# Patient Record
Sex: Female | Born: 1980 | Race: Black or African American | Hispanic: No | Marital: Single | Smoking: Never smoker
Health system: Southern US, Community
[De-identification: ages and names within clinical notes are randomized; demographics above are authoritative.]

## PROBLEM LIST (undated history)

## (undated) ENCOUNTER — Inpatient Hospital Stay (HOSPITAL_COMMUNITY): Payer: Self-pay

## (undated) DIAGNOSIS — A599 Trichomoniasis, unspecified: Secondary | ICD-10-CM

## (undated) DIAGNOSIS — Z789 Other specified health status: Secondary | ICD-10-CM

## (undated) DIAGNOSIS — I10 Essential (primary) hypertension: Secondary | ICD-10-CM

## (undated) DIAGNOSIS — R87619 Unspecified abnormal cytological findings in specimens from cervix uteri: Secondary | ICD-10-CM

## (undated) DIAGNOSIS — IMO0002 Reserved for concepts with insufficient information to code with codable children: Secondary | ICD-10-CM

## (undated) DIAGNOSIS — D219 Benign neoplasm of connective and other soft tissue, unspecified: Secondary | ICD-10-CM

## (undated) DIAGNOSIS — A749 Chlamydial infection, unspecified: Secondary | ICD-10-CM

## (undated) DIAGNOSIS — R87629 Unspecified abnormal cytological findings in specimens from vagina: Secondary | ICD-10-CM

## (undated) HISTORY — DX: Essential (primary) hypertension: I10

## (undated) HISTORY — DX: Unspecified abnormal cytological findings in specimens from vagina: R87.629

## (undated) HISTORY — DX: Benign neoplasm of connective and other soft tissue, unspecified: D21.9

---
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
## 2001-01-31 ENCOUNTER — Encounter: Payer: Self-pay | Admitting: Obstetrics and Gynecology

## 2001-01-31 ENCOUNTER — Ambulatory Visit (HOSPITAL_COMMUNITY): Admission: RE | Admit: 2001-01-31 | Discharge: 2001-01-31 | Payer: Self-pay | Admitting: Obstetrics and Gynecology

## 2001-03-27 ENCOUNTER — Other Ambulatory Visit: Admission: RE | Admit: 2001-03-27 | Discharge: 2001-03-27 | Payer: Self-pay | Admitting: Obstetrics and Gynecology

## 2001-06-25 ENCOUNTER — Inpatient Hospital Stay (HOSPITAL_COMMUNITY): Admission: AD | Admit: 2001-06-25 | Discharge: 2001-06-25 | Payer: Self-pay | Admitting: *Deleted

## 2001-06-29 ENCOUNTER — Inpatient Hospital Stay (HOSPITAL_COMMUNITY): Admission: AD | Admit: 2001-06-29 | Discharge: 2001-07-02 | Payer: Self-pay | Admitting: Obstetrics and Gynecology

## 2001-08-12 ENCOUNTER — Other Ambulatory Visit: Admission: RE | Admit: 2001-08-12 | Discharge: 2001-08-12 | Payer: Self-pay | Admitting: Obstetrics and Gynecology

## 2002-07-27 ENCOUNTER — Other Ambulatory Visit: Admission: RE | Admit: 2002-07-27 | Discharge: 2002-07-27 | Payer: Self-pay | Admitting: Obstetrics and Gynecology

## 2002-12-24 ENCOUNTER — Other Ambulatory Visit: Admission: RE | Admit: 2002-12-24 | Discharge: 2002-12-24 | Payer: Self-pay | Admitting: Gynecology

## 2003-01-24 ENCOUNTER — Inpatient Hospital Stay (HOSPITAL_COMMUNITY): Admission: AD | Admit: 2003-01-24 | Discharge: 2003-01-24 | Payer: Self-pay | Admitting: Family Medicine

## 2003-02-02 ENCOUNTER — Encounter: Admission: RE | Admit: 2003-02-02 | Discharge: 2003-02-02 | Payer: Self-pay | Admitting: *Deleted

## 2003-02-09 ENCOUNTER — Encounter: Admission: RE | Admit: 2003-02-09 | Discharge: 2003-02-09 | Payer: Self-pay | Admitting: *Deleted

## 2003-02-15 ENCOUNTER — Ambulatory Visit (HOSPITAL_COMMUNITY): Admission: RE | Admit: 2003-02-15 | Discharge: 2003-02-15 | Payer: Self-pay | Admitting: *Deleted

## 2003-02-23 ENCOUNTER — Encounter: Admission: RE | Admit: 2003-02-23 | Discharge: 2003-02-23 | Payer: Self-pay | Admitting: *Deleted

## 2003-03-02 ENCOUNTER — Inpatient Hospital Stay (HOSPITAL_COMMUNITY): Admission: AD | Admit: 2003-03-02 | Discharge: 2003-03-06 | Payer: Self-pay | Admitting: *Deleted

## 2003-03-02 ENCOUNTER — Encounter: Admission: RE | Admit: 2003-03-02 | Discharge: 2003-03-02 | Payer: Self-pay | Admitting: *Deleted

## 2003-09-29 ENCOUNTER — Emergency Department (HOSPITAL_COMMUNITY): Admission: EM | Admit: 2003-09-29 | Discharge: 2003-09-29 | Payer: Self-pay | Admitting: Emergency Medicine

## 2004-09-26 ENCOUNTER — Emergency Department (HOSPITAL_COMMUNITY): Admission: EM | Admit: 2004-09-26 | Discharge: 2004-09-27 | Payer: Self-pay | Admitting: Emergency Medicine

## 2005-08-08 ENCOUNTER — Ambulatory Visit (HOSPITAL_COMMUNITY): Admission: RE | Admit: 2005-08-08 | Discharge: 2005-08-08 | Payer: Self-pay | Admitting: Obstetrics & Gynecology

## 2006-01-09 ENCOUNTER — Inpatient Hospital Stay (HOSPITAL_COMMUNITY): Admission: AD | Admit: 2006-01-09 | Discharge: 2006-01-10 | Payer: Self-pay | Admitting: Obstetrics & Gynecology

## 2006-01-19 ENCOUNTER — Inpatient Hospital Stay (HOSPITAL_COMMUNITY): Admission: AD | Admit: 2006-01-19 | Discharge: 2006-01-21 | Payer: Self-pay | Admitting: Obstetrics & Gynecology

## 2012-10-15 NOTE — L&D Delivery Note (Signed)
I checked on the patient after I assumed care this morning. Upon my arrival, a Foley has been placed and drained more than a liter. The FHR had been reassuring but then dropped to the 60s and did not increase. I placed a scalp electrode to confirm the FHR. I then removed it and notified the OR and NICU. I placed a Kiwi vaccuum and delivered the baby girl after cutting a 3rd degree episiotomy. The Apgars and weight are pending. After delivery of the placenta, she had a EBL of a liter and the PPH protocol was begun. The bleeding subsided quickly. Her uterus was explored manually and noted to be empty. I repaired the 3rd degree in the usual fashion after assuring the the rectal mucosa was intact. 3-0 vicryl suture was used. She tolerated the procedure well. I will transfuse her 2 units of PRBCs.

## 2012-10-29 ENCOUNTER — Emergency Department (HOSPITAL_COMMUNITY)
Admission: EM | Admit: 2012-10-29 | Discharge: 2012-10-30 | Disposition: A | Discharge status: Home / Self Care | Payer: Self-pay | Attending: Emergency Medicine | Admitting: Emergency Medicine

## 2012-10-29 ENCOUNTER — Encounter (HOSPITAL_COMMUNITY): Payer: Self-pay | Admitting: *Deleted

## 2012-10-29 DIAGNOSIS — Z3490 Encounter for supervision of normal pregnancy, unspecified, unspecified trimester: Secondary | ICD-10-CM

## 2012-10-29 DIAGNOSIS — O209 Hemorrhage in early pregnancy, unspecified: Secondary | ICD-10-CM | POA: Insufficient documentation

## 2012-10-29 LAB — CBC
Hemoglobin: 10.4 g/dL — ABNORMAL LOW (ref 12.0–15.0)
MCH: 25.2 pg — ABNORMAL LOW (ref 26.0–34.0)
MCHC: 32.8 g/dL (ref 30.0–36.0)
MCV: 76.8 fL — ABNORMAL LOW (ref 78.0–100.0)
RBC: 4.13 MIL/uL (ref 3.87–5.11)

## 2012-10-29 LAB — URINALYSIS, ROUTINE W REFLEX MICROSCOPIC
Bilirubin Urine: NEGATIVE
Glucose, UA: NEGATIVE mg/dL
Ketones, ur: NEGATIVE mg/dL
Protein, ur: NEGATIVE mg/dL

## 2012-10-29 LAB — PREGNANCY, URINE: Preg Test, Ur: POSITIVE — AB

## 2012-10-29 LAB — URINE MICROSCOPIC-ADD ON

## 2012-10-29 NOTE — ED Notes (Signed)
Pt states positive pregnancy test Saturday; lmp beginning of December; continued cramping since last Wed; started spotting on Monday; heavy bleeding with possible clots today

## 2012-10-30 ENCOUNTER — Emergency Department (HOSPITAL_COMMUNITY): Payer: Self-pay

## 2012-10-30 LAB — WET PREP, GENITAL
Clue Cells Wet Prep HPF POC: NONE SEEN
Yeast Wet Prep HPF POC: NONE SEEN

## 2012-10-30 LAB — ABO/RH: ABO/RH(D): O POS

## 2012-10-30 LAB — TYPE AND SCREEN: Antibody Screen: NEGATIVE

## 2012-10-30 NOTE — ED Notes (Signed)
Pt transported to ultrasound per wheelchair

## 2012-10-30 NOTE — ED Provider Notes (Signed)
History     CSN: [id number]  Arrival date & time 10/29/12  2140   First MD Initiated Contact with Patient 10/29/12 2306      Chief Complaint  Patient presents with  . Vaginal Bleeding    (Consider location/radiation/quality/duration/timing/severity/associated sxs/prior treatment) HPI History provided by pt.   Pt developed lower abdominal cramping and vaginal spotting 1 week ago.  She thought her period was starting but bleeding continued to be minimal.  Took a positive pregnancy test over the weekend.  Today the abdominal cramping improved, but bleeding became heavy.  No other recent GU sx but has had N/V, daily, diffuse headaches, constant lightheadedness and intermittent SOB since onset of vaginal bleeding.  SOB is not necessarily exertional and denies CP, cough and fever.  No prior h/o ectopic.   History reviewed. No pertinent past medical history.  History reviewed. No pertinent past surgical history.  No family history on file.  History  Substance Use Topics  . Smoking status: Never Smoker   . Smokeless tobacco: Not on file  . Alcohol Use: No     Comment: social    OB History    Grav Para Term Preterm Abortions TAB SAB Ect Mult Living   1               Review of Systems  All other systems reviewed and are negative.    Allergies  Review of patient's allergies indicates no known allergies.  Home Medications   Current Outpatient Rx  Name  Route  Sig  Dispense  Refill  . IBUPROFEN 200 MG PO TABS   Oral   Take 200 mg by mouth every 8 (eight) hours as needed. For pain.           BP 137/80  Pulse 108  Temp 98.8 F (37.1 C) (Oral)  Resp 17  SpO2 100%  LMP 09/14/2012  Physical Exam  Nursing note and vitals reviewed. Constitutional: She is oriented to person, place, and time. She appears well-developed and well-nourished. No distress.  HENT:  Head: Normocephalic and atraumatic.  Eyes:       Normal appearance  Neck: Normal range of motion.    Cardiovascular: Normal rate and regular rhythm.   Pulmonary/Chest: Effort normal and breath sounds normal. No respiratory distress.  Abdominal: Soft. Bowel sounds are normal. She exhibits no distension.       Diffuse lower abd and suprapubic ttp  Genitourinary:       Nml external genitalia.  Physiologic vaginal discharge; no bleeding.  Cervix closed and appears nml.  Mild, bilateral adnexal tenderness and no cervical motion tenderness.   Musculoskeletal: Normal range of motion.  Neurological: She is alert and oriented to person, place, and time.  Skin: Skin is warm and dry. No rash noted.  Psychiatric: She has a normal mood and affect. Her behavior is normal.    ED Course  Procedures (including critical care time)   Date: 10/30/2012  Rate: 102  Rhythm: sinus tachycardia  QRS Axis: normal  Intervals: normal  ST/T Wave abnormalities: normal  Conduction Disutrbances:none  Narrative Interpretation:   Old EKG Reviewed: none available   Labs Reviewed  PREGNANCY, URINE - Abnormal; Notable for the following:    Preg Test, Ur POSITIVE (*)     All other components within normal limits  URINALYSIS, ROUTINE W REFLEX MICROSCOPIC - Abnormal; Notable for the following:    APPearance CLOUDY (*)     Leukocytes, UA SMALL (*)     All  other components within normal limits  HCG, QUANTITATIVE, PREGNANCY - Abnormal; Notable for the following:    hCG, Beta Chain, Quant, S 75280 (*)     All other components within normal limits  CBC - Abnormal; Notable for the following:    Hemoglobin 10.4 (*)     HCT 31.7 (*)     MCV 76.8 (*)     MCH 25.2 (*)     RDW 16.8 (*)     All other components within normal limits  WET PREP, GENITAL - Abnormal; Notable for the following:    Trich, Wet Prep MODERATE (*)     WBC, Wet Prep HPF POC FEW (*)     All other components within normal limits  ABO/RH  TYPE AND SCREEN  URINE MICROSCOPIC-ADD ON  GC/CHLAMYDIA PROBE AMP   US Ob Comp Less 14 Wks  10/30/2012   *RADIOLOGY REPORT*  Clinical Data: Vaginal bleeding.  Pregnant.  TRANSVAGINAL OB ULTRASOUND,ULTRASOUND OB COMPLETE LESS 14 WEEKS ADD'L GEST,OBSTETRIC <14 WK ULTRASOUND  Technique:  Both transabdominal and transvaginal ultrasound examinations were performed for complete evaluation of the gestation as well as the maternal uterus, adnexal regions, and pelvic cul-de-sac.  Transvaginal technique was performed to assess early pregnancy.  Comparison:  None.  Intrauterine gestational sac:  There are two, labeled A on the right and B on the left.  Baby A: Yolk sac: Present Embryo: Visualized Cardiac Activity: Visualized Heart Rate: 127 bpm CRL: 4.1  mm  6 w  1 d        [place] EDC: 06/24/2013  Baby B: Yolk sac: Present Embryo: Visualized Cardiac Activity: Visualized Heart Rate: 123   bpm CRL:  4.1 mm  6 w  1 d        [place] EDC: 06/24/2013  Maternal uterus/Adnexae: Small subchorionic hemorrhage suspected.  Right ovary 31 x 43 mm with a probable corpus luteum cyst.  Left ovary 24 x 26 x 42 mm, unremarkable. No free fluid.  IMPRESSION:  Multiple gestation as above, with small subchorionic hemorrhage.  Maternal uterus/adnexae:  IMPRESSION:   Original Report Authenticated By: D. Andria Rhein, MD    US Ob Comp Addl Gest Less 14 Wks  10/30/2012  *RADIOLOGY REPORT*  Clinical Data: Vaginal bleeding.  Pregnant.  TRANSVAGINAL OB ULTRASOUND,ULTRASOUND OB COMPLETE LESS 14 WEEKS ADD'L GEST,OBSTETRIC <14 WK ULTRASOUND  Technique:  Both transabdominal and transvaginal ultrasound examinations were performed for complete evaluation of the gestation as well as the maternal uterus, adnexal regions, and pelvic cul-de-sac.  Transvaginal technique was performed to assess early pregnancy.  Comparison:  None.  Intrauterine gestational sac:  There are two, labeled A on the right and B on the left.  Baby A: Yolk sac: Present Embryo: Visualized Cardiac Activity: Visualized Heart Rate: 127 bpm CRL: 4.1  mm  6 w  1 d        [place] EDC: 06/24/2013  Baby B: Yolk sac:  Present Embryo: Visualized Cardiac Activity: Visualized Heart Rate: 123   bpm CRL:  4.1 mm  6 w  1 d        [place] EDC: 06/24/2013  Maternal uterus/Adnexae: Small subchorionic hemorrhage suspected.  Right ovary 31 x 43 mm with a probable corpus luteum cyst.  Left ovary 24 x 26 x 42 mm, unremarkable. No free fluid.  IMPRESSION:  Multiple gestation as above, with small subchorionic hemorrhage.  Maternal uterus/adnexae:  IMPRESSION:   Original Report Authenticated By: D. Andria Rhein, MD    US Ob Transvaginal  10/30/2012  *RADIOLOGY REPORT*  Clinical Data: Vaginal bleeding.  Pregnant.  TRANSVAGINAL OB ULTRASOUND,ULTRASOUND OB COMPLETE LESS 14 WEEKS ADD'L GEST,OBSTETRIC <14 WK ULTRASOUND  Technique:  Both transabdominal and transvaginal ultrasound examinations were performed for complete evaluation of the gestation as well as the maternal uterus, adnexal regions, and pelvic cul-de-sac.  Transvaginal technique was performed to assess early pregnancy.  Comparison:  None.  Intrauterine gestational sac:  There are two, labeled A on the right and B on the left.  Baby A: Yolk sac: Present Embryo: Visualized Cardiac Activity: Visualized Heart Rate: 127 bpm CRL: 4.1  mm  6 w  1 d        [place] EDC: 06/24/2013  Baby B: Yolk sac: Present Embryo: Visualized Cardiac Activity: Visualized Heart Rate: 123   bpm CRL:  4.1 mm  6 w  1 d        [place] EDC: 06/24/2013  Maternal uterus/Adnexae: Small subchorionic hemorrhage suspected.  Right ovary 31 x 43 mm with a probable corpus luteum cyst.  Left ovary 24 x 26 x 42 mm, unremarkable. No free fluid.  IMPRESSION:  Multiple gestation as above, with small subchorionic hemorrhage.  Maternal uterus/adnexae:  IMPRESSION:   Original Report Authenticated By: D. Andria Rhein, MD      1. Pregnancy       MDM  31yo pregnant F presents w/ vaginal bleeding and abdominal cramping.  Has had lightheadedness, SOB, headaches, N/V as well.  No vaginal bleeding and diffuse lower abd and bilateral adnexal ttp on  exam.  EKG non-ischemic and shows mild sinus tach.  Labs sig for hcg 75,280 and vaginal trich.  Will not treat trich because patient is asymptomatic and risk greater than benefit.  Transvaginal US pending.  12:10 AM   Vital signs have normalized.  US shows multiple gestation w/ possible small subchorionic hemorrhage.  Patient is aware.  I recommended close f/u with her OB.  Return precautions discussed.  She knows that [facility] has an ED.  2:04 AM         Otilio Miu, PA-C 10/30/12 [id number]

## 2012-10-31 LAB — GC/CHLAMYDIA PROBE AMP: GC Probe RNA: NEGATIVE

## 2012-11-01 NOTE — ED Notes (Signed)
+  Chlamydia. Chart sent to EDP office for review. DHHS attached. 

## 2012-11-03 NOTE — ED Provider Notes (Signed)
Medical screening examination/treatment/procedure(s) were performed by non-physician practitioner and as supervising physician I was immediately available for consultation/collaboration.   Richardean Canal, MD 11/03/12 1515

## 2012-11-04 NOTE — ED Notes (Signed)
Chart sent to EDP office for review. rx for Zithromax 2 gam po written by Jonathon Jordan.

## 2012-12-16 ENCOUNTER — Inpatient Hospital Stay (HOSPITAL_COMMUNITY)
Admission: AD | Admit: 2012-12-16 | Discharge: 2012-12-16 | Disposition: A | Discharge status: Home / Self Care | Payer: Self-pay | Source: Ambulatory Visit | Attending: Obstetrics & Gynecology | Admitting: Obstetrics & Gynecology

## 2012-12-16 ENCOUNTER — Encounter (HOSPITAL_COMMUNITY): Payer: Self-pay | Admitting: *Deleted

## 2012-12-16 ENCOUNTER — Inpatient Hospital Stay (HOSPITAL_COMMUNITY): Payer: Self-pay

## 2012-12-16 DIAGNOSIS — O30009 Twin pregnancy, unspecified number of placenta and unspecified number of amniotic sacs, unspecified trimester: Secondary | ICD-10-CM | POA: Insufficient documentation

## 2012-12-16 DIAGNOSIS — O418X20 Other specified disorders of amniotic fluid and membranes, second trimester, not applicable or unspecified: Secondary | ICD-10-CM

## 2012-12-16 DIAGNOSIS — M259 Joint disorder, unspecified: Secondary | ICD-10-CM | POA: Insufficient documentation

## 2012-12-16 DIAGNOSIS — N760 Acute vaginitis: Secondary | ICD-10-CM | POA: Insufficient documentation

## 2012-12-16 DIAGNOSIS — M5430 Sciatica, unspecified side: Secondary | ICD-10-CM | POA: Insufficient documentation

## 2012-12-16 DIAGNOSIS — M899 Disorder of bone, unspecified: Secondary | ICD-10-CM | POA: Insufficient documentation

## 2012-12-16 DIAGNOSIS — A499 Bacterial infection, unspecified: Secondary | ICD-10-CM | POA: Insufficient documentation

## 2012-12-16 DIAGNOSIS — A5901 Trichomonal vulvovaginitis: Secondary | ICD-10-CM | POA: Insufficient documentation

## 2012-12-16 DIAGNOSIS — O98819 Other maternal infectious and parasitic diseases complicating pregnancy, unspecified trimester: Secondary | ICD-10-CM | POA: Insufficient documentation

## 2012-12-16 DIAGNOSIS — M5432 Sciatica, left side: Secondary | ICD-10-CM

## 2012-12-16 DIAGNOSIS — O209 Hemorrhage in early pregnancy, unspecified: Secondary | ICD-10-CM | POA: Insufficient documentation

## 2012-12-16 DIAGNOSIS — O2390 Unspecified genitourinary tract infection in pregnancy, unspecified trimester: Secondary | ICD-10-CM | POA: Insufficient documentation

## 2012-12-16 DIAGNOSIS — B9689 Other specified bacterial agents as the cause of diseases classified elsewhere: Secondary | ICD-10-CM | POA: Insufficient documentation

## 2012-12-16 HISTORY — DX: Unspecified abnormal cytological findings in specimens from cervix uteri: R87.619

## 2012-12-16 HISTORY — DX: Reserved for concepts with insufficient information to code with codable children: IMO0002

## 2012-12-16 HISTORY — DX: Chlamydial infection, unspecified: A74.9

## 2012-12-16 HISTORY — DX: Trichomoniasis, unspecified: A59.9

## 2012-12-16 LAB — URINALYSIS, ROUTINE W REFLEX MICROSCOPIC
Bilirubin Urine: NEGATIVE
Glucose, UA: NEGATIVE mg/dL
Specific Gravity, Urine: 1.025 (ref 1.005–1.030)
pH: 6 (ref 5.0–8.0)

## 2012-12-16 LAB — URINE MICROSCOPIC-ADD ON

## 2012-12-16 LAB — WET PREP, GENITAL: Yeast Wet Prep HPF POC: NONE SEEN

## 2012-12-16 LAB — GC/CHLAMYDIA PROBE AMP: CT Probe RNA: NEGATIVE

## 2012-12-16 MED ORDER — CYCLOBENZAPRINE HCL 10 MG PO TABS: 10.0000 mg | ORAL_TABLET | Freq: Two times a day (BID) | ORAL | Status: DC | PRN

## 2012-12-16 MED ORDER — METRONIDAZOLE 500 MG PO TABS: 500.0000 mg | ORAL_TABLET | Freq: Two times a day (BID) | ORAL | Status: DC

## 2012-12-16 NOTE — MAU Provider Note (Signed)
  History     CSN: [id number]  Arrival date and time: 12/16/12 0705   None     Chief Complaint  Patient presents with  . Vaginal Bleeding   HPI  Pt is [id number] with twin gestation presents with complaint of bright red vaginal discharge noticed this morning.  She also had some cramping.  Pt denies pain with urination, constipation, diarrhea.  Pt had trichomonas when she was seen at [facility] ED but does not appear that she was treated. Pt was called after her visit and told she had chlamydia- which she took 4 pills for and has not had sex since. Pt did not know that she had trichomonas.  Pt also has left hip pain radiating down her leg.  Tylenol helps a little but makes her sleepy.  She works at [employer]. Pt is waiting on Medicaid - would like to come to [facility] clinic who took care of her last pregnancy.  Past Medical History  Diagnosis Date  . Abnormal Pap smear   . Chlamydia     Past Surgical History  Procedure Laterality Date  . Cesarean section      Family History  Problem Relation Age of Onset  . Kidney disease Mother   . Heart disease Son   . Diabetes Maternal Aunt     History  Substance Use Topics  . Smoking status: Never Smoker   . Smokeless tobacco: Never Used  . Alcohol Use: Yes     Comment: social    Allergies: No Known Allergies  No prescriptions prior to admission    Review of Systems  Constitutional: Negative for fever and chills.  Gastrointestinal: Negative for heartburn, nausea, vomiting, abdominal pain, diarrhea and constipation.  Genitourinary: Negative for dysuria and urgency.  Musculoskeletal: Positive for back pain.   Physical Exam   Blood pressure 130/77, pulse 116, temperature 99.2 F (37.3 C), temperature source Oral, resp. rate 18, height 5\' 9"  (1.753 m), weight 225 lb (102.059 kg), last menstrual period 10/01/2012.  Physical Exam  Nursing note and vitals reviewed. Constitutional: She is oriented to person, place, and time. She appears  well-developed and well-nourished. No distress.  HENT:  Head: Normocephalic.  Eyes: Pupils are equal, round, and reactive to light.  Neck: Normal range of motion. Neck supple.  Cardiovascular: Normal rate.   Respiratory: Effort normal.  GI: Soft. There is no tenderness. There is no rebound and no guarding.  Genitourinary:  Mod amount of opaque pink vaginal discharge in vault; cervix clean, closed, NT; uterus difficult to evaluate due to habitus-NT  Musculoskeletal: Normal range of motion.  Neurological: She is alert and oriented to person, place, and time.  Skin: Skin is warm and dry.  Psychiatric: She has a normal mood and affect.    MAU Course  Procedures     Assessment and Plan  [facility] Trichomonas vaginitis and BV- prescription for Flagyl 500mg  BID for 7 days Sciatica- prescription for Flexeril 10 mg TID prn- stretching exercises F/u in [facility] OB clinic- clinic to call pt with an appointment  LINEBERRY,SUSAN 12/16/2012, 8:16 AM

## 2012-12-16 NOTE — MAU Note (Signed)
Noted bloody D/C when she woke up around 0530. Has had bleeding previously with this pregnancy. Not seen in hospital until now. No cramping.

## 2012-12-16 NOTE — MAU Note (Addendum)
Now states went to [facility] about 1 month ago for N/V. Had U/S, was given due date of 06/24/2013, Twins.

## 2012-12-23 ENCOUNTER — Other Ambulatory Visit: Payer: Self-pay

## 2012-12-30 ENCOUNTER — Other Ambulatory Visit: Payer: Self-pay

## 2012-12-31 ENCOUNTER — Other Ambulatory Visit: Payer: Self-pay

## 2012-12-31 DIAGNOSIS — Z3490 Encounter for supervision of normal pregnancy, unspecified, unspecified trimester: Secondary | ICD-10-CM

## 2013-01-01 ENCOUNTER — Encounter: Payer: Self-pay | Admitting: Family Medicine

## 2013-01-01 DIAGNOSIS — Z2839 Other underimmunization status: Secondary | ICD-10-CM | POA: Insufficient documentation

## 2013-01-01 DIAGNOSIS — D649 Anemia, unspecified: Secondary | ICD-10-CM | POA: Insufficient documentation

## 2013-01-01 DIAGNOSIS — Z283 Underimmunization status: Secondary | ICD-10-CM | POA: Insufficient documentation

## 2013-01-01 DIAGNOSIS — O9989 Other specified diseases and conditions complicating pregnancy, childbirth and the puerperium: Secondary | ICD-10-CM

## 2013-01-01 LAB — OBSTETRIC PANEL
Basophils Absolute: 0 10*3/uL (ref 0.0–0.1)
Hepatitis B Surface Ag: NEGATIVE
Lymphocytes Relative: 38 % (ref 12–46)
Lymphs Abs: 2.9 10*3/uL (ref 0.7–4.0)
Neutro Abs: 4.1 10*3/uL (ref 1.7–7.7)
Neutrophils Relative %: 53 % (ref 43–77)
Platelets: 396 10*3/uL (ref 150–400)
RBC: 3.79 MIL/uL — ABNORMAL LOW (ref 3.87–5.11)
RDW: 18.4 % — ABNORMAL HIGH (ref 11.5–15.5)
Rubella: 8.26 Index — ABNORMAL HIGH (ref <0.90)
WBC: 7.7 10*3/uL (ref 4.0–10.5)

## 2013-01-01 LAB — HIV ANTIBODY (ROUTINE TESTING W REFLEX): HIV: NONREACTIVE

## 2013-01-02 LAB — HEMOGLOBINOPATHY EVALUATION
Hemoglobin Other: 0 %
Hgb F Quant: 0 % (ref 0.0–2.0)
Hgb S Quant: 0 %

## 2013-01-07 ENCOUNTER — Encounter: Payer: Self-pay | Admitting: Obstetrics and Gynecology

## 2013-01-07 ENCOUNTER — Encounter: Payer: Self-pay | Admitting: Advanced Practice Midwife

## 2013-01-21 ENCOUNTER — Encounter (HOSPITAL_COMMUNITY): Payer: Self-pay | Admitting: *Deleted

## 2013-01-21 ENCOUNTER — Inpatient Hospital Stay (HOSPITAL_COMMUNITY)
Admission: AD | Admit: 2013-01-21 | Discharge: 2013-01-21 | Disposition: A | Discharge status: Home / Self Care | Payer: Medicaid Other | Source: Ambulatory Visit | Attending: Obstetrics & Gynecology | Admitting: Obstetrics & Gynecology

## 2013-01-21 ENCOUNTER — Inpatient Hospital Stay (HOSPITAL_COMMUNITY): Payer: Medicaid Other

## 2013-01-21 ENCOUNTER — Encounter: Payer: Self-pay | Admitting: Obstetrics & Gynecology

## 2013-01-21 DIAGNOSIS — W010XXA Fall on same level from slipping, tripping and stumbling without subsequent striking against object, initial encounter: Secondary | ICD-10-CM | POA: Insufficient documentation

## 2013-01-21 DIAGNOSIS — O26899 Other specified pregnancy related conditions, unspecified trimester: Secondary | ICD-10-CM

## 2013-01-21 DIAGNOSIS — Z283 Underimmunization status: Secondary | ICD-10-CM

## 2013-01-21 DIAGNOSIS — Y9289 Other specified places as the place of occurrence of the external cause: Secondary | ICD-10-CM | POA: Insufficient documentation

## 2013-01-21 DIAGNOSIS — O30009 Twin pregnancy, unspecified number of placenta and unspecified number of amniotic sacs, unspecified trimester: Secondary | ICD-10-CM | POA: Insufficient documentation

## 2013-01-21 DIAGNOSIS — O9989 Other specified diseases and conditions complicating pregnancy, childbirth and the puerperium: Secondary | ICD-10-CM

## 2013-01-21 DIAGNOSIS — O0930 Supervision of pregnancy with insufficient antenatal care, unspecified trimester: Secondary | ICD-10-CM | POA: Insufficient documentation

## 2013-01-21 DIAGNOSIS — R109 Unspecified abdominal pain: Secondary | ICD-10-CM | POA: Insufficient documentation

## 2013-01-21 DIAGNOSIS — O99891 Other specified diseases and conditions complicating pregnancy: Secondary | ICD-10-CM | POA: Insufficient documentation

## 2013-01-21 LAB — URINALYSIS, ROUTINE W REFLEX MICROSCOPIC
Hgb urine dipstick: NEGATIVE
Nitrite: NEGATIVE
Protein, ur: NEGATIVE mg/dL
Urobilinogen, UA: 0.2 mg/dL (ref 0.0–1.0)

## 2013-01-21 LAB — URINE MICROSCOPIC-ADD ON

## 2013-01-21 NOTE — MAU Note (Signed)
Patient states she slipped getting into her SUV and hit her abdomen on the seat control. Started having abdominal pain after. Denies bleeding or vaginal discharge. Has had no prenatal care yet. Has an appointment at the [facility] on 4-21.

## 2013-01-21 NOTE — MAU Note (Signed)
Pt states today about 1500 she was getting in the SUV when her foot slid off of stepboard and fell against controls on side of seat to abd.  Pt started having abd pain about 10 min later only on rt side of abd.  Denies vaginal bleeding or ROM.  Pt hasn't been feeling any fetal movement even prior to slip today.

## 2013-01-21 NOTE — MAU Provider Note (Signed)
  History     CSN: [id number]  Arrival date and time: 01/21/13 1750   First Provider Initiated Contact with Patient 01/21/13 1916      Chief Complaint  Patient presents with  . Fall   HPI  Pt is a [id number] at 18.0 wks IUP here with report of falling today.  Reports while stepping into car foot slipped and abdomen struck the side of chair.  No report of vaginal bleeding or leaking of fluid.  Slight abdominal tenderness.  First prenatal care appt is on 02/02/13.  Currently pregnancy with di/di twins.      Past Medical History  Diagnosis Date  . Abnormal Pap smear   . Chlamydia   . Trichomonas     January 2014  . Sciatica     Past Surgical History  Procedure Laterality Date  . Cesarean section      Family History  Problem Relation Age of Onset  . Kidney disease Mother   . Heart disease Son   . Diabetes Maternal Aunt     History  Substance Use Topics  . Smoking status: Never Smoker   . Smokeless tobacco: Never Used  . Alcohol Use: No     Comment: social    Allergies: No Known Allergies  Prescriptions prior to admission  Medication Sig Dispense Refill  . cyclobenzaprine (FLEXERIL) 10 MG tablet Take 1 tablet (10 mg total) by mouth 2 (two) times daily as needed for muscle spasms.  20 tablet  0  . metroNIDAZOLE (FLAGYL) 500 MG tablet Take 1 tablet (500 mg total) by mouth 2 (two) times daily.  14 tablet  0    Review of Systems  Gastrointestinal: Positive for abdominal pain (slight abdominal tenderness).  All other systems reviewed and are negative.   Physical Exam   Blood pressure 121/80, temperature 99 F (37.2 C), temperature source Oral, resp. rate 20, height 5' 7.5" (1.715 m), weight 103.874 kg (229 lb), last menstrual period 10/01/2012, SpO2 100.00%.  Physical Exam  Constitutional: She is oriented to person, place, and time. She appears well-developed and well-nourished.  HENT:  Head: Normocephalic.  Neck: Normal range of motion. Neck supple.   Cardiovascular: Normal rate, regular rhythm and normal heart sounds.   Respiratory: Effort normal and breath sounds normal.  GI: Soft. There is no tenderness (mild generalized tenderness).  Genitourinary: No bleeding around the vagina. Vaginal discharge (mucusy) found.  Neurological: She is alert and oriented to person, place, and time.  Skin: Skin is warm and dry.   Dilation: Closed Effacement (%): Thick Cervical Position: Posterior Exam by:: Roney Marion, CNM   MAU Course  Procedures    Assessment and Plan  Report given to H. Mathews Robinsons who assumes care of patient.  [facility] 01/21/2013, 7:17 PM

## 2013-01-21 NOTE — MAU Note (Signed)
Patient is having twins. 

## 2013-02-02 ENCOUNTER — Ambulatory Visit (INDEPENDENT_AMBULATORY_CARE_PROVIDER_SITE_OTHER): Payer: Medicaid Other | Admitting: Obstetrics & Gynecology

## 2013-02-02 ENCOUNTER — Other Ambulatory Visit: Payer: Self-pay | Admitting: Obstetrics & Gynecology

## 2013-02-02 ENCOUNTER — Encounter: Payer: Self-pay | Admitting: Obstetrics & Gynecology

## 2013-02-02 VITALS — BP 144/91 | Temp 99.4°F | Wt 228.0 lb

## 2013-02-02 DIAGNOSIS — A499 Bacterial infection, unspecified: Secondary | ICD-10-CM

## 2013-02-02 DIAGNOSIS — Z283 Underimmunization status: Secondary | ICD-10-CM

## 2013-02-02 DIAGNOSIS — O139 Gestational [pregnancy-induced] hypertension without significant proteinuria, unspecified trimester: Secondary | ICD-10-CM

## 2013-02-02 DIAGNOSIS — B9689 Other specified bacterial agents as the cause of diseases classified elsewhere: Secondary | ICD-10-CM

## 2013-02-02 DIAGNOSIS — N760 Acute vaginitis: Secondary | ICD-10-CM

## 2013-02-02 DIAGNOSIS — O169 Unspecified maternal hypertension, unspecified trimester: Secondary | ICD-10-CM | POA: Insufficient documentation

## 2013-02-02 DIAGNOSIS — O30009 Twin pregnancy, unspecified number of placenta and unspecified number of amniotic sacs, unspecified trimester: Secondary | ICD-10-CM

## 2013-02-02 DIAGNOSIS — O9989 Other specified diseases and conditions complicating pregnancy, childbirth and the puerperium: Secondary | ICD-10-CM

## 2013-02-02 DIAGNOSIS — O162 Unspecified maternal hypertension, second trimester: Secondary | ICD-10-CM

## 2013-02-02 LAB — POCT URINALYSIS DIP (DEVICE)
Bilirubin Urine: NEGATIVE
Ketones, ur: NEGATIVE mg/dL
Leukocytes, UA: NEGATIVE
Specific Gravity, Urine: 1.02 (ref 1.005–1.030)
pH: 7 (ref 5.0–8.0)

## 2013-02-02 LAB — COMPREHENSIVE METABOLIC PANEL
ALT: 77 U/L — ABNORMAL HIGH (ref 0–35)
AST: 52 U/L — ABNORMAL HIGH (ref 0–37)
Chloride: 106 mEq/L (ref 96–112)
Creat: 0.5 mg/dL (ref 0.50–1.10)
Total Bilirubin: 0.2 mg/dL — ABNORMAL LOW (ref 0.3–1.2)

## 2013-02-02 LAB — CBC
HCT: 28 % — ABNORMAL LOW (ref 36.0–46.0)
MCH: 24.7 pg — ABNORMAL LOW (ref 26.0–34.0)
MCHC: 33.2 g/dL (ref 30.0–36.0)
MCV: 74.5 fL — ABNORMAL LOW (ref 78.0–100.0)
RDW: 19.3 % — ABNORMAL HIGH (ref 11.5–15.5)

## 2013-02-02 NOTE — Progress Notes (Signed)
  Subjective:    Meredith Chen is a [id number] [redacted]w[redacted]d with di/di twins being seen today for her first obstetrical visit.  Pregnancy history fully reviewed.  Patient reports no complaints.  Filed Vitals:   02/02/13 1311  BP: 144/91  Temp: 99.4 F (37.4 C)  Weight: 228 lb (103.42 kg)    HISTORY: OB History   Grav Para Term Preterm Abortions TAB SAB Ect Mult Living   5 3 3  1 1    3      # Outc Date GA Lbr Len/2nd Wgt Sex Del Anes PTL Lv   1 TAB 2001           2 TRM 9/02 [redacted]w[redacted]d  8lb4oz(3.742kg) F CS EPI  Yes   3 TRM 5/04 [redacted]w[redacted]d  7lb4oz(3.289kg) M SVD EPI  Yes   Comments: VBAC   4 TRM 4/07 [redacted]w[redacted]d 01:00 6lb7oz(2.92kg) M SVD None  Yes   5 CUR              Past Medical History  Diagnosis Date  . Abnormal Pap smear   . Chlamydia   . Trichomonas     January 2014   Past Surgical History  Procedure Laterality Date  . Cesarean section     Family History  Problem Relation Age of Onset  . Kidney disease Mother   . Heart disease Son   . Diabetes Maternal Aunt      Exam    Uterus:     Pelvic Exam:    Perineum: No Hemorrhoids, Normal Perineum   Vulva: normal   Vagina:  normal mucosa, curdlike discharge, wet prep done   Cervix: multiparous appearance and spotting after pap   Adnexa: normal adnexa and no mass, fullness, tenderness   Bony Pelvis: average  System: Breast:  normal appearance, no masses or tenderness   Skin: normal coloration and turgor, no rashes   Neurologic: oriented, normal   Extremities: normal strength, tone, and muscle mass   HEENT PERRLA   Mouth/Teeth mucous membranes moist, pharynx normal without lesions and dental hygiene good   Neck supple and no masses   Cardiovascular: regular rate and rhythm   Respiratory:  appears well, vitals normal, no respiratory distress, acyanotic, normal RR   Abdomen: soft, non-tender; bowel sounds normal; no masses,  no organomegaly   Urinary: urethral meatus normal    Assessment:    Pregnancy: [id number] Patient Active  Problem List  Diagnosis  . Rubella non-immune status, antepartum  . Anemia  . Di/Di twins, antepartum  . Elevated blood pressure complicating pregnancy, antepartum      Plan:     Prenatal labs reviewed; BP labs drawn Continue prenatal vitamins. Problem list reviewed and updated. Genetic Screening discussed Quad Screen: ordered. Ultrasound discussed; fetal survey: ordered. Follow up in 2 weeks.   Eliga Arvie A 02/02/2013

## 2013-02-02 NOTE — Patient Instructions (Signed)
Return to clinic for any obstetric concerns or go to MAU for evaluation  

## 2013-02-02 NOTE — Progress Notes (Signed)
Pulse- 117  Edema-feet  Pain-back  Pressure-vaginal  New ob packet given Weight gain of 25-35lb  Flu vaccine declined

## 2013-02-03 ENCOUNTER — Telehealth: Payer: Self-pay | Admitting: *Deleted

## 2013-02-03 ENCOUNTER — Encounter: Payer: Self-pay | Admitting: Obstetrics & Gynecology

## 2013-02-03 DIAGNOSIS — O26619 Liver and biliary tract disorders in pregnancy, unspecified trimester: Secondary | ICD-10-CM | POA: Insufficient documentation

## 2013-02-03 LAB — WET PREP, GENITAL: Yeast Wet Prep HPF POC: NONE SEEN

## 2013-02-03 LAB — CULTURE, OB URINE: Colony Count: NO GROWTH

## 2013-02-03 MED ORDER — METRONIDAZOLE 500 MG PO TABS: 500.0000 mg | ORAL_TABLET | Freq: Two times a day (BID) | ORAL | Status: AC

## 2013-02-03 NOTE — Telephone Encounter (Addendum)
Message copied by Jill Side on Tue Feb 03, 2013  2:44 PM ------      Message from: Jaynie Collins A      Created: Tue Feb 03, 2013  2:22 PM       Patient has significant BV, Metronidazole prescribed.  Please call to inform patient of results and to pick up prescription.       ------ Called pt and informed her of +BV requiring medication. She can pick up her Rx today. Pt also asked about additional tests performed yesterday in light of her elevated BP. I stated that I will contact Dr. Macon Large to review the results and then call her back. Pt voiced understanding.

## 2013-02-03 NOTE — Addendum Note (Signed)
Addended by: Jaynie Collins A on: 02/03/2013 02:21 PM   Modules accepted: Orders

## 2013-02-05 ENCOUNTER — Ambulatory Visit (HOSPITAL_COMMUNITY)
Admission: RE | Admit: 2013-02-05 | Discharge: 2013-02-05 | Disposition: A | Discharge status: Home / Self Care | Payer: Medicaid Other | Source: Ambulatory Visit | Attending: Obstetrics & Gynecology | Admitting: Obstetrics & Gynecology

## 2013-02-05 DIAGNOSIS — Z363 Encounter for antenatal screening for malformations: Secondary | ICD-10-CM | POA: Insufficient documentation

## 2013-02-05 DIAGNOSIS — O358XX0 Maternal care for other (suspected) fetal abnormality and damage, not applicable or unspecified: Secondary | ICD-10-CM | POA: Insufficient documentation

## 2013-02-05 DIAGNOSIS — Z1389 Encounter for screening for other disorder: Secondary | ICD-10-CM | POA: Insufficient documentation

## 2013-02-05 DIAGNOSIS — O30009 Twin pregnancy, unspecified number of placenta and unspecified number of amniotic sacs, unspecified trimester: Secondary | ICD-10-CM | POA: Insufficient documentation

## 2013-02-06 LAB — HEPATITIS PANEL, ACUTE
HCV Ab: NEGATIVE
Hep A IgM: NEGATIVE

## 2013-02-09 ENCOUNTER — Telehealth: Payer: Self-pay | Admitting: *Deleted

## 2013-02-09 ENCOUNTER — Encounter: Payer: Self-pay | Admitting: Obstetrics & Gynecology

## 2013-02-09 NOTE — Telephone Encounter (Signed)
Called pt and informed her of negative hepatitis panel labs. Also informed her of slightly abnormal liver function studies which will be re-checked @ next visit on 5/5. There are no additional tests needed prior to that date. Pt voiced understanding.

## 2013-02-09 NOTE — Telephone Encounter (Signed)
Message copied by Jill Side on Mon Feb 09, 2013  3:26 PM ------      Message from: Jaynie Collins A      Created: Mon Feb 09, 2013  1:06 PM       Negative hepatitis panel, will recheck LFTs at next visit and continue to monitor BP.  Please call to inform patient of results and recommendations.       ------

## 2013-02-11 ENCOUNTER — Encounter: Payer: Self-pay | Admitting: *Deleted

## 2013-02-11 ENCOUNTER — Encounter: Payer: Self-pay | Admitting: Obstetrics & Gynecology

## 2013-02-16 ENCOUNTER — Ambulatory Visit (INDEPENDENT_AMBULATORY_CARE_PROVIDER_SITE_OTHER): Payer: Medicaid Other | Admitting: Obstetrics and Gynecology

## 2013-02-16 ENCOUNTER — Other Ambulatory Visit: Payer: Self-pay | Admitting: Obstetrics and Gynecology

## 2013-02-16 ENCOUNTER — Encounter: Payer: Self-pay | Admitting: Obstetrics and Gynecology

## 2013-02-16 VITALS — BP 130/85 | Temp 97.1°F | Wt 230.7 lb

## 2013-02-16 DIAGNOSIS — O9989 Other specified diseases and conditions complicating pregnancy, childbirth and the puerperium: Secondary | ICD-10-CM

## 2013-02-16 DIAGNOSIS — O30009 Twin pregnancy, unspecified number of placenta and unspecified number of amniotic sacs, unspecified trimester: Secondary | ICD-10-CM

## 2013-02-16 DIAGNOSIS — Z283 Underimmunization status: Secondary | ICD-10-CM

## 2013-02-16 DIAGNOSIS — O139 Gestational [pregnancy-induced] hypertension without significant proteinuria, unspecified trimester: Secondary | ICD-10-CM

## 2013-02-16 DIAGNOSIS — O162 Unspecified maternal hypertension, second trimester: Secondary | ICD-10-CM

## 2013-02-16 DIAGNOSIS — R7989 Other specified abnormal findings of blood chemistry: Secondary | ICD-10-CM

## 2013-02-16 LAB — COMPREHENSIVE METABOLIC PANEL
ALT: 50 U/L — ABNORMAL HIGH (ref 0–35)
CO2: 20 mEq/L (ref 19–32)
Chloride: 105 mEq/L (ref 96–112)
Sodium: 135 mEq/L (ref 135–145)
Total Bilirubin: 0.2 mg/dL — ABNORMAL LOW (ref 0.3–1.2)
Total Protein: 6.9 g/dL (ref 6.0–8.3)

## 2013-02-16 LAB — POCT URINALYSIS DIP (DEVICE)
Bilirubin Urine: NEGATIVE
Ketones, ur: NEGATIVE mg/dL
Protein, ur: NEGATIVE mg/dL

## 2013-02-16 NOTE — Progress Notes (Signed)
Patient doing well without complaints. Discussed need to repeat LFTs today.

## 2013-02-17 ENCOUNTER — Telehealth: Payer: Self-pay | Admitting: *Deleted

## 2013-02-17 ENCOUNTER — Inpatient Hospital Stay (HOSPITAL_COMMUNITY)
Admission: AD | Admit: 2013-02-17 | Discharge: 2013-02-17 | Disposition: A | Discharge status: Home / Self Care | Payer: Medicaid Other | Source: Ambulatory Visit | Attending: Obstetrics & Gynecology | Admitting: Obstetrics & Gynecology

## 2013-02-17 ENCOUNTER — Encounter (HOSPITAL_COMMUNITY): Payer: Self-pay | Admitting: *Deleted

## 2013-02-17 DIAGNOSIS — O30009 Twin pregnancy, unspecified number of placenta and unspecified number of amniotic sacs, unspecified trimester: Secondary | ICD-10-CM | POA: Insufficient documentation

## 2013-02-17 DIAGNOSIS — O99891 Other specified diseases and conditions complicating pregnancy: Secondary | ICD-10-CM | POA: Insufficient documentation

## 2013-02-17 DIAGNOSIS — O26899 Other specified pregnancy related conditions, unspecified trimester: Secondary | ICD-10-CM

## 2013-02-17 DIAGNOSIS — R109 Unspecified abdominal pain: Secondary | ICD-10-CM | POA: Insufficient documentation

## 2013-02-17 LAB — URINALYSIS, ROUTINE W REFLEX MICROSCOPIC
Bilirubin Urine: NEGATIVE
Glucose, UA: NEGATIVE mg/dL
Ketones, ur: NEGATIVE mg/dL
Protein, ur: NEGATIVE mg/dL

## 2013-02-17 MED ORDER — PANTOPRAZOLE SODIUM 20 MG PO TBEC: 20.0000 mg | DELAYED_RELEASE_TABLET | Freq: Every day | ORAL | Status: DC

## 2013-02-17 MED ORDER — ONDANSETRON 8 MG PO TBDP
8.0000 mg | ORAL_TABLET | Freq: Once | ORAL | Status: AC
  Administered 2013-02-17: 8 mg via ORAL
  Filled 2013-02-17: qty 1

## 2013-02-17 MED ORDER — GI COCKTAIL ~~LOC~~
30.0000 mL | Freq: Three times a day (TID) | ORAL | Status: DC | PRN
  Administered 2013-02-17: 30 mL via ORAL
  Filled 2013-02-17: qty 30

## 2013-02-17 MED ORDER — ONDANSETRON 8 MG PO TBDP: 8.0000 mg | ORAL_TABLET | Freq: Once | ORAL | Status: DC

## 2013-02-17 MED ORDER — TRAMADOL HCL 50 MG PO TABS
100.0000 mg | ORAL_TABLET | Freq: Once | ORAL | Status: DC
  Filled 2013-02-17: qty 2

## 2013-02-17 NOTE — Telephone Encounter (Signed)
Called pt and left message on her personal voice mail that her recent lab tests are slowly becoming normal. We will repeat the test at her next visit on 03/02/13.

## 2013-02-17 NOTE — Telephone Encounter (Signed)
Message copied by Jill Side on Tue Feb 17, 2013 10:58 AM ------      Message from: CONSTANT, PEGGY      Created: Mon Feb 16, 2013  5:08 PM       Please inform patient that liver enzyme are slowly normalizing. We will repeat blood test at her next appointment            Peggy ------

## 2013-02-17 NOTE — MAU Provider Note (Signed)
  History     CSN: [id number]  Arrival date and time: 02/17/13 2027   First Provider Initiated Contact with Patient 02/17/13 2128      Chief Complaint  Patient presents with  . Abdominal Cramping   HPI Ms Pajak is a 32yo [id number] at 66.6wks with twins who presents for eval of diffuse abd pain that radiates around to back approx 1-2x/hr. Denies leak or bldg or dysuria. No N/V/D. On day 5 of Flagyl for BV.  OB History   Grav Para Term Preterm Abortions TAB SAB Ect Mult Living   5 3 3  1 1    3       Past Medical History  Diagnosis Date  . Abnormal Pap smear   . Chlamydia   . Trichomonas     January 2014    Past Surgical History  Procedure Laterality Date  . Cesarean section      Family History  Problem Relation Age of Onset  . Kidney disease Mother   . Heart disease Son   . Diabetes Maternal Aunt     History  Substance Use Topics  . Smoking status: Never Smoker   . Smokeless tobacco: Never Used  . Alcohol Use: No     Comment: social    Allergies: No Known Allergies  Prescriptions prior to admission  Medication Sig Dispense Refill  . metroNIDAZOLE (FLAGYL) 500 MG tablet Take 500 mg by mouth 2 (two) times daily.      . Prenatal Vit-Fe Fumarate-FA (PRENATAL VITAMINS PLUS) 27-1 MG TABS Take 1 tablet by mouth daily.        ROS Physical Exam   Blood pressure 128/70, pulse 117, temperature 98.9 F (37.2 C), resp. rate 20, height 5\' 8"  (1.727 m), weight 231 lb (104.781 kg), last menstrual period 10/01/2012.  Physical Exam  Constitutional: She is oriented to person, place, and time. She appears well-developed.  HENT:  Head: Normocephalic.  Neck: Normal range of motion.  Cardiovascular: Regular rhythm.   Sl tachycardia  Respiratory: Effort normal.  GI: Soft.  No ctx per toco  Genitourinary: Vagina normal.  Cx C/L  Musculoskeletal: Normal range of motion.  Neurological: She is alert and oriented to person, place, and time.  Skin: Skin is warm and dry.   Psychiatric: She has a normal mood and affect. Her behavior is normal. Thought content normal.   Urinalysis    Component Value Date/Time   COLORURINE YELLOW 02/17/2013 2045   APPEARANCEUR CLEAR 02/17/2013 2045   LABSPEC <1.005* 02/17/2013 2045   PHURINE 6.0 02/17/2013 2045   GLUCOSEU NEGATIVE 02/17/2013 2045   HGBUR NEGATIVE 02/17/2013 2045   BILIRUBINUR NEGATIVE 02/17/2013 2045   KETONESUR NEGATIVE 02/17/2013 2045   PROTEINUR NEGATIVE 02/17/2013 2045   UROBILINOGEN 0.2 02/17/2013 2045   NITRITE NEGATIVE 02/17/2013 2045   LEUKOCYTESUR LARGE* 02/17/2013 2045      MAU Course  Procedures  Offered Tramadol for pain- declined Just prior to d/c had episode of vomiting x 1 with streaks of blood; gave Zofran and GI cocktail while here  Assessment and Plan  IUP at 21.6wks Second tri abd pain Probable Mallory-Weiss tears  D/C home Rec Motrin 600mg  prn cramping until 30wks Rx Protonix 20mg  x 30d in case of ulcer  SHAW, KIMBERLY 02/17/2013, 9:31 PM

## 2013-02-17 NOTE — MAU Note (Signed)
Patient states she has been having abdominal pain that has been off and on all day.

## 2013-02-20 NOTE — MAU Provider Note (Signed)
Attestation of Attending Supervision of Advanced Practitioner (PA/CNM/NP): Evaluation and management procedures were performed by the Advanced Practitioner under my supervision and collaboration.  I have reviewed the Advanced Practitioner's note and chart, and I agree with the management and plan.  Monish Haliburton, MD, FACOG Attending Obstetrician & Gynecologist Faculty Practice, [facility]  

## 2013-03-02 ENCOUNTER — Ambulatory Visit (INDEPENDENT_AMBULATORY_CARE_PROVIDER_SITE_OTHER): Payer: Medicaid Other | Admitting: Obstetrics & Gynecology

## 2013-03-02 VITALS — BP 117/77 | Temp 97.7°F | Wt 227.0 lb

## 2013-03-02 DIAGNOSIS — O30009 Twin pregnancy, unspecified number of placenta and unspecified number of amniotic sacs, unspecified trimester: Secondary | ICD-10-CM

## 2013-03-02 DIAGNOSIS — R7989 Other specified abnormal findings of blood chemistry: Secondary | ICD-10-CM

## 2013-03-02 DIAGNOSIS — O162 Unspecified maternal hypertension, second trimester: Secondary | ICD-10-CM

## 2013-03-02 DIAGNOSIS — O139 Gestational [pregnancy-induced] hypertension without significant proteinuria, unspecified trimester: Secondary | ICD-10-CM

## 2013-03-02 LAB — COMPREHENSIVE METABOLIC PANEL
ALT: 87 U/L — ABNORMAL HIGH (ref 0–35)
AST: 40 U/L — ABNORMAL HIGH (ref 0–37)
Albumin: 3.6 g/dL (ref 3.5–5.2)
BUN: 7 mg/dL (ref 6–23)
Calcium: 8.7 mg/dL (ref 8.4–10.5)
Chloride: 107 mEq/L (ref 96–112)
Potassium: 3.9 mEq/L (ref 3.5–5.3)
Sodium: 136 mEq/L (ref 135–145)
Total Protein: 6.9 g/dL (ref 6.0–8.3)

## 2013-03-02 LAB — POCT URINALYSIS DIP (DEVICE)
Bilirubin Urine: NEGATIVE
Glucose, UA: NEGATIVE mg/dL
Nitrite: NEGATIVE

## 2013-03-02 NOTE — Progress Notes (Signed)
LFTs rechecked today; will follow up results and manage accordingly.  Growth ultrasound to be scheduled next week.  BP normal.  BP and OB precautions reviewed.

## 2013-03-02 NOTE — Progress Notes (Signed)
U/S scheduled 03/06/13 at 245 pm.

## 2013-03-02 NOTE — Progress Notes (Signed)
Pulse 80  Edema trace in feet.

## 2013-03-02 NOTE — Patient Instructions (Signed)
Return to clinic for any obstetric concerns or go to MAU for evaluation  

## 2013-03-03 ENCOUNTER — Encounter: Payer: Self-pay | Admitting: Obstetrics & Gynecology

## 2013-03-04 ENCOUNTER — Telehealth: Payer: Self-pay | Admitting: General Practice

## 2013-03-04 NOTE — Telephone Encounter (Signed)
Message copied by Kathee Delton on Wed Mar 04, 2013  8:58 AM ------      Message from: Jaynie Collins A      Created: Tue Mar 03, 2013 10:20 PM       LFTs still elevated, will need recheck next visit.  Will also check bile acids and PTT next visit. Please call to inform patient of results and recommendations.       ------

## 2013-03-04 NOTE — Telephone Encounter (Signed)
Called patient and informed her of results and next appt of 6/2 at 8am. Patient verbalized understanding and had no further questions

## 2013-03-06 ENCOUNTER — Ambulatory Visit (HOSPITAL_COMMUNITY)
Admission: RE | Admit: 2013-03-06 | Discharge: 2013-03-06 | Disposition: A | Discharge status: Home / Self Care | Payer: Medicaid Other | Source: Ambulatory Visit | Attending: Obstetrics & Gynecology | Admitting: Obstetrics & Gynecology

## 2013-03-06 DIAGNOSIS — O26612 Liver and biliary tract disorders in pregnancy, second trimester: Secondary | ICD-10-CM

## 2013-03-06 DIAGNOSIS — Z283 Underimmunization status: Secondary | ICD-10-CM

## 2013-03-06 DIAGNOSIS — O30009 Twin pregnancy, unspecified number of placenta and unspecified number of amniotic sacs, unspecified trimester: Secondary | ICD-10-CM | POA: Insufficient documentation

## 2013-03-06 DIAGNOSIS — O162 Unspecified maternal hypertension, second trimester: Secondary | ICD-10-CM

## 2013-03-06 DIAGNOSIS — Z3689 Encounter for other specified antenatal screening: Secondary | ICD-10-CM | POA: Insufficient documentation

## 2013-03-16 ENCOUNTER — Ambulatory Visit (INDEPENDENT_AMBULATORY_CARE_PROVIDER_SITE_OTHER): Payer: Medicaid Other | Admitting: Family

## 2013-03-16 VITALS — BP 124/81 | Wt 228.8 lb

## 2013-03-16 DIAGNOSIS — O139 Gestational [pregnancy-induced] hypertension without significant proteinuria, unspecified trimester: Secondary | ICD-10-CM

## 2013-03-16 DIAGNOSIS — O30009 Twin pregnancy, unspecified number of placenta and unspecified number of amniotic sacs, unspecified trimester: Secondary | ICD-10-CM

## 2013-03-16 DIAGNOSIS — O34219 Maternal care for unspecified type scar from previous cesarean delivery: Secondary | ICD-10-CM | POA: Insufficient documentation

## 2013-03-16 DIAGNOSIS — O9989 Other specified diseases and conditions complicating pregnancy, childbirth and the puerperium: Secondary | ICD-10-CM

## 2013-03-16 DIAGNOSIS — O09299 Supervision of pregnancy with other poor reproductive or obstetric history, unspecified trimester: Secondary | ICD-10-CM

## 2013-03-16 LAB — POCT URINALYSIS DIP (DEVICE)
Nitrite: NEGATIVE
Protein, ur: NEGATIVE mg/dL
Urobilinogen, UA: 0.2 mg/dL (ref 0.0–1.0)

## 2013-03-16 LAB — COMPREHENSIVE METABOLIC PANEL
ALT: 15 U/L (ref 0–35)
Alkaline Phosphatase: 100 U/L (ref 39–117)
Sodium: 136 mEq/L (ref 135–145)
Total Bilirubin: 0.2 mg/dL — ABNORMAL LOW (ref 0.3–1.2)
Total Protein: 6.7 g/dL (ref 6.0–8.3)

## 2013-03-16 NOTE — Progress Notes (Signed)
Patient signed BTL consent.

## 2013-03-16 NOTE — Progress Notes (Signed)
No questions or concerns; reviewed growth ultrasound of 5/23 A 58 B 49; 7% discrepancy; poor visualization of heart on both twins; hx of cardiac issue of 32 yr old child diagnosed at 6 wks of life > order echo; obtain LFTs, bile acids and PTT. Sign BTL forms today.

## 2013-03-16 NOTE — Progress Notes (Signed)
Pulse: 99 

## 2013-03-16 NOTE — Progress Notes (Signed)
Fetal Echo scheduled 03/26/13  at 845 a with Dr. Elizebeth Brooking. Auth # [id number]. Patient called and ML with appointment and the need to return and resign BTL papers.

## 2013-03-19 ENCOUNTER — Telehealth: Payer: Self-pay | Admitting: *Deleted

## 2013-03-19 NOTE — Telephone Encounter (Signed)
Received a message they had to reschedule Meredith Chen fetal echo appointment because of doctor schedule and wanted to make sure was ok. Called and they stated they had called patient and rescheduled it for 04/27/13- that was the first appointment they had that patient could attend. Informed her if that was the first available and patient agreed to it , that was acceptable.

## 2013-03-21 ENCOUNTER — Encounter: Payer: Self-pay | Admitting: Family

## 2013-03-30 ENCOUNTER — Encounter: Payer: Self-pay | Admitting: Obstetrics and Gynecology

## 2013-03-30 ENCOUNTER — Ambulatory Visit (INDEPENDENT_AMBULATORY_CARE_PROVIDER_SITE_OTHER): Payer: Medicaid Other | Admitting: Obstetrics and Gynecology

## 2013-03-30 VITALS — BP 131/81 | Temp 97.6°F | Wt 229.5 lb

## 2013-03-30 DIAGNOSIS — O34219 Maternal care for unspecified type scar from previous cesarean delivery: Secondary | ICD-10-CM

## 2013-03-30 DIAGNOSIS — O9989 Other specified diseases and conditions complicating pregnancy, childbirth and the puerperium: Secondary | ICD-10-CM

## 2013-03-30 DIAGNOSIS — O09299 Supervision of pregnancy with other poor reproductive or obstetric history, unspecified trimester: Secondary | ICD-10-CM

## 2013-03-30 DIAGNOSIS — O30009 Twin pregnancy, unspecified number of placenta and unspecified number of amniotic sacs, unspecified trimester: Secondary | ICD-10-CM

## 2013-03-30 DIAGNOSIS — O169 Unspecified maternal hypertension, unspecified trimester: Secondary | ICD-10-CM

## 2013-03-30 DIAGNOSIS — Z283 Underimmunization status: Secondary | ICD-10-CM

## 2013-03-30 DIAGNOSIS — O139 Gestational [pregnancy-induced] hypertension without significant proteinuria, unspecified trimester: Secondary | ICD-10-CM

## 2013-03-30 LAB — POCT URINALYSIS DIP (DEVICE)
Bilirubin Urine: NEGATIVE
Ketones, ur: NEGATIVE mg/dL
Protein, ur: NEGATIVE mg/dL
Specific Gravity, Urine: 1.02 (ref 1.005–1.030)

## 2013-03-30 LAB — CBC
Hemoglobin: 8.6 g/dL — ABNORMAL LOW (ref 12.0–15.0)
MCH: 23.8 pg — ABNORMAL LOW (ref 26.0–34.0)
MCV: 72.4 fL — ABNORMAL LOW (ref 78.0–100.0)
RBC: 3.62 MIL/uL — ABNORMAL LOW (ref 3.87–5.11)
WBC: 6.1 10*3/uL (ref 4.0–10.5)

## 2013-03-30 LAB — RPR

## 2013-03-30 NOTE — Progress Notes (Signed)
Patient doing well without complaints. FM/PTL precautions reviewed. Growth ultrasound scheduled for next week. 1 hr GCT and labs today

## 2013-03-30 NOTE — Progress Notes (Signed)
Pulse- 105  Edema-feet  Pain/pressure-vaginal, lower abd

## 2013-03-30 NOTE — Progress Notes (Signed)
U/S scheduled 04/06/13 at 730 am.

## 2013-04-01 ENCOUNTER — Telehealth: Payer: Self-pay

## 2013-04-01 ENCOUNTER — Encounter: Payer: Self-pay | Admitting: Obstetrics and Gynecology

## 2013-04-01 NOTE — Telephone Encounter (Signed)
Called pt and left message to return call to the clinics.  

## 2013-04-01 NOTE — Telephone Encounter (Signed)
Message copied by Faythe Casa on Wed Apr 01, 2013  4:37 PM ------      Message from: CONSTANT, PEGGY      Created: Wed Apr 01, 2013  4:31 PM      Regarding: needs 3 hr GTT       Please inform patient of abnormal test and need for 3 hr GTT            Peggy ------

## 2013-04-02 NOTE — Telephone Encounter (Signed)
Spoke to patient and advised of needing to do a 3 hr GTT. Patient will come in tomorrow @ 0800 for a fasting 3 hr GTT. Patient states understanding and satisfied.

## 2013-04-03 ENCOUNTER — Other Ambulatory Visit: Payer: Medicaid Other

## 2013-04-03 DIAGNOSIS — R7309 Other abnormal glucose: Secondary | ICD-10-CM

## 2013-04-03 DIAGNOSIS — O30009 Twin pregnancy, unspecified number of placenta and unspecified number of amniotic sacs, unspecified trimester: Secondary | ICD-10-CM

## 2013-04-04 LAB — GLUCOSE TOLERANCE, 3 HOURS
Glucose Tolerance, 2 hour: 101 mg/dL (ref 70–164)
Glucose Tolerance, Fasting: 69 mg/dL — ABNORMAL LOW (ref 70–104)
Glucose, GTT - 3 Hour: 99 mg/dL (ref 70–144)

## 2013-04-06 ENCOUNTER — Ambulatory Visit (HOSPITAL_COMMUNITY)
Admission: RE | Admit: 2013-04-06 | Discharge: 2013-04-06 | Disposition: A | Discharge status: Home / Self Care | Payer: Medicaid Other | Source: Ambulatory Visit | Attending: Obstetrics and Gynecology | Admitting: Obstetrics and Gynecology

## 2013-04-06 ENCOUNTER — Telehealth: Payer: Self-pay | Admitting: *Deleted

## 2013-04-06 DIAGNOSIS — O30049 Twin pregnancy, dichorionic/diamniotic, unspecified trimester: Secondary | ICD-10-CM | POA: Insufficient documentation

## 2013-04-06 DIAGNOSIS — O30009 Twin pregnancy, unspecified number of placenta and unspecified number of amniotic sacs, unspecified trimester: Secondary | ICD-10-CM

## 2013-04-06 DIAGNOSIS — O162 Unspecified maternal hypertension, second trimester: Secondary | ICD-10-CM

## 2013-04-06 DIAGNOSIS — Z283 Underimmunization status: Secondary | ICD-10-CM

## 2013-04-06 DIAGNOSIS — Z3689 Encounter for other specified antenatal screening: Secondary | ICD-10-CM | POA: Insufficient documentation

## 2013-04-06 DIAGNOSIS — O34219 Maternal care for unspecified type scar from previous cesarean delivery: Secondary | ICD-10-CM

## 2013-04-06 DIAGNOSIS — O26612 Liver and biliary tract disorders in pregnancy, second trimester: Secondary | ICD-10-CM

## 2013-04-06 DIAGNOSIS — O09299 Supervision of pregnancy with other poor reproductive or obstetric history, unspecified trimester: Secondary | ICD-10-CM

## 2013-04-06 NOTE — Telephone Encounter (Signed)
Patient left message requesting her results.

## 2013-04-06 NOTE — Telephone Encounter (Signed)
Spoke with patient, She was requesting her 3hr gtt results. Results given, patient had no further questions.

## 2013-04-13 ENCOUNTER — Ambulatory Visit (INDEPENDENT_AMBULATORY_CARE_PROVIDER_SITE_OTHER): Payer: Medicaid Other | Admitting: Family Medicine

## 2013-04-13 ENCOUNTER — Encounter: Payer: Self-pay | Admitting: Family Medicine

## 2013-04-13 VITALS — BP 122/78 | Temp 98.1°F | Wt 228.3 lb

## 2013-04-13 DIAGNOSIS — O169 Unspecified maternal hypertension, unspecified trimester: Secondary | ICD-10-CM

## 2013-04-13 DIAGNOSIS — O3421 Maternal care for scar from previous cesarean delivery: Secondary | ICD-10-CM

## 2013-04-13 DIAGNOSIS — Z23 Encounter for immunization: Secondary | ICD-10-CM

## 2013-04-13 DIAGNOSIS — R05 Cough: Secondary | ICD-10-CM

## 2013-04-13 DIAGNOSIS — O34219 Maternal care for unspecified type scar from previous cesarean delivery: Secondary | ICD-10-CM

## 2013-04-13 DIAGNOSIS — O139 Gestational [pregnancy-induced] hypertension without significant proteinuria, unspecified trimester: Secondary | ICD-10-CM

## 2013-04-13 DIAGNOSIS — O30009 Twin pregnancy, unspecified number of placenta and unspecified number of amniotic sacs, unspecified trimester: Secondary | ICD-10-CM

## 2013-04-13 DIAGNOSIS — O09299 Supervision of pregnancy with other poor reproductive or obstetric history, unspecified trimester: Secondary | ICD-10-CM

## 2013-04-13 DIAGNOSIS — R059 Cough, unspecified: Secondary | ICD-10-CM

## 2013-04-13 LAB — POCT URINALYSIS DIP (DEVICE)
Glucose, UA: NEGATIVE mg/dL
Nitrite: NEGATIVE
Protein, ur: 30 mg/dL — AB
Specific Gravity, Urine: 1.025 (ref 1.005–1.030)
Urobilinogen, UA: 1 mg/dL (ref 0.0–1.0)

## 2013-04-13 MED ORDER — TETANUS-DIPHTH-ACELL PERTUSSIS 5-2.5-18.5 LF-MCG/0.5 IM SUSP: 0.5000 mL | Freq: Once | INTRAMUSCULAR | Status: DC

## 2013-04-13 MED ORDER — AZITHROMYCIN 250 MG PO TABS: ORAL_TABLET | ORAL | Status: DC

## 2013-04-13 NOTE — Progress Notes (Signed)
U/S-on 6/23--A vtx, nml fluid 2 lb 15 oz 59%, B vtx, nml fluid 2 lb 11 oz 46% Still considering birth control options and plan for labor.

## 2013-04-13 NOTE — Patient Instructions (Addendum)
Pregnancy - Third Trimester The third trimester of pregnancy (the last 3 months) is a period of the most rapid growth for you and your baby. The baby approaches a length of 20 inches and a weight of 6 to 10 pounds. The baby is adding on fat and getting ready for life outside your body. While inside, babies have periods of sleeping and waking, sucking thumbs, and hiccuping. You can often feel small contractions of the uterus. This is false labor. It is also called Braxton-Hicks contractions. This is like a practice for labor. The usual problems in this stage of pregnancy include more difficulty breathing, swelling of the hands and feet from water retention, and having to urinate more often because of the uterus and baby pressing on your bladder.  PRENATAL EXAMS  Blood work may continue to be done during prenatal exams. These tests are done to check on your health and the probable health of your baby. Blood work is used to follow your blood levels (hemoglobin). Anemia (low hemoglobin) is common during pregnancy. Iron and vitamins are given to help prevent this. You may also continue to be checked for diabetes. Some of the past blood tests may be done again.  The size of the uterus is measured during each visit. This makes sure your baby is growing properly according to your pregnancy dates.  Your blood pressure is checked every prenatal visit. This is to make sure you are not getting toxemia.  Your urine is checked every prenatal visit for infection, diabetes, and protein.  Your weight is checked at each visit. This is done to make sure gains are happening at the suggested rate and that you and your baby are growing normally.  Sometimes, an ultrasound is performed to confirm the position and the proper growth and development of the baby. This is a test done that bounces harmless sound waves off the baby so your caregiver can more accurately determine a due date.  Discuss the type of pain medicine and  anesthesia you will have during your labor and delivery.  Discuss the possibility and anesthesia if a cesarean section might be necessary.  Inform your caregiver if there is any mental or physical violence at home. Sometimes, a specialized non-stress test, contraction stress test, and biophysical profile are done to make sure the baby is not having a problem. Checking the amniotic fluid surrounding the baby is called an amniocentesis. The amniotic fluid is removed by sticking a needle into the belly (abdomen). This is sometimes done near the end of pregnancy if an early delivery is required. In this case, it is done to help make sure the baby's lungs are mature enough for the baby to live outside of the womb. If the lungs are not mature and it is unsafe to deliver the baby, an injection of cortisone medicine is given to the mother 1 to 2 days before the delivery. This helps the baby's lungs mature and makes it safer to deliver the baby. CHANGES OCCURING IN THE THIRD TRIMESTER OF PREGNANCY Your body goes through many changes during pregnancy. They vary from person to person. Talk to your caregiver about changes you notice and are concerned about.  During the last trimester, you have probably had an increase in your appetite. It is normal to have cravings for certain foods. This varies from person to person and pregnancy to pregnancy.  You may begin to get stretch marks on your hips, abdomen, and breasts. These are normal changes in the body   during pregnancy. There are no exercises or medicines to take which prevent this change.  Constipation may be treated with a stool softener or adding bulk to your diet. Drinking lots of fluids, fiber in vegetables, fruits, and whole grains are helpful.  Exercising is also helpful. If you have been very active up until your pregnancy, most of these activities can be continued during your pregnancy. If you have been less active, it is helpful to start an exercise  program such as walking. Consult your caregiver before starting exercise programs.  Avoid all smoking, alcohol, non-prescribed drugs, herbs and "street drugs" during your pregnancy. These chemicals affect the formation and growth of the baby. Avoid chemicals throughout the pregnancy to ensure the delivery of a healthy infant.  Backache, varicose veins, and hemorrhoids may develop or get worse.  You will tire more easily in the third trimester, which is normal.  The baby's movements may be stronger and more often.  You may become short of breath easily.  Your belly button may stick out.  A yellow discharge may leak from your breasts called colostrum.  You may have a bloody mucus discharge. This usually occurs a few days to a week before labor begins. HOME CARE INSTRUCTIONS   Keep your caregiver's appointments. Follow your caregiver's instructions regarding medicine use, exercise, and diet.  During pregnancy, you are providing food for you and your baby. Continue to eat regular, well-balanced meals. Choose foods such as meat, fish, milk and other low fat dairy products, vegetables, fruits, and whole-grain breads and cereals. Your caregiver will tell you of the ideal weight gain.  A physical sexual relationship may be continued throughout pregnancy if there are no other problems such as early (premature) leaking of amniotic fluid from the membranes, vaginal bleeding, or belly (abdominal) pain.  Exercise regularly if there are no restrictions. Check with your caregiver if you are unsure of the safety of your exercises. Greater weight gain will occur in the last 2 trimesters of pregnancy. Exercising helps:  Control your weight.  Get you in shape for labor and delivery.  You lose weight after you deliver.  Rest a lot with legs elevated, or as needed for leg cramps or low back pain.  Wear a good support or jogging bra for breast tenderness during pregnancy. This may help if worn during  sleep. Pads or tissues may be used in the bra if you are leaking colostrum.  Do not use hot tubs, steam rooms, or saunas.  Wear your seat belt when driving. This protects you and your baby if you are in an accident.  Avoid raw meat, cat litter boxes and soil used by cats. These carry germs that can cause birth defects in the baby.  It is easier to leak urine during pregnancy. Tightening up and strengthening the pelvic muscles will help with this problem. You can practice stopping your urination while you are going to the bathroom. These are the same muscles you need to strengthen. It is also the muscles you would use if you were trying to stop from passing gas. You can practice tightening these muscles up 10 times a set and repeating this about 3 times per day. Once you know what muscles to tighten up, do not perform these exercises during urination. It is more likely to cause an infection by backing up the urine.  Ask for help if you have financial, counseling, or nutritional needs during pregnancy. Your caregiver will be able to offer counseling for these   needs as well as refer you for other special needs.  Make a list of emergency phone numbers and have them available.  Plan on getting help from family or friends when you go home from the hospital.  Make a trial run to the hospital.  Take prenatal classes with the father to understand, practice, and ask questions about the labor and delivery.  Prepare the baby's room or nursery.  Do not travel out of the city unless it is absolutely necessary and with the advice of your caregiver.  Wear only low or no heal shoes to have better balance and prevent falling. MEDICINES AND DRUG USE IN PREGNANCY  Take prenatal vitamins as directed. The vitamin should contain 1 milligram of folic acid. Keep all vitamins out of reach of children. Only a couple vitamins or tablets containing iron may be fatal to a baby or young child when ingested.  Avoid use  of all medicines, including herbs, over-the-counter medicines, not prescribed or suggested by your caregiver. Only take over-the-counter or prescription medicines for pain, discomfort, or fever as directed by your caregiver. Do not use aspirin, ibuprofen or naproxen unless approved by your caregiver.  Let your caregiver also know about herbs you may be using.  Alcohol is related to a number of birth defects. This includes fetal alcohol syndrome. All alcohol, in any form, should be avoided completely. Smoking will cause low birth rate and premature babies.  Illegal drugs are very harmful to the baby. They are absolutely forbidden. A baby born to an addicted mother will be addicted at birth. The baby will go through the same withdrawal an adult does. SEEK MEDICAL CARE IF: You have any concerns or worries during your pregnancy. It is better to call with your questions if you feel they cannot wait, rather than worry about them. SEEK IMMEDIATE MEDICAL CARE IF:   An unexplained oral temperature above 102 F (38.9 C) develops, or as your caregiver suggests.  You have leaking of fluid from the vagina. If leaking membranes are suspected, take your temperature and tell your caregiver of this when you call.  There is vaginal spotting, bleeding or passing clots. Tell your caregiver of the amount and how many pads are used.  You develop a bad smelling vaginal discharge with a change in the color from clear to white.  You develop vomiting that lasts more than 24 hours.  You develop chills or fever.  You develop shortness of breath.  You develop burning on urination.  You loose more than 2 pounds of weight or gain more than 2 pounds of weight or as suggested by your caregiver.  You notice sudden swelling of your face, hands, and feet or legs.  You develop belly (abdominal) pain. Round ligament discomfort is a common non-cancerous (benign) cause of abdominal pain in pregnancy. Your caregiver still  must evaluate you.  You develop a severe headache that does not go away.  You develop visual problems, blurred or double vision.  If you have not felt your baby move for more than 1 hour. If you think the baby is not moving as much as usual, eat something with sugar in it and lie down on your left side for an hour. The baby should move at least 4 to 5 times per hour. Call right away if your baby moves less than that.  You fall, are in a car accident, or any kind of trauma.  There is mental or physical violence at home. Document Released: 09/25/2001   Document Revised: 06/25/2012 Document Reviewed: 03/30/2009 [employer] Patient Information 2014 [employer]  Breastfeeding A change in hormones during your pregnancy causes growth of your breast tissue and an increase in number and size of milk ducts. The hormone prolactin allows proteins, sugars, and fats from your blood supply to make breast milk in your milk-producing glands. The hormone progesterone prevents breast milk from being released before the birth of your baby. After the birth of your baby, your progesterone level decreases allowing breast milk to be released. Thoughts of your baby, as well as his or her sucking or crying, can stimulate the release of milk from the milk-producing glands. Deciding to breastfeed (nurse) is one of the best choices you can make for you and your baby. The information that follows gives a brief review of the benefits, as well as other important skills to know about breastfeeding. BENEFITS OF BREASTFEEDING For your baby  The first milk (colostrum) helps your baby's digestive system function better.   There are antibodies in your milk that help your baby fight off infections.   Your baby has a lower incidence of asthma, allergies, and sudden infant death syndrome (SIDS).   The nutrients in breast milk are better for your baby than infant formulas.  Breast milk improves your baby's brain development.    Your baby will have less gas, colic, and constipation.  Your baby is less likely to develop other conditions, such as childhood obesity, asthma, or diabetes mellitus. For you  Breastfeeding helps develop a very special bond between you and your baby.   Breastfeeding is convenient, always available at the correct temperature, and costs nothing.   Breastfeeding helps to burn calories and helps you lose the weight gained during pregnancy.   Breastfeeding makes your uterus contract back down to normal size faster and slows bleeding following delivery.   Breastfeeding mothers have a lower risk of developing osteoporosis or breast or ovarian cancer later in life.  BREASTFEEDING FREQUENCY  A healthy, full-term baby may breastfeed as often as every hour or space his or her feedings to every 3 hours. Breastfeeding frequency will vary from baby to baby.   Newborns should be fed no less than every 2 3 hours during the day and every 4 5 hours during the night. You should breastfeed a minimum of 8 feedings in a 24 hour period.  Awaken your baby to breastfeed if it has been 3 4 hours since the last feeding.  Breastfeed when you feel the need to reduce the fullness of your breasts or when your newborn shows signs of hunger. Signs that your baby may be hungry include:  Increased alertness or activity.  Stretching.  Movement of the head from side to side.  Movement of the head and opening of the mouth when the corner of the mouth or cheek is stroked (rooting).  Increased sucking sounds, smacking lips, cooing, sighing, or squeaking.  Hand-to-mouth movements.  Increased sucking of fingers or hands.  Fussing.  Intermittent crying.  Signs of extreme hunger will require calming and consoling before you try to feed your baby. Signs of extreme hunger may include:  Restlessness.  A loud, strong cry.  Screaming.  Frequent feeding will help you make more milk and will help prevent  problems, such as sore nipples and engorgement of the breasts.  BREASTFEEDING   Whether lying down or sitting, be sure that the baby's abdomen is facing your abdomen.   Support your breast with 4 fingers under your breast   and your thumb above your nipple. Make sure your fingers are well away from your nipple and your baby's mouth.   Stroke your baby's lips gently with your finger or nipple.   When your baby's mouth is open wide enough, place all of your nipple and as much of the colored area around your nipple (areola) as possible into your baby's mouth.  More areola should be visible above his or her upper lip than below his or her lower lip.  Your baby's tongue should be between his or her lower gum and your breast.  Ensure that your baby's mouth is correctly positioned around the nipple (latched). Your baby's lips should create a seal on your breast.  Signs that your baby has effectively latched onto your nipple include:  Tugging or sucking without pain.  Swallowing heard between sucks.  Absent click or smacking sound.  Muscle movement above and in front of his or her ears with sucking.  Your baby must suck about 2 3 minutes in order to get your milk. Allow your baby to feed on each breast as long as he or she wants. Nurse your baby until he or she unlatches or falls asleep at the first breast, then offer the second breast.  Signs that your baby is full and satisfied include:  A gradual decrease in the number of sucks or complete cessation of sucking.  Falling asleep.  Extension or relaxation of his or her body.  Retention of a small amount of milk in his or her mouth.  Letting go of your breast by himself or herself.  Signs of effective breastfeeding in you include:  Breasts that have increased firmness, weight, and size prior to feeding.  Breasts that are softer after nursing.  Increased milk volume, as well as a change in milk consistency and color by the 5th  day of breastfeeding.  Breast fullness relieved by breastfeeding.  Nipples are not sore, cracked, or bleeding.  If needed, break the suction by putting your finger into the corner of your baby's mouth and sliding your finger between his or her gums. Then, remove your breast from his or her mouth.  It is common for babies to spit up a small amount after a feeding.  Babies often swallow air during feeding. This can make babies fussy. Burping your baby between breasts can help with this.  Vitamin D supplements are recommended for babies who get only breast milk.  Avoid using a pacifier during your baby's first 4 6 weeks.  Avoid supplemental feedings of water, formula, or juice in place of breastfeeding. Breast milk is all the food your baby needs. It is not necessary for your baby to have water or formula. Your breasts will make more milk if supplemental feedings are avoided during the early weeks. HOW TO TELL WHETHER YOUR BABY IS GETTING ENOUGH BREAST MILK Wondering whether or not your baby is getting enough milk is a common concern among mothers. You can be assured that your baby is getting enough milk if:   Your baby is actively sucking and you hear swallowing.   Your baby seems relaxed and satisfied after a feeding.   Your baby nurses at least 8 12 times in a 24 hour time period.  During the first 3 5 days of age:  Your baby is wetting at least 3 5 diapers in a 24 hour period. The urine should be clear and pale yellow.  Your baby is having at least 3 4 stools in   a 24 hour period. The stool should be soft and yellow.  At 5 7 days of age, your baby is having at least 3 6 stools in a 24 hour period. The stool should be seedy and yellow by 5 days of age.  Your baby has a weight loss less than 7 10% during the first 3 days of age.  Your baby does not lose weight after 3 7 days of age.  Your baby gains 4 7 ounces each week after he or she is 4 days of age.  Your baby gains weight  by 5 days of age and is back to birth weight within 2 weeks. ENGORGEMENT In the first week after your baby is born, you may experience extremely full breasts (engorgement). When engorged, your breasts may feel heavy, warm, or tender to the touch. Engorgement peaks within 24 48 hours after delivery of your baby.  Engorgement may be reduced by:  Continuing to breastfeed.  Increasing the frequency of breastfeeding.  Taking warm showers or applying warm, moist heat to your breasts just before each feeding. This increases circulation and helps the milk flow.   Gently massaging your breast before and during the feedings. With your fingertips, massage from your chest wall towards your nipple in a circular motion.   Ensuring that your baby empties at least one breast at every feeding. It also helps to start the next feeding on the opposite breast.   Expressing breast milk by hand or by using a breast pump to empty the breasts if your baby is sleepy, or not nursing well. You may also want to express milk if you are returning to work oryou feel you are getting engorged.  Ensuring your baby is latched on and positioned properly while breastfeeding. If you follow these suggestions, your engorgement should improve in 24 48 hours. If you are still experiencing difficulty, call your lactation consultant or caregiver.  CARING FOR YOURSELF Take care of your breasts.  Bathe or shower daily.   Avoid using soap on your nipples.   Wear a supportive bra. Avoid wearing underwire style bras.  Air dry your nipples for a 3 4minutes after each feeding.   Use only cotton bra pads to absorb breast milk leakage. Leaking of breast milk between feedings is normal.   Use only pure lanolin on your nipples after nursing. You do not need to wash it off before feeding your baby again. Another option is to express a few drops of breast milk and gently massage that milk into your nipples.  Continue breast  self-awareness checks. Take care of yourself.  Eat healthy foods. Alternate 3 meals with 3 snacks.  Avoid foods that you notice affect your baby in a bad way.  Drink milk, fruit juice, and water to satisfy your thirst (about 8 glasses a day).   Rest often, relax, and take your prenatal vitamins to prevent fatigue, stress, and anemia.  Avoid chewing and smoking tobacco.  Avoid alcohol and drug use.  Take over-the-counter and prescribed medicine only as directed by your caregiver or pharmacist. You should always check with your caregiver or pharmacist before taking any new medicine, vitamin, or herbal supplement.  Know that pregnancy is possible while breastfeeding. If desired, talk to your caregiver about family planning and safe birth control methods that may be used while breastfeeding. SEEK MEDICAL CARE IF:   You feel like you want to stop breastfeeding or have become frustrated with breastfeeding.  You have painful breasts or nipples.    Your nipples are cracked or bleeding.  Your breasts are red, tender, or warm.  You have a swollen area on either breast.  You have a fever or chills.  You have nausea or vomiting.  You have drainage from your nipples.  Your breasts do not become full before feedings by the 5th day after delivery.  You feel sad and depressed.  Your baby is too sleepy to eat well.  Your baby is having trouble sleeping.   Your baby is wetting less than 3 diapers in a 24 hour period.  Your baby has less than 3 stools in a 24 hour period.  Your baby's skin or the white part of his or her eyes becomes more yellow.   Your baby is not gaining weight by 87 days of age. MAKE SURE YOU:   Understand these instructions.  Will watch your condition.  Will get help right away if you are not doing well or get worse. Document Released: 10/01/2005 Document Revised: 06/25/2012 Document Reviewed: 05/07/2012 [facility] Patient Information 2014 [place],  [place]. Vaginal Birth After Cesarean Delivery Vaginal birth after Cesarean delivery (VBAC) is giving birth vaginally after previously delivering a baby by a cesarean. In the past, if a woman had a Cesarean delivery, all births afterwards would be done by Cesarean delivery. This is no longer true. It can be safe for the mother to try a vaginal delivery after having a Cesarean. The final decision to have a VBAC or repeat Cesarean delivery should be between the patient and her caregiver. The risks and benefits can be discussed relative to the reason for, and the type of the previous Cesarean delivery. WOMEN WHO PLAN TO HAVE A VBAC SHOULD CHECK WITH THEIR DOCTOR TO BE SURE THAT:  The previous Cesarean was done with a low transverse uterine incision (not a vertical classical incision).  The birth canal is big enough for the baby.  There were no other operations on the uterus.  They will have an electronic fetal monitor (EFM) on at all times during labor.  An operating room would be available and ready in case an emergency Cesarean is needed.  A doctor and surgical nursing staff would be available at all times during labor to be ready to do an emergency Cesarean if necessary.  An anesthesiologist would be present in case an emergency Cesarean is needed.  The nursery is prepared and has adequate personnel and necessary equipment available to care for the baby in case of an emergency Cesarean. BENEFITS OF VBAC:  Shorter stay in the hospital.  Lower delivery, nursery and hospital costs.  Less blood loss and need for blood transfusions.  Less fever and discomfort from major surgery.  Lower risk of blood clots.  Lower risk of infection.  Shorter recovery after going home.  Lower risk of other surgical complications, such as opening of the incision or hernia in the incision.  Decreased risk of injury to other organs.  Decreased risk for having to remove the uterus  (hysterectomy).  Decreased risk for the placenta to completely or partially cover the opening of the uterus (placenta previa) with a future pregnancy.  Ability to have a larger family if desired. RISKS OF A VBAC:  Rupture of the uterus.  Having to remove the uterus (hysterectomy) if it ruptures.  All the complications of major surgery and/or injury to other organs.  Excessive bleeding, blood clots and infection.  Lower Apgar scores (method to evaluate the newborn based on appearance, pulse, grimace, activity, and  respiration) and more risks to the baby.  There is a higher risk of uterine rupture if you induce or augment labor.  There is a higher risk of uterine rupture if you use medications to ripen the cervix. VBAC SHOULD NOT BE DONE IF:  The previous Cesarean was done with a vertical (classical) or T-shaped incision, or you do not know what kind of an incision was made.  You had a ruptured uterus.  You had surgery on your uterus.  You have medical or obstetrical problems.  There are problems with the baby.  There were two previous Cesarean deliveries and no vaginal deliveries. OTHER FACTS TO KNOW ABOUT VBAC:  It is safe to have an epidural anesthetic with VBAC.  It is safe to turn the baby from a breech position (attempt an external cephalic version).  It is safe to try a VBAC with twins.  Pregnancies later than 40 weeks have not been successful with VBAC.  There is an increased failure rate of a VBAC in obese pregnant women.  There is an increased failure rate with VABC if the baby weighs 8.8 pounds (4000 grams) or more.  There is an increased failure rate if the time between the Cesarean and VBAC is less than 19 months.  There is an increased failure rate if pre-eclampsia is present (high blood pressure, protein in the urine and swelling of face and extremities).  VBAC is very successful if there was a previous vaginal birth.  VBAC is very successful when the  labor starts spontaneously before the due date.  Delivery of VBAC is similar to having a normal spontaneous vaginal delivery. It is important to discuss VBAC with your caregiver early in the pregnancy so you can understand the risks, benefits and options. It will give you time to decide what is best in your particular case relevant to the reason for your previous Cesarean delivery. It should be understood that medical changes in the mother or pregnancy may occur during the pregnancy, which make it necessary to change you or your caregiver's initial decision. The counseling, concerns and decisions should be documented in the medical record and signed by all parties. Document Released: 03/24/2007 Document Revised: 12/24/2011 Document Reviewed: 11/12/2008 [facility] Patient Information 2014 [place], [place].

## 2013-04-13 NOTE — Progress Notes (Signed)
Pulse- 94 Edema-feet    Pressure- lower abd

## 2013-04-27 ENCOUNTER — Encounter: Payer: Self-pay | Admitting: *Deleted

## 2013-04-27 ENCOUNTER — Ambulatory Visit (INDEPENDENT_AMBULATORY_CARE_PROVIDER_SITE_OTHER): Payer: Medicaid Other | Admitting: Family Medicine

## 2013-04-27 VITALS — BP 131/75 | Temp 97.8°F | Wt 228.0 lb

## 2013-04-27 DIAGNOSIS — R82998 Other abnormal findings in urine: Secondary | ICD-10-CM

## 2013-04-27 DIAGNOSIS — O30009 Twin pregnancy, unspecified number of placenta and unspecified number of amniotic sacs, unspecified trimester: Secondary | ICD-10-CM

## 2013-04-27 DIAGNOSIS — O34219 Maternal care for unspecified type scar from previous cesarean delivery: Secondary | ICD-10-CM

## 2013-04-27 DIAGNOSIS — R8281 Pyuria: Secondary | ICD-10-CM

## 2013-04-27 LAB — POCT URINALYSIS DIP (DEVICE)
Bilirubin Urine: NEGATIVE
Glucose, UA: NEGATIVE mg/dL
Nitrite: NEGATIVE
Urobilinogen, UA: 0.2 mg/dL (ref 0.0–1.0)

## 2013-04-27 NOTE — Progress Notes (Signed)
Pulse 105 Edema trace in feet.

## 2013-04-27 NOTE — Progress Notes (Signed)
U/S for growth in 1 wk Reschedule fetal ECHO TOLAC consent C/o contractions--check cervix-internal os closed.

## 2013-04-27 NOTE — Progress Notes (Signed)
Fetal Echo appointment with Dr. Elizebeth Brooking rescheduled for 05/04/13 at 1 pm.  Ultrasound scheduled for 05/04/13 at 7:30 am.

## 2013-04-27 NOTE — Patient Instructions (Signed)
Pregnancy - Third Trimester The third trimester of pregnancy (the last 3 months) is a period of the most rapid growth for you and your baby. The baby approaches a length of 20 inches and a weight of 6 to 10 pounds. The baby is adding on fat and getting ready for life outside your body. While inside, babies have periods of sleeping and waking, sucking thumbs, and hiccuping. You can often feel small contractions of the uterus. This is false labor. It is also called Braxton-Hicks contractions. This is like a practice for labor. The usual problems in this stage of pregnancy include more difficulty breathing, swelling of the hands and feet from water retention, and having to urinate more often because of the uterus and baby pressing on your bladder.  PRENATAL EXAMS  Blood work may continue to be done during prenatal exams. These tests are done to check on your health and the probable health of your baby. Blood work is used to follow your blood levels (hemoglobin). Anemia (low hemoglobin) is common during pregnancy. Iron and vitamins are given to help prevent this. You may also continue to be checked for diabetes. Some of the past blood tests may be done again.  The size of the uterus is measured during each visit. This makes sure your baby is growing properly according to your pregnancy dates.  Your blood pressure is checked every prenatal visit. This is to make sure you are not getting toxemia.  Your urine is checked every prenatal visit for infection, diabetes, and protein.  Your weight is checked at each visit. This is done to make sure gains are happening at the suggested rate and that you and your baby are growing normally.  Sometimes, an ultrasound is performed to confirm the position and the proper growth and development of the baby. This is a test done that bounces harmless sound waves off the baby so your caregiver can more accurately determine a due date.  Discuss the type of pain medicine and  anesthesia you will have during your labor and delivery.  Discuss the possibility and anesthesia if a cesarean section might be necessary.  Inform your caregiver if there is any mental or physical violence at home. Sometimes, a specialized non-stress test, contraction stress test, and biophysical profile are done to make sure the baby is not having a problem. Checking the amniotic fluid surrounding the baby is called an amniocentesis. The amniotic fluid is removed by sticking a needle into the belly (abdomen). This is sometimes done near the end of pregnancy if an early delivery is required. In this case, it is done to help make sure the baby's lungs are mature enough for the baby to live outside of the womb. If the lungs are not mature and it is unsafe to deliver the baby, an injection of cortisone medicine is given to the mother 1 to 2 days before the delivery. This helps the baby's lungs mature and makes it safer to deliver the baby. CHANGES OCCURING IN THE THIRD TRIMESTER OF PREGNANCY Your body goes through many changes during pregnancy. They vary from person to person. Talk to your caregiver about changes you notice and are concerned about.  During the last trimester, you have probably had an increase in your appetite. It is normal to have cravings for certain foods. This varies from person to person and pregnancy to pregnancy.  You may begin to get stretch marks on your hips, abdomen, and breasts. These are normal changes in the body   during pregnancy. There are no exercises or medicines to take which prevent this change.  Constipation may be treated with a stool softener or adding bulk to your diet. Drinking lots of fluids, fiber in vegetables, fruits, and whole grains are helpful.  Exercising is also helpful. If you have been very active up until your pregnancy, most of these activities can be continued during your pregnancy. If you have been less active, it is helpful to start an exercise  program such as walking. Consult your caregiver before starting exercise programs.  Avoid all smoking, alcohol, non-prescribed drugs, herbs and "street drugs" during your pregnancy. These chemicals affect the formation and growth of the baby. Avoid chemicals throughout the pregnancy to ensure the delivery of a healthy infant.  Backache, varicose veins, and hemorrhoids may develop or get worse.  You will tire more easily in the third trimester, which is normal.  The baby's movements may be stronger and more often.  You may become short of breath easily.  Your belly button may stick out.  A yellow discharge may leak from your breasts called colostrum.  You may have a bloody mucus discharge. This usually occurs a few days to a week before labor begins. HOME CARE INSTRUCTIONS   Keep your caregiver's appointments. Follow your caregiver's instructions regarding medicine use, exercise, and diet.  During pregnancy, you are providing food for you and your baby. Continue to eat regular, well-balanced meals. Choose foods such as meat, fish, milk and other low fat dairy products, vegetables, fruits, and whole-grain breads and cereals. Your caregiver will tell you of the ideal weight gain.  A physical sexual relationship may be continued throughout pregnancy if there are no other problems such as early (premature) leaking of amniotic fluid from the membranes, vaginal bleeding, or belly (abdominal) pain.  Exercise regularly if there are no restrictions. Check with your caregiver if you are unsure of the safety of your exercises. Greater weight gain will occur in the last 2 trimesters of pregnancy. Exercising helps:  Control your weight.  Get you in shape for labor and delivery.  You lose weight after you deliver.  Rest a lot with legs elevated, or as needed for leg cramps or low back pain.  Wear a good support or jogging bra for breast tenderness during pregnancy. This may help if worn during  sleep. Pads or tissues may be used in the bra if you are leaking colostrum.  Do not use hot tubs, steam rooms, or saunas.  Wear your seat belt when driving. This protects you and your baby if you are in an accident.  Avoid raw meat, cat litter boxes and soil used by cats. These carry germs that can cause birth defects in the baby.  It is easier to leak urine during pregnancy. Tightening up and strengthening the pelvic muscles will help with this problem. You can practice stopping your urination while you are going to the bathroom. These are the same muscles you need to strengthen. It is also the muscles you would use if you were trying to stop from passing gas. You can practice tightening these muscles up 10 times a set and repeating this about 3 times per day. Once you know what muscles to tighten up, do not perform these exercises during urination. It is more likely to cause an infection by backing up the urine.  Ask for help if you have financial, counseling, or nutritional needs during pregnancy. Your caregiver will be able to offer counseling for these   needs as well as refer you for other special needs.  Make a list of emergency phone numbers and have them available.  Plan on getting help from family or friends when you go home from the hospital.  Make a trial run to the hospital.  Take prenatal classes with the father to understand, practice, and ask questions about the labor and delivery.  Prepare the baby's room or nursery.  Do not travel out of the city unless it is absolutely necessary and with the advice of your caregiver.  Wear only low or no heal shoes to have better balance and prevent falling. MEDICINES AND DRUG USE IN PREGNANCY  Take prenatal vitamins as directed. The vitamin should contain 1 milligram of folic acid. Keep all vitamins out of reach of children. Only a couple vitamins or tablets containing iron may be fatal to a baby or young child when ingested.  Avoid use  of all medicines, including herbs, over-the-counter medicines, not prescribed or suggested by your caregiver. Only take over-the-counter or prescription medicines for pain, discomfort, or fever as directed by your caregiver. Do not use aspirin, ibuprofen or naproxen unless approved by your caregiver.  Let your caregiver also know about herbs you may be using.  Alcohol is related to a number of birth defects. This includes fetal alcohol syndrome. All alcohol, in any form, should be avoided completely. Smoking will cause low birth rate and premature babies.  Illegal drugs are very harmful to the baby. They are absolutely forbidden. A baby born to an addicted mother will be addicted at birth. The baby will go through the same withdrawal an adult does. SEEK MEDICAL CARE IF: You have any concerns or worries during your pregnancy. It is better to call with your questions if you feel they cannot wait, rather than worry about them. SEEK IMMEDIATE MEDICAL CARE IF:   An unexplained oral temperature above 102 F (38.9 C) develops, or as your caregiver suggests.  You have leaking of fluid from the vagina. If leaking membranes are suspected, take your temperature and tell your caregiver of this when you call.  There is vaginal spotting, bleeding or passing clots. Tell your caregiver of the amount and how many pads are used.  You develop a bad smelling vaginal discharge with a change in the color from clear to white.  You develop vomiting that lasts more than 24 hours.  You develop chills or fever.  You develop shortness of breath.  You develop burning on urination.  You loose more than 2 pounds of weight or gain more than 2 pounds of weight or as suggested by your caregiver.  You notice sudden swelling of your face, hands, and feet or legs.  You develop belly (abdominal) pain. Round ligament discomfort is a common non-cancerous (benign) cause of abdominal pain in pregnancy. Your caregiver still  must evaluate you.  You develop a severe headache that does not go away.  You develop visual problems, blurred or double vision.  If you have not felt your baby move for more than 1 hour. If you think the baby is not moving as much as usual, eat something with sugar in it and lie down on your left side for an hour. The baby should move at least 4 to 5 times per hour. Call right away if your baby moves less than that.  You fall, are in a car accident, or any kind of trauma.  There is mental or physical violence at home. Document Released: 09/25/2001   Document Revised: 06/25/2012 Document Reviewed: 03/30/2009 [employer] Patient Information 2014 [employer].  Breastfeeding A change in hormones during your pregnancy causes growth of your breast tissue and an increase in number and size of milk ducts. The hormone prolactin allows proteins, sugars, and fats from your blood supply to make breast milk in your milk-producing glands. The hormone progesterone prevents breast milk from being released before the birth of your baby. After the birth of your baby, your progesterone level decreases allowing breast milk to be released. Thoughts of your baby, as well as his or her sucking or crying, can stimulate the release of milk from the milk-producing glands. Deciding to breastfeed (nurse) is one of the best choices you can make for you and your baby. The information that follows gives a brief review of the benefits, as well as other important skills to know about breastfeeding. BENEFITS OF BREASTFEEDING For your baby  The first milk (colostrum) helps your baby's digestive system function better.   There are antibodies in your milk that help your baby fight off infections.   Your baby has a lower incidence of asthma, allergies, and sudden infant death syndrome (SIDS).   The nutrients in breast milk are better for your baby than infant formulas.  Breast milk improves your baby's brain development.    Your baby will have less gas, colic, and constipation.  Your baby is less likely to develop other conditions, such as childhood obesity, asthma, or diabetes mellitus. For you  Breastfeeding helps develop a very special bond between you and your baby.   Breastfeeding is convenient, always available at the correct temperature, and costs nothing.   Breastfeeding helps to burn calories and helps you lose the weight gained during pregnancy.   Breastfeeding makes your uterus contract back down to normal size faster and slows bleeding following delivery.   Breastfeeding mothers have a lower risk of developing osteoporosis or breast or ovarian cancer later in life.  BREASTFEEDING FREQUENCY  A healthy, full-term baby may breastfeed as often as every hour or space his or her feedings to every 3 hours. Breastfeeding frequency will vary from baby to baby.   Newborns should be fed no less than every 2 3 hours during the day and every 4 5 hours during the night. You should breastfeed a minimum of 8 feedings in a 24 hour period.  Awaken your baby to breastfeed if it has been 3 4 hours since the last feeding.  Breastfeed when you feel the need to reduce the fullness of your breasts or when your newborn shows signs of hunger. Signs that your baby may be hungry include:  Increased alertness or activity.  Stretching.  Movement of the head from side to side.  Movement of the head and opening of the mouth when the corner of the mouth or cheek is stroked (rooting).  Increased sucking sounds, smacking lips, cooing, sighing, or squeaking.  Hand-to-mouth movements.  Increased sucking of fingers or hands.  Fussing.  Intermittent crying.  Signs of extreme hunger will require calming and consoling before you try to feed your baby. Signs of extreme hunger may include:  Restlessness.  A loud, strong cry.  Screaming.  Frequent feeding will help you make more milk and will help prevent  problems, such as sore nipples and engorgement of the breasts.  BREASTFEEDING   Whether lying down or sitting, be sure that the baby's abdomen is facing your abdomen.   Support your breast with 4 fingers under your breast   and your thumb above your nipple. Make sure your fingers are well away from your nipple and your baby's mouth.   Stroke your baby's lips gently with your finger or nipple.   When your baby's mouth is open wide enough, place all of your nipple and as much of the colored area around your nipple (areola) as possible into your baby's mouth.  More areola should be visible above his or her upper lip than below his or her lower lip.  Your baby's tongue should be between his or her lower gum and your breast.  Ensure that your baby's mouth is correctly positioned around the nipple (latched). Your baby's lips should create a seal on your breast.  Signs that your baby has effectively latched onto your nipple include:  Tugging or sucking without pain.  Swallowing heard between sucks.  Absent click or smacking sound.  Muscle movement above and in front of his or her ears with sucking.  Your baby must suck about 2 3 minutes in order to get your milk. Allow your baby to feed on each breast as long as he or she wants. Nurse your baby until he or she unlatches or falls asleep at the first breast, then offer the second breast.  Signs that your baby is full and satisfied include:  A gradual decrease in the number of sucks or complete cessation of sucking.  Falling asleep.  Extension or relaxation of his or her body.  Retention of a small amount of milk in his or her mouth.  Letting go of your breast by himself or herself.  Signs of effective breastfeeding in you include:  Breasts that have increased firmness, weight, and size prior to feeding.  Breasts that are softer after nursing.  Increased milk volume, as well as a change in milk consistency and color by the 5th  day of breastfeeding.  Breast fullness relieved by breastfeeding.  Nipples are not sore, cracked, or bleeding.  If needed, break the suction by putting your finger into the corner of your baby's mouth and sliding your finger between his or her gums. Then, remove your breast from his or her mouth.  It is common for babies to spit up a small amount after a feeding.  Babies often swallow air during feeding. This can make babies fussy. Burping your baby between breasts can help with this.  Vitamin D supplements are recommended for babies who get only breast milk.  Avoid using a pacifier during your baby's first 4 6 weeks.  Avoid supplemental feedings of water, formula, or juice in place of breastfeeding. Breast milk is all the food your baby needs. It is not necessary for your baby to have water or formula. Your breasts will make more milk if supplemental feedings are avoided during the early weeks. HOW TO TELL WHETHER YOUR BABY IS GETTING ENOUGH BREAST MILK Wondering whether or not your baby is getting enough milk is a common concern among mothers. You can be assured that your baby is getting enough milk if:   Your baby is actively sucking and you hear swallowing.   Your baby seems relaxed and satisfied after a feeding.   Your baby nurses at least 8 12 times in a 24 hour time period.  During the first 3 5 days of age:  Your baby is wetting at least 3 5 diapers in a 24 hour period. The urine should be clear and pale yellow.  Your baby is having at least 3 4 stools in   a 24 hour period. The stool should be soft and yellow.  At 5 7 days of age, your baby is having at least 3 6 stools in a 24 hour period. The stool should be seedy and yellow by 5 days of age.  Your baby has a weight loss less than 7 10% during the first 3 days of age.  Your baby does not lose weight after 3 7 days of age.  Your baby gains 4 7 ounces each week after he or she is 4 days of age.  Your baby gains weight  by 5 days of age and is back to birth weight within 2 weeks. ENGORGEMENT In the first week after your baby is born, you may experience extremely full breasts (engorgement). When engorged, your breasts may feel heavy, warm, or tender to the touch. Engorgement peaks within 24 48 hours after delivery of your baby.  Engorgement may be reduced by:  Continuing to breastfeed.  Increasing the frequency of breastfeeding.  Taking warm showers or applying warm, moist heat to your breasts just before each feeding. This increases circulation and helps the milk flow.   Gently massaging your breast before and during the feedings. With your fingertips, massage from your chest wall towards your nipple in a circular motion.   Ensuring that your baby empties at least one breast at every feeding. It also helps to start the next feeding on the opposite breast.   Expressing breast milk by hand or by using a breast pump to empty the breasts if your baby is sleepy, or not nursing well. You may also want to express milk if you are returning to work oryou feel you are getting engorged.  Ensuring your baby is latched on and positioned properly while breastfeeding. If you follow these suggestions, your engorgement should improve in 24 48 hours. If you are still experiencing difficulty, call your lactation consultant or caregiver.  CARING FOR YOURSELF Take care of your breasts.  Bathe or shower daily.   Avoid using soap on your nipples.   Wear a supportive bra. Avoid wearing underwire style bras.  Air dry your nipples for a 3 4minutes after each feeding.   Use only cotton bra pads to absorb breast milk leakage. Leaking of breast milk between feedings is normal.   Use only pure lanolin on your nipples after nursing. You do not need to wash it off before feeding your baby again. Another option is to express a few drops of breast milk and gently massage that milk into your nipples.  Continue breast  self-awareness checks. Take care of yourself.  Eat healthy foods. Alternate 3 meals with 3 snacks.  Avoid foods that you notice affect your baby in a bad way.  Drink milk, fruit juice, and water to satisfy your thirst (about 8 glasses a day).   Rest often, relax, and take your prenatal vitamins to prevent fatigue, stress, and anemia.  Avoid chewing and smoking tobacco.  Avoid alcohol and drug use.  Take over-the-counter and prescribed medicine only as directed by your caregiver or pharmacist. You should always check with your caregiver or pharmacist before taking any new medicine, vitamin, or herbal supplement.  Know that pregnancy is possible while breastfeeding. If desired, talk to your caregiver about family planning and safe birth control methods that may be used while breastfeeding. SEEK MEDICAL CARE IF:   You feel like you want to stop breastfeeding or have become frustrated with breastfeeding.  You have painful breasts or nipples.    Your nipples are cracked or bleeding.  Your breasts are red, tender, or warm.  You have a swollen area on either breast.  You have a fever or chills.  You have nausea or vomiting.  You have drainage from your nipples.  Your breasts do not become full before feedings by the 5th day after delivery.  You feel sad and depressed.  Your baby is too sleepy to eat well.  Your baby is having trouble sleeping.   Your baby is wetting less than 3 diapers in a 24 hour period.  Your baby has less than 3 stools in a 24 hour period.  Your baby's skin or the white part of his or her eyes becomes more yellow.   Your baby is not gaining weight by 5 days of age. MAKE SURE YOU:   Understand these instructions.  Will watch your condition.  Will get help right away if you are not doing well or get worse. Document Released: 10/01/2005 Document Revised: 06/25/2012 Document Reviewed: 05/07/2012 [employer] Patient Information 2014 [employer].  

## 2013-04-29 LAB — CULTURE, OB URINE: Colony Count: 60000

## 2013-05-04 ENCOUNTER — Ambulatory Visit (HOSPITAL_COMMUNITY)
Admission: RE | Admit: 2013-05-04 | Discharge: 2013-05-04 | Disposition: A | Discharge status: Home / Self Care | Payer: Medicaid Other | Source: Ambulatory Visit | Attending: Family Medicine | Admitting: Family Medicine

## 2013-05-04 ENCOUNTER — Ambulatory Visit (HOSPITAL_COMMUNITY): Payer: Medicaid Other

## 2013-05-04 DIAGNOSIS — O30049 Twin pregnancy, dichorionic/diamniotic, unspecified trimester: Secondary | ICD-10-CM | POA: Insufficient documentation

## 2013-05-04 DIAGNOSIS — Z3689 Encounter for other specified antenatal screening: Secondary | ICD-10-CM | POA: Insufficient documentation

## 2013-05-04 DIAGNOSIS — O30009 Twin pregnancy, unspecified number of placenta and unspecified number of amniotic sacs, unspecified trimester: Secondary | ICD-10-CM | POA: Insufficient documentation

## 2013-05-11 ENCOUNTER — Ambulatory Visit (INDEPENDENT_AMBULATORY_CARE_PROVIDER_SITE_OTHER): Payer: Medicaid Other | Admitting: Obstetrics and Gynecology

## 2013-05-11 ENCOUNTER — Encounter: Payer: Self-pay | Admitting: Obstetrics and Gynecology

## 2013-05-11 VITALS — BP 122/78 | Temp 98.9°F | Wt 229.2 lb

## 2013-05-11 DIAGNOSIS — O26619 Liver and biliary tract disorders in pregnancy, unspecified trimester: Secondary | ICD-10-CM

## 2013-05-11 DIAGNOSIS — O30009 Twin pregnancy, unspecified number of placenta and unspecified number of amniotic sacs, unspecified trimester: Secondary | ICD-10-CM

## 2013-05-11 DIAGNOSIS — D649 Anemia, unspecified: Secondary | ICD-10-CM

## 2013-05-11 DIAGNOSIS — Z283 Underimmunization status: Secondary | ICD-10-CM

## 2013-05-11 DIAGNOSIS — O09299 Supervision of pregnancy with other poor reproductive or obstetric history, unspecified trimester: Secondary | ICD-10-CM

## 2013-05-11 DIAGNOSIS — O9989 Other specified diseases and conditions complicating pregnancy, childbirth and the puerperium: Secondary | ICD-10-CM

## 2013-05-11 DIAGNOSIS — O26613 Liver and biliary tract disorders in pregnancy, third trimester: Secondary | ICD-10-CM

## 2013-05-11 DIAGNOSIS — O139 Gestational [pregnancy-induced] hypertension without significant proteinuria, unspecified trimester: Secondary | ICD-10-CM

## 2013-05-11 DIAGNOSIS — R05 Cough: Secondary | ICD-10-CM

## 2013-05-11 DIAGNOSIS — O34219 Maternal care for unspecified type scar from previous cesarean delivery: Secondary | ICD-10-CM

## 2013-05-11 DIAGNOSIS — O163 Unspecified maternal hypertension, third trimester: Secondary | ICD-10-CM

## 2013-05-11 LAB — POCT URINALYSIS DIP (DEVICE)
Nitrite: NEGATIVE
Protein, ur: NEGATIVE mg/dL
Urobilinogen, UA: 1 mg/dL (ref 0.0–1.0)
pH: 7 (ref 5.0–8.0)

## 2013-05-11 NOTE — Progress Notes (Signed)
Patient doing well without complaints. FM/PTL precautions reviewed. Patient desires to be induced at 36 weeks in order not to interfere with her other children's beginning of school year. Discussed that without medical indication, induction would not be scheduled, particularly without a favorable cervix. F/U fetal echo report. Patient states she had it done on 7/21 and verbal report was normal

## 2013-05-11 NOTE — Progress Notes (Signed)
Pulse- 101 Patient reports lower abdominal/pelvic pressure

## 2013-05-18 ENCOUNTER — Ambulatory Visit (INDEPENDENT_AMBULATORY_CARE_PROVIDER_SITE_OTHER): Payer: Medicaid Other | Admitting: Family Medicine

## 2013-05-18 ENCOUNTER — Encounter: Payer: Self-pay | Admitting: *Deleted

## 2013-05-18 ENCOUNTER — Encounter: Payer: Self-pay | Admitting: Family Medicine

## 2013-05-18 DIAGNOSIS — O139 Gestational [pregnancy-induced] hypertension without significant proteinuria, unspecified trimester: Secondary | ICD-10-CM

## 2013-05-18 DIAGNOSIS — O09299 Supervision of pregnancy with other poor reproductive or obstetric history, unspecified trimester: Secondary | ICD-10-CM

## 2013-05-18 DIAGNOSIS — O30009 Twin pregnancy, unspecified number of placenta and unspecified number of amniotic sacs, unspecified trimester: Secondary | ICD-10-CM

## 2013-05-18 NOTE — Progress Notes (Signed)
Pt no showed appt. No visit obtained.

## 2013-05-22 ENCOUNTER — Encounter (HOSPITAL_COMMUNITY): Payer: Self-pay

## 2013-05-22 ENCOUNTER — Inpatient Hospital Stay (HOSPITAL_COMMUNITY)
Admission: AD | Admit: 2013-05-22 | Discharge: 2013-05-22 | Disposition: A | Discharge status: Home / Self Care | Payer: Medicaid Other | Source: Ambulatory Visit | Attending: Obstetrics and Gynecology | Admitting: Obstetrics and Gynecology

## 2013-05-22 DIAGNOSIS — B379 Candidiasis, unspecified: Secondary | ICD-10-CM

## 2013-05-22 DIAGNOSIS — O2390 Unspecified genitourinary tract infection in pregnancy, unspecified trimester: Secondary | ICD-10-CM | POA: Insufficient documentation

## 2013-05-22 DIAGNOSIS — O321XX0 Maternal care for breech presentation, not applicable or unspecified: Secondary | ICD-10-CM | POA: Insufficient documentation

## 2013-05-22 DIAGNOSIS — B373 Candidiasis of vulva and vagina: Secondary | ICD-10-CM | POA: Insufficient documentation

## 2013-05-22 DIAGNOSIS — B3731 Acute candidiasis of vulva and vagina: Secondary | ICD-10-CM | POA: Insufficient documentation

## 2013-05-22 LAB — URINALYSIS, ROUTINE W REFLEX MICROSCOPIC
Bilirubin Urine: NEGATIVE
Ketones, ur: NEGATIVE mg/dL
Nitrite: NEGATIVE
Protein, ur: NEGATIVE mg/dL
Specific Gravity, Urine: <1.005 — ABNORMAL LOW (ref 1.005–1.030)
Urobilinogen, UA: 0.2 mg/dL (ref 0.0–1.0)

## 2013-05-22 LAB — URINE MICROSCOPIC-ADD ON

## 2013-05-22 MED ORDER — FLUCONAZOLE 150 MG PO TABS: 150.0000 mg | ORAL_TABLET | Freq: Once | ORAL | Status: DC

## 2013-05-22 MED ORDER — FLUCONAZOLE 150 MG PO TABS
150.0000 mg | ORAL_TABLET | Freq: Once | ORAL | Status: AC
  Administered 2013-05-22: 150 mg via ORAL
  Filled 2013-05-22: qty 1

## 2013-05-22 NOTE — MAU Note (Signed)
I was sleeping and woke up and noticed my panties were wet at 1900. No leaking since then. Some lower back pain

## 2013-05-23 NOTE — MAU Provider Note (Signed)
First Provider Initiated Contact with Patient 05/22/13 2252      Chief Complaint:  Rupture of Membranes   Meredith Chen is  32 y.o. [id number] at [redacted]w[redacted]d presents complaining of Rupture of Membranes .She woke up from a nap and felt like her panties were wet.  No leaking since.  Is pregnant with twins.  Missed appt for last u/s (last one was a month ago.  Vtx/vtx per pt) Obstetrical/Gynecological History: OB History   Grav Para Term Preterm Abortions TAB SAB Ect Mult Living   5 3 3  1 1    3      Past Medical History: Past Medical History  Diagnosis Date  . Abnormal Pap smear   . Chlamydia   . Trichomonas     January 2014    Past Surgical History: Past Surgical History  Procedure Laterality Date  . Cesarean section      Family History: Family History  Problem Relation Age of Onset  . Kidney disease Mother   . Heart disease Son   . Diabetes Maternal Aunt     Social History: History  Substance Use Topics  . Smoking status: Never Smoker   . Smokeless tobacco: Never Used  . Alcohol Use: No     Comment: social    Allergies: No Known Allergies  Meds:  No prescriptions prior to admission    Review of Systems   Constitutional: Negative for fever and chills Eyes: Negative for visual disturbances Respiratory: Negative for shortness of breath, dyspnea Cardiovascular: Negative for chest pain or palpitations  Gastrointestinal: Negative for vomiting, diarrhea and constipation Genitourinary: Negative for dysuria and urgency Musculoskeletal: Negative for back pain, joint pain, myalgias  Neurological: Negative for dizziness and headaches     Physical Exam  Blood pressure 124/68, pulse 98, temperature 98.4 F (36.9 C), resp. rate 18, height 5\' 9"  (1.753 m), weight 104.962 kg (231 lb 6.4 oz), last menstrual period 10/01/2012. GENERAL: Well-developed, well-nourished female in no acute distress.  LUNGS: Clear to auscultation bilaterally.  HEART: Regular rate and  rhythm. ABDOMEN: Soft, nontender, nondistended, gravid.  EXTREMITIES: Nontender, no edema, 2+ distal pulses. DTR's 2+ SSE:  No pooling, fern neg.  Copious d/c c/w yeast.  Wet prep yeast CERVICAL EXAM: Dilatation 2cm   Effacement 50%   Station -3   Presentation: breech  FHT:  Baseline rate 140X2 bpm   Variability moderate  Accelerations present   Decelerations none Contractions: Every 0 mins   Labs: Results for orders placed during the hospital encounter of 05/22/13 (from the past 24 hour(s))  URINALYSIS, ROUTINE W REFLEX MICROSCOPIC   Collection Time    05/22/13 10:20 PM      Result Value Range   Color, Urine YELLOW  YELLOW   APPearance CLEAR  CLEAR   Specific Gravity, Urine <1.005 (*) 1.005 - 1.030   pH 7.0  5.0 - 8.0   Glucose, UA NEGATIVE  NEGATIVE mg/dL   Hgb urine dipstick TRACE (*) NEGATIVE   Bilirubin Urine NEGATIVE  NEGATIVE   Ketones, ur NEGATIVE  NEGATIVE mg/dL   Protein, ur NEGATIVE  NEGATIVE mg/dL   Urobilinogen, UA 0.2  0.0 - 1.0 mg/dL   Nitrite NEGATIVE  NEGATIVE   Leukocytes, UA LARGE (*) NEGATIVE  URINE MICROSCOPIC-ADD ON   Collection Time    05/22/13 10:20 PM      Result Value Range   Squamous Epithelial / LPF FEW (*) RARE   WBC, UA 3-6  <3 WBC/hpf   RBC / HPF  0-2  <3 RBC/hpf   Bacteria, UA FEW (*) RARE   Urine-Other RARE YEAST       Assessment: Meredith Chen is  32 y.o. [phone] at [redacted]w[redacted]d presents with yeast infection.  Plan: Diflucan 150 mg po now; rx to pharmacy to repeat in 3 days Pt aware that first baby is breech.  Encouraged to keep next PNV  CRESENZO-DISHMAN,Lekeith Wulf 8/9/201412:27 AM

## 2013-05-24 LAB — URINE CULTURE: Colony Count: 3000

## 2013-05-24 NOTE — MAU Provider Note (Signed)
Attestation of Attending Supervision of Advanced Practitioner: Evaluation and management procedures were performed by the PA/NP/CNM/OB Fellow under my supervision/collaboration. Chart reviewed and agree with management and plan.  Tiki Tucciarone V 05/24/2013 12:08 PM

## 2013-06-01 ENCOUNTER — Ambulatory Visit (INDEPENDENT_AMBULATORY_CARE_PROVIDER_SITE_OTHER): Payer: Medicaid Other | Admitting: Obstetrics & Gynecology

## 2013-06-01 VITALS — BP 120/77 | Temp 98.7°F | Wt 230.0 lb

## 2013-06-01 DIAGNOSIS — O30009 Twin pregnancy, unspecified number of placenta and unspecified number of amniotic sacs, unspecified trimester: Secondary | ICD-10-CM

## 2013-06-01 DIAGNOSIS — O30049 Twin pregnancy, dichorionic/diamniotic, unspecified trimester: Secondary | ICD-10-CM

## 2013-06-01 LAB — POCT URINALYSIS DIP (DEVICE)
Hgb urine dipstick: NEGATIVE
Nitrite: NEGATIVE
Protein, ur: NEGATIVE mg/dL
pH: 6.5 (ref 5.0–8.0)

## 2013-06-01 NOTE — Progress Notes (Signed)
NST today. GBS and GC, CT

## 2013-06-01 NOTE — Patient Instructions (Addendum)

## 2013-06-01 NOTE — Progress Notes (Signed)
Pulse- 105 Patient reports lower abdominal pain/pressure

## 2013-06-04 ENCOUNTER — Ambulatory Visit (HOSPITAL_COMMUNITY)
Admission: RE | Admit: 2013-06-04 | Discharge: 2013-06-04 | Disposition: A | Discharge status: Home / Self Care | Payer: Medicaid Other | Source: Ambulatory Visit | Attending: Obstetrics & Gynecology | Admitting: Obstetrics & Gynecology

## 2013-06-04 DIAGNOSIS — O30009 Twin pregnancy, unspecified number of placenta and unspecified number of amniotic sacs, unspecified trimester: Secondary | ICD-10-CM | POA: Insufficient documentation

## 2013-06-04 DIAGNOSIS — Z3689 Encounter for other specified antenatal screening: Secondary | ICD-10-CM | POA: Insufficient documentation

## 2013-06-04 DIAGNOSIS — O30049 Twin pregnancy, dichorionic/diamniotic, unspecified trimester: Secondary | ICD-10-CM | POA: Insufficient documentation

## 2013-06-04 LAB — OB RESULTS CONSOLE GBS: GBS: NEGATIVE

## 2013-06-04 LAB — OB RESULTS CONSOLE GC/CHLAMYDIA: Gonorrhea: NEGATIVE

## 2013-06-08 ENCOUNTER — Ambulatory Visit (INDEPENDENT_AMBULATORY_CARE_PROVIDER_SITE_OTHER): Payer: Medicaid Other | Admitting: Obstetrics & Gynecology

## 2013-06-08 VITALS — BP 131/88 | Temp 97.9°F | Wt 232.0 lb

## 2013-06-08 DIAGNOSIS — O30009 Twin pregnancy, unspecified number of placenta and unspecified number of amniotic sacs, unspecified trimester: Secondary | ICD-10-CM

## 2013-06-08 LAB — POCT URINALYSIS DIP (DEVICE)
Glucose, UA: NEGATIVE mg/dL
Hgb urine dipstick: NEGATIVE
Ketones, ur: NEGATIVE mg/dL
Protein, ur: NEGATIVE mg/dL
Specific Gravity, Urine: 1.025 (ref 1.005–1.030)
Urobilinogen, UA: 1 mg/dL (ref 0.0–1.0)

## 2013-06-08 NOTE — Progress Notes (Signed)
Pulse- 98 

## 2013-06-08 NOTE — Patient Instructions (Signed)
Cesarean Delivery  Cesarean delivery is the birth of a baby through a cut (incision) in the abdomen and womb (uterus).  LET YOUR CAREGIVER KNOW ABOUT:  Complicationsinvolving the pregnancy.  Allergies.  Medicines taken including herbs, eyedrops, over-the-counter medicines, and creams.  Use of steroids (by mouth or creams).  Previous problems with anesthetics or numbing medicine.  Previous surgery.  History of blood clots.  History of bleeding or blood problems.  Other health problems. RISKS AND COMPLICATIONS   Bleeding.  Infection.  Blood clots.  Injury to surrounding organs.  Anesthesia problems.  Injury to the baby. BEFORE THE PROCEDURE   A tube (Foley catheter) will be placed in your bladder. The Foley catheter drains the urine from your bladder into a bag. This keeps your bladder empty during surgery.  An intravenous access tube (IV) will be placed in your arm.  Hair may be removed from your pubic area and your lower abdomen. This is to prevent infection in the incision site.  You may be given an antacid medicine to drink. This will prevent acid contents in your stomach from going into your lungs if you vomit during the surgery.  You may be given an antibiotic medicine to prevent infection. PROCEDURE   You may be given medicine to numb the lower half of your body (regional anesthetic). If you were in labor, you may have already had an epidural in place which can be used in both labor and cesarean delivery. You may possibly be given medicine to make you sleep (general anesthetic) though this is not as common.  An incision will be made in your abdomen that extends to your uterus. There are 2 basic kinds of incisions:  The horizontal (transverse) incision. Horizontal incisions are used for most routine cesarean deliveries.  The vertical (up and down) incision. This is less commonly used. This is most often reserved for women who have a serious complication  (extreme prematurity) or under emergency situations.  The horizontal and vertical incisions may both be used at the same time. However, this is very uncommon.  Your baby will then be delivered. AFTER THE PROCEDURE   If you were awake during the surgery, you will see your baby right away. If you were asleep, you will see your baby as soon as you are awake.  You may breastfeed your baby after surgery.  You may be able to get up and walk the same day as the surgery. If you need to stay in bed for a period of time, you will receive help to turn, cough, and take deep breaths after surgery. This helps prevent lung problems such as pneumonia.  Do not get out of bed alone the first time after surgery. You will need help getting out of bed until you are able to do this by yourself.  You may be able to shower the day after your cesarean delivery. After the bandage (dressing) is taken off the incision site, a nurse will assist you to shower, if you like.  You will have pneumatic compressing hose placed on your feet or lower legs. These hose are used to prevent blood clots. When you are up and walking regularly, they will no longer be necessary.  Do not cross your legs when you sit.  Save any blood clots that you pass. If you pass a clot while on the toilet, do not flush it. Call for the nurse. Tell the nurse if you think you are bleeding too much or passing too many   clots.  Start drinking liquids and eating food as directed by your caregiver. If your stomach is not ready, drinking and eating too soon can cause an increase in bloating and swelling of your intestine and abdomen. This is very uncomfortable.  You will be given medicine as needed. Let your caregivers know if you are hurting. They want you to be comfortable. You may also be given an antibiotic to prevent an infection.  Your IV will be taken out when you are drinking a reasonable amount of fluids. The Foley catheter is taken out when  you are up and walking.  If your blood type is Rh negative and your baby's blood type is Rh positive, you will be given a shot of anti-D immune globulin. This shot prevents you from having Rh problems with a future pregnancy. You should get the shot even if you had your tubes tied (tubal ligation).  If you are allowed to take the baby for a walk, place the baby in the bassinet and push it. Do not carry your baby in your arms. Document Released: 10/01/2005 Document Revised: 12/24/2011 Document Reviewed: 01/26/2011 [employer] Patient Information 2014 [employer].  

## 2013-06-08 NOTE — Progress Notes (Signed)
[place] 8/21 br/ceph. Explained indication for C/S. Schedule CS 8/27 at 38 weeks. NST reactive today

## 2013-06-09 ENCOUNTER — Encounter (HOSPITAL_COMMUNITY): Payer: Self-pay

## 2013-06-09 ENCOUNTER — Encounter (HOSPITAL_COMMUNITY)
Admission: RE | Admit: 2013-06-09 | Discharge: 2013-06-09 | Disposition: A | Discharge status: Home / Self Care | Payer: Medicaid Other | Source: Ambulatory Visit | Attending: Family Medicine | Admitting: Family Medicine

## 2013-06-09 ENCOUNTER — Encounter (HOSPITAL_COMMUNITY): Payer: Self-pay | Admitting: Pharmacy Technician

## 2013-06-09 VITALS — BP 134/80 | HR 97 | Resp 16 | Ht 69.0 in | Wt 232.0 lb

## 2013-06-09 DIAGNOSIS — O09299 Supervision of pregnancy with other poor reproductive or obstetric history, unspecified trimester: Secondary | ICD-10-CM

## 2013-06-09 DIAGNOSIS — Z283 Underimmunization status: Secondary | ICD-10-CM

## 2013-06-09 DIAGNOSIS — O30009 Twin pregnancy, unspecified number of placenta and unspecified number of amniotic sacs, unspecified trimester: Secondary | ICD-10-CM

## 2013-06-09 DIAGNOSIS — O34219 Maternal care for unspecified type scar from previous cesarean delivery: Secondary | ICD-10-CM

## 2013-06-09 DIAGNOSIS — O26612 Liver and biliary tract disorders in pregnancy, second trimester: Secondary | ICD-10-CM

## 2013-06-09 DIAGNOSIS — O163 Unspecified maternal hypertension, third trimester: Secondary | ICD-10-CM

## 2013-06-09 DIAGNOSIS — Z3490 Encounter for supervision of normal pregnancy, unspecified, unspecified trimester: Secondary | ICD-10-CM

## 2013-06-09 HISTORY — DX: Other specified health status: Z78.9

## 2013-06-09 LAB — CBC
Hemoglobin: 8.2 g/dL — ABNORMAL LOW (ref 12.0–15.0)
MCH: 22.6 pg — ABNORMAL LOW (ref 26.0–34.0)
Platelets: 239 10*3/uL (ref 150–400)
RBC: 3.63 MIL/uL — ABNORMAL LOW (ref 3.87–5.11)
WBC: 5.5 10*3/uL (ref 4.0–10.5)

## 2013-06-09 NOTE — Patient Instructions (Addendum)
Your procedure is scheduled on:06/10/13  Enter through the Main Entrance at :0945 am Pick up desk phone and dial [phone] and inform us of your arrival.  Please call [phone] if you have any problems the morning of surgery.  Remember: Do not eat food or drink liquids after midnight:tonight Water ok until:7am WED.   You may brush your teeth the morning of surgery.   DO NOT wear jewelry, eye make-up, lipstick,body lotion, or dark fingernail polish.  (Polished toes are ok) You may wear deodorant.  If you are to be admitted after surgery, leave suitcase in car until your room has been assigned. Patients discharged on the day of surgery will not be allowed to drive home. Wear loose fitting, comfortable clothes for your ride home.

## 2013-06-10 ENCOUNTER — Encounter (HOSPITAL_COMMUNITY): Payer: Self-pay | Admitting: *Deleted

## 2013-06-10 ENCOUNTER — Inpatient Hospital Stay (HOSPITAL_COMMUNITY)
Admission: RE | Admit: 2013-06-10 | Discharge: 2013-06-14 | DRG: 774 | Disposition: A | Discharge status: Home / Self Care | Payer: Medicaid Other | Source: Ambulatory Visit | Attending: Obstetrics & Gynecology | Admitting: Obstetrics & Gynecology

## 2013-06-10 ENCOUNTER — Inpatient Hospital Stay (HOSPITAL_COMMUNITY): Payer: Medicaid Other | Admitting: Anesthesiology

## 2013-06-10 ENCOUNTER — Encounter (HOSPITAL_COMMUNITY)
Admission: RE | Disposition: A | Discharge status: Home / Self Care | Payer: Self-pay | Source: Ambulatory Visit | Attending: Obstetrics & Gynecology

## 2013-06-10 ENCOUNTER — Encounter (HOSPITAL_COMMUNITY): Payer: Self-pay | Admitting: Anesthesiology

## 2013-06-10 DIAGNOSIS — O163 Unspecified maternal hypertension, third trimester: Secondary | ICD-10-CM

## 2013-06-10 DIAGNOSIS — O9903 Anemia complicating the puerperium: Secondary | ICD-10-CM | POA: Diagnosis not present

## 2013-06-10 DIAGNOSIS — O30009 Twin pregnancy, unspecified number of placenta and unspecified number of amniotic sacs, unspecified trimester: Secondary | ICD-10-CM | POA: Diagnosis present

## 2013-06-10 DIAGNOSIS — O328XX0 Maternal care for other malpresentation of fetus, not applicable or unspecified: Secondary | ICD-10-CM | POA: Diagnosis present

## 2013-06-10 DIAGNOSIS — D649 Anemia, unspecified: Secondary | ICD-10-CM | POA: Diagnosis not present

## 2013-06-10 DIAGNOSIS — O34219 Maternal care for unspecified type scar from previous cesarean delivery: Secondary | ICD-10-CM

## 2013-06-10 DIAGNOSIS — O09299 Supervision of pregnancy with other poor reproductive or obstetric history, unspecified trimester: Secondary | ICD-10-CM

## 2013-06-10 DIAGNOSIS — Z283 Underimmunization status: Secondary | ICD-10-CM

## 2013-06-10 DIAGNOSIS — O3090 Multiple gestation, unspecified, unspecified trimester: Principal | ICD-10-CM | POA: Diagnosis present

## 2013-06-10 DIAGNOSIS — O26613 Liver and biliary tract disorders in pregnancy, third trimester: Secondary | ICD-10-CM

## 2013-06-10 DIAGNOSIS — O30049 Twin pregnancy, dichorionic/diamniotic, unspecified trimester: Secondary | ICD-10-CM

## 2013-06-10 LAB — RPR: RPR Ser Ql: NONREACTIVE

## 2013-06-10 LAB — PREPARE RBC (CROSSMATCH)

## 2013-06-10 SURGERY — Surgical Case
Anesthesia: Regional | Laterality: Bilateral

## 2013-06-10 MED ORDER — FENTANYL CITRATE 0.05 MG/ML IJ SOLN
INTRAMUSCULAR | Status: AC
  Filled 2013-06-10: qty 2

## 2013-06-10 MED ORDER — IBUPROFEN 600 MG PO TABS
600.0000 mg | ORAL_TABLET | Freq: Four times a day (QID) | ORAL | Status: DC | PRN
  Filled 2013-06-10 (×4): qty 1

## 2013-06-10 MED ORDER — FENTANYL 2.5 MCG/ML BUPIVACAINE 1/10 % EPIDURAL INFUSION (WH - ANES)
14.0000 mL/h | INTRAMUSCULAR | Status: DC | PRN
  Administered 2013-06-11 – 2013-06-12 (×4): 14 mL/h via EPIDURAL
  Filled 2013-06-10 (×5): qty 125

## 2013-06-10 MED ORDER — PHENYLEPHRINE 40 MCG/ML (10ML) SYRINGE FOR IV PUSH (FOR BLOOD PRESSURE SUPPORT)
80.0000 ug | PREFILLED_SYRINGE | INTRAVENOUS | Status: DC | PRN
  Filled 2013-06-10: qty 2

## 2013-06-10 MED ORDER — OXYCODONE-ACETAMINOPHEN 5-325 MG PO TABS
1.0000 | ORAL_TABLET | ORAL | Status: DC | PRN
  Filled 2013-06-10: qty 1

## 2013-06-10 MED ORDER — EPHEDRINE 5 MG/ML INJ
10.0000 mg | INTRAVENOUS | Status: DC | PRN
  Filled 2013-06-10: qty 2
  Filled 2013-06-10 (×2): qty 4

## 2013-06-10 MED ORDER — OXYTOCIN 10 UNIT/ML IJ SOLN
INTRAMUSCULAR | Status: AC
  Filled 2013-06-10: qty 4

## 2013-06-10 MED ORDER — EPHEDRINE 5 MG/ML INJ
10.0000 mg | INTRAVENOUS | Status: DC | PRN
  Filled 2013-06-10: qty 2

## 2013-06-10 MED ORDER — LACTATED RINGERS IV SOLN: 500.0000 mL | Freq: Once | INTRAVENOUS | Status: DC

## 2013-06-10 MED ORDER — PHENYLEPHRINE 40 MCG/ML (10ML) SYRINGE FOR IV PUSH (FOR BLOOD PRESSURE SUPPORT)
80.0000 ug | PREFILLED_SYRINGE | INTRAVENOUS | Status: DC | PRN
  Filled 2013-06-10: qty 5
  Filled 2013-06-10: qty 2
  Filled 2013-06-10: qty 5

## 2013-06-10 MED ORDER — LIDOCAINE HCL (PF) 1 % IJ SOLN
30.0000 mL | INTRAMUSCULAR | Status: DC | PRN
  Filled 2013-06-10 (×2): qty 30

## 2013-06-10 MED ORDER — LIDOCAINE HCL (PF) 1 % IJ SOLN
INTRAMUSCULAR | Status: DC | PRN
  Administered 2013-06-10 (×3): 5 mL
  Administered 2013-06-11 (×2): 4 mL

## 2013-06-10 MED ORDER — OXYTOCIN BOLUS FROM INFUSION
500.0000 mL | INTRAVENOUS | Status: DC
  Administered 2013-06-12: 500 mL via INTRAVENOUS

## 2013-06-10 MED ORDER — ACETAMINOPHEN 325 MG PO TABS: 650.0000 mg | ORAL_TABLET | ORAL | Status: DC | PRN

## 2013-06-10 MED ORDER — FENTANYL CITRATE 0.05 MG/ML IJ SOLN: 100.0000 ug | INTRAMUSCULAR | Status: DC | PRN

## 2013-06-10 MED ORDER — MORPHINE SULFATE 0.5 MG/ML IJ SOLN
INTRAMUSCULAR | Status: AC
  Filled 2013-06-10: qty 10

## 2013-06-10 MED ORDER — FENTANYL 2.5 MCG/ML BUPIVACAINE 1/10 % EPIDURAL INFUSION (WH - ANES)
INTRAMUSCULAR | Status: DC | PRN
  Administered 2013-06-10: 14 mL/h via EPIDURAL

## 2013-06-10 MED ORDER — DIPHENHYDRAMINE HCL 50 MG/ML IJ SOLN: 12.5000 mg | INTRAMUSCULAR | Status: DC | PRN

## 2013-06-10 MED ORDER — LACTATED RINGERS IV SOLN: 500.0000 mL | INTRAVENOUS | Status: DC | PRN

## 2013-06-10 MED ORDER — CITRIC ACID-SODIUM CITRATE 334-500 MG/5ML PO SOLN
30.0000 mL | ORAL | Status: DC | PRN
  Administered 2013-06-11: 30 mL via ORAL
  Filled 2013-06-10: qty 15

## 2013-06-10 MED ORDER — OXYTOCIN 40 UNITS IN LACTATED RINGERS INFUSION - SIMPLE MED
62.5000 mL/h | INTRAVENOUS | Status: DC
  Administered 2013-06-12: 62.5 mL/h via INTRAVENOUS
  Filled 2013-06-10 (×2): qty 1000

## 2013-06-10 MED ORDER — TERBUTALINE SULFATE 1 MG/ML IJ SOLN: 0.2500 mg | Freq: Once | INTRAMUSCULAR | Status: AC | PRN

## 2013-06-10 MED ORDER — ONDANSETRON HCL 4 MG/2ML IJ SOLN
4.0000 mg | Freq: Four times a day (QID) | INTRAMUSCULAR | Status: DC | PRN
  Administered 2013-06-11: 4 mg via INTRAVENOUS
  Filled 2013-06-10: qty 2

## 2013-06-10 MED ORDER — OXYTOCIN 40 UNITS IN LACTATED RINGERS INFUSION - SIMPLE MED
1.0000 m[IU]/min | INTRAVENOUS | Status: DC
  Administered 2013-06-10: 16 m[IU]/min via INTRAVENOUS
  Administered 2013-06-10: 2 m[IU]/min via INTRAVENOUS
  Filled 2013-06-10: qty 1000

## 2013-06-10 MED ORDER — LACTATED RINGERS IV SOLN
INTRAVENOUS | Status: DC
  Administered 2013-06-10 – 2013-06-12 (×6): via INTRAVENOUS

## 2013-06-10 MED ORDER — ONDANSETRON HCL 4 MG/2ML IJ SOLN
INTRAMUSCULAR | Status: AC
  Filled 2013-06-10: qty 2

## 2013-06-10 SURGICAL SUPPLY — 29 items
CLAMP CORD UMBIL (MISCELLANEOUS) IMPLANT
CLOTH BEACON ORANGE TIMEOUT ST (SAFETY) ×1 IMPLANT
DRAPE LG THREE QUARTER DISP (DRAPES) ×1 IMPLANT
DRSG OPSITE POSTOP 4X10 (GAUZE/BANDAGES/DRESSINGS) ×1 IMPLANT
DURAPREP 26ML APPLICATOR (WOUND CARE) ×1 IMPLANT
ELECT REM PT RETURN 9FT ADLT (ELECTROSURGICAL)
ELECTRODE REM PT RTRN 9FT ADLT (ELECTROSURGICAL) ×1 IMPLANT
EXTRACTOR VACUUM M CUP 4 TUBE (SUCTIONS) IMPLANT
GLOVE BIOGEL PI IND STRL 7.0 (GLOVE) ×1 IMPLANT
GLOVE BIOGEL PI INDICATOR 7.0 (GLOVE)
GLOVE ECLIPSE 7.0 STRL STRAW (GLOVE) ×2 IMPLANT
GOWN PREVENTION PLUS XLARGE (GOWN DISPOSABLE) ×1 IMPLANT
GOWN STRL REIN XL XLG (GOWN DISPOSABLE) ×2 IMPLANT
KIT ABG SYR 3ML LUER SLIP (SYRINGE) IMPLANT
NDL HYPO 25X5/8 SAFETYGLIDE (NEEDLE) IMPLANT
NEEDLE HYPO 22GX1.5 SAFETY (NEEDLE) ×1 IMPLANT
NEEDLE HYPO 25X5/8 SAFETYGLIDE (NEEDLE) IMPLANT
NS IRRIG 1000ML POUR BTL (IV SOLUTION) ×1 IMPLANT
PACK C SECTION WH (CUSTOM PROCEDURE TRAY) ×1 IMPLANT
PAD OB MATERNITY 4.3X12.25 (PERSONAL CARE ITEMS) ×1 IMPLANT
RTRCTR C-SECT PINK 25CM LRG (MISCELLANEOUS) IMPLANT
STAPLER VISISTAT 35W (STAPLE) IMPLANT
SUT VIC AB 0 CTX 36 (SUTURE)
SUT VIC AB 0 CTX36XBRD ANBCTRL (SUTURE) ×3 IMPLANT
SUT VIC AB 4-0 KS 27 (SUTURE) IMPLANT
SYR 30ML LL (SYRINGE) ×1 IMPLANT
TOWEL OR 17X24 6PK STRL BLUE (TOWEL DISPOSABLE) ×1 IMPLANT
TRAY FOLEY CATH 14FR (SET/KITS/TRAYS/PACK) ×1 IMPLANT
WATER STERILE IRR 1000ML POUR (IV SOLUTION) ×1 IMPLANT

## 2013-06-10 NOTE — Anesthesia Preprocedure Evaluation (Signed)

## 2013-06-10 NOTE — H&P (Signed)
Meredith Chen is a 32 y.o. female presenting for IOL for twin gestation Di/Di Vtx/ Breach. Intiially scheduled for c-section but confirmed presentation on [place]. Pt denies LOF, no VB, no CTX, and normal fetal movement.  . Maternal Medical History:  Reason for admission: Nausea.    OB History   Grav Para Term Preterm Abortions TAB SAB Ect Mult Living   5 3 3  1 1    3      Past Medical History  Diagnosis Date  . Abnormal Pap smear   . Chlamydia   . Trichomonas     January 2014  . Medical history non-contributory    Past Surgical History  Procedure Laterality Date  . Cesarean section     Family History: family history includes Diabetes in her maternal aunt; Heart disease in her son; Kidney disease in her mother. Social History:  reports that she has never smoked. She has never used smokeless tobacco. She reports that she does not drink alcohol or use illicit drugs.   Prenatal Transfer Tool  Maternal Diabetes: No Genetic Screening: Normal Maternal Ultrasounds/Referrals: Normal Fetal Ultrasounds or other Referrals:  Fetal echo unable to visualize. Hx of cardiac murmur in son. Maternal Substance Abuse:  No Significant Maternal Medications:  None Significant Maternal Lab Results:  None GBS Neg Other Comments:  Di/Di twin gestation  Review of Systems  Constitutional: Negative for fever and chills.  Eyes: Negative for blurred vision.  Respiratory: Negative for cough.   Cardiovascular: Negative for chest pain.  Gastrointestinal: Negative for heartburn, nausea, vomiting and abdominal pain.  Genitourinary: Negative for dysuria.  Neurological: Negative for headaches.      Last menstrual period 10/01/2012. Exam Physical Exam  Constitutional: She is oriented to person, place, and time. She appears well-developed and well-nourished. No distress.  HENT:  Head: Normocephalic and atraumatic.  Eyes: Conjunctivae and EOM are normal.  Neck: Normal range of motion. Neck supple.   Cardiovascular: Normal rate, regular rhythm, normal heart sounds and intact distal pulses.  Exam reveals no gallop and no friction rub.   No murmur heard. Respiratory: Effort normal and breath sounds normal. No respiratory distress. She has no wheezes. She has no rales. She exhibits no tenderness.  GI: Soft. Bowel sounds are normal. She exhibits no distension (gravid twin gestation) and no mass. There is no tenderness. There is no rebound and no guarding.  Musculoskeletal: Normal range of motion.  Neurological: She is alert and oriented to person, place, and time.  Skin: She is not diaphoretic.  Psychiatric: She has a normal mood and affect. Her behavior is normal. Judgment and thought content normal.    SVE: 3/50/-3, VTX  Prenatal labs: ABO, Rh: --/--/O POS, O POS (08/26 1140) Antibody: NEG (08/26 1140) Rubella: 8.26 (03/19 0930) RPR: NON REACTIVE (08/26 1140)  HBsAg: NEGATIVE (04/21 1434)  HIV: NON REACTIVE (03/19 0930)  GBS: Negative (08/21 0000)   Assessment/Plan: Meredith Chen is a 32 y.o. [id number] at [redacted]w[redacted]d her for IOL for di/Di twin gestation.  #Labor: VBAC/IOL, favorable cervix with proven pelvis. Will start with pitocin. VBAC consent confirmed. #Pain: desires IV and epidural #FWB: Pending NST #ID: GBS neg #MOF: Attempt breast #MOC: BTL consent confirmed  Meredith Chen 06/10/2013, 10:57 AM

## 2013-06-10 NOTE — Progress Notes (Signed)
Meredith Chen is a 32 y.o. [phone] at [redacted]w[redacted]d admitted for induction of labor due to multiple gestation .  Subjective: Pt is comfortable in bed. Minimal discomfort with contx  Objective: BP 147/71  Pulse 85  Temp(Src) 97.3 F (36.3 C) (Axillary)  Resp 18  Ht 5\' 9"  (1.753 m)  Wt 105.235 kg (232 lb)  BMI 34.24 kg/m2  LMP 10/01/2012      FHT:  FHR: 120s x2 bpm, variability: moderate,  accelerations:  Present,  decelerations:  Absent UC:   regular, every q2-31min minutes SVE:   Dilation: 3 Effacement (%): 60 Station: -3 Exam by:: Elana Alm RNC  Labs: Lab Results  Component Value Date   WBC 5.5 06/09/2013   HGB 8.2* 06/09/2013   HCT 26.1* 06/09/2013   MCV 71.9* 06/09/2013   PLT 239 06/09/2013    Assessment / Plan: Induction of labor due to multifetal gestation,  progressing well on pitocin  Labor: Progressing on Pitocin, will continue to increase then AROM Preeclampsia:  no signs or symptoms of toxicity Fetal Wellbeing:  Category I Pain Control:  Labor support without medications I/D:  n/a Anticipated MOD:  NSVD  Mostafa Yuan RYAN 06/10/2013, 4:17 PM

## 2013-06-10 NOTE — Anesthesia Procedure Notes (Addendum)
Epidural Patient location during procedure: OB  Staffing Anesthesiologist: Phillips Grout Performed by: anesthesiologist   Preanesthetic Checklist Completed: patient identified, site marked, surgical consent, pre-op evaluation, timeout performed, IV checked, risks and benefits discussed and monitors and equipment checked  Epidural Patient position: sitting Prep: ChloraPrep Patient monitoring: heart rate, continuous pulse ox and blood pressure Approach: right paramedian Injection technique: LOR saline  Needle:  Needle type: Tuohy  Needle gauge: 17 G Needle length: 9 cm and 9 Needle insertion depth: 7 cm Catheter type: closed end flexible Catheter size: 20 Guage Catheter at skin depth: 13 cm Test dose: negative  Assessment Events: blood not aspirated, injection not painful, no injection resistance, negative IV test and no paresthesia  Additional Notes   Patient tolerated the insertion well without complications.  Epidural Patient location during procedure: OB Start time: 06/11/2013 8:31 AM  Staffing Anesthesiologist: Catie Chiao A. Performed by: anesthesiologist   Preanesthetic Checklist Completed: patient identified, site marked, surgical consent, pre-op evaluation, timeout performed, IV checked, risks and benefits discussed and monitors and equipment checked  Epidural Patient position: sitting Prep: site prepped and draped and DuraPrep Patient monitoring: continuous pulse ox and blood pressure Approach: midline Injection technique: LOR air  Needle:  Needle type: Tuohy  Needle gauge: 17 G Needle length: 9 cm and 9 Needle insertion depth: 4 cm Catheter type: closed end flexible Catheter size: 19 Gauge Catheter at skin depth: 9 cm Test dose: negative and Other  Assessment Events: blood not aspirated, injection not painful, no injection resistance, negative IV test and no paresthesia  Additional Notes Patient identified. Risks and benefits discussed  including failed block, incomplete  Pain control, post dural puncture headache, nerve damage, paralysis, blood pressure Changes, nausea, vomiting, reactions to medications-both toxic and allergic and post Partum back pain. All questions were answered. Patient expressed understanding and wished to proceed. Sterile technique was used throughout procedure. Epidural site was Dressed with sterile barrier dressing. No paresthesias, signs of intravascular injection Or signs of intrathecal spread were encountered.  Patient was more comfortable after the epidural was dosed. Please see RN's note for documentation of vital signs and FHR which are stable.

## 2013-06-11 MED ORDER — OXYTOCIN 40 UNITS IN LACTATED RINGERS INFUSION - SIMPLE MED
1.0000 m[IU]/min | INTRAVENOUS | Status: DC
  Administered 2013-06-11: 12 m[IU]/min via INTRAVENOUS
  Administered 2013-06-12: 26 m[IU]/min via INTRAVENOUS

## 2013-06-11 MED ORDER — BUPIVACAINE HCL (PF) 0.25 % IJ SOLN
INTRAMUSCULAR | Status: AC
  Filled 2013-06-11: qty 30

## 2013-06-11 NOTE — Progress Notes (Signed)
Informed Dr. Malen Gauze that Dr. Macon Large wants to deliver pt in the OR.  Also told him will keep him updated if anything changes.

## 2013-06-11 NOTE — Progress Notes (Signed)
Patient ID: Meredith Chen, female   DOB: [birth year], 32 y.o.   MRN: [id number] Meredith Chen is a 32 y.o. [id number] at [redacted]w[redacted]d admitted for IOL for vertex/breech twins with concordant growth.   Subjective:  Comfortable with epidural but is feeling pressure.  Objective: BP 129/79  Pulse 78  Temp(Src) 98.4 F (36.9 C) (Axillary)  Resp 18  Ht 5\' 9"  (1.753 m)  Wt 105.235 kg (232 lb)  BMI 34.24 kg/m2  SpO2 98%  LMP 10/01/2012 I/O last 3 completed shifts: In: -  Out: 3600 [Urine:3600] Total I/O In: -  Out: 600 [Urine:600]  FHT:  Baby A- baseline 120, mod var, +accels, no decels. Baby B- baseline 120, mod var, +accels, one variable decel UC:   MVU currently about 50 SVE:   Dilation: 6 Effacement (%): 90 Station: -1 Exam by:: fran CNM  Labs: Lab Results  Component Value Date   WBC 5.5 06/09/2013   HGB 8.2* 06/09/2013   HCT 26.1* 06/09/2013   MCV 71.9* 06/09/2013   PLT 239 06/09/2013    Assessment / Plan: IOL for di/di twins with concordant growth  Labor: progressing slowly on pitocin. cont to titrate. IUPC in place. Pit currently on 16 milliunits/min. Fetal Wellbeing:  Category I and category I Pain Control:  Epidural I/D:  n/a Anticipated MOD:  NSVD  Levert Feinstein 06/11/2013, 9:32 PM

## 2013-06-11 NOTE — H&P (Signed)
Attestation of Attending Supervision of Advanced Practitioner: Evaluation and management procedures were performed by the PA/NP/CNM/OB Fellow under my supervision/collaboration. Chart reviewed and agree with management and plan.  Kamal Jurgens V 06/11/2013 12:05 PM

## 2013-06-11 NOTE — Progress Notes (Signed)
Meredith Chen is a 32 y.o. [phone] at [redacted]w[redacted]d   Subjective: Comfortable with epidural  Objective: BP 108/58  Pulse 88  Temp(Src) 98 F (36.7 C) (Oral)  Resp 18  Ht 5\' 9"  (1.753 m)  Wt 105.235 kg (232 lb)  BMI 34.24 kg/m2  SpO2 99%  LMP 10/01/2012      FHT:  FHR: 130 x 2 bpm, variability: moderate,  accelerations:  Present,  decelerations:  Absent UC:   regular, every 2-3 minutes with Pitocin SVE:   Dilation: 4 Effacement (%): 70 Station: -2 Exam by:: K.Lorma Heater,CNM AROM for clear fluid  Labs: Lab Results  Component Value Date   WBC 5.5 06/09/2013   HGB 8.2* 06/09/2013   HCT 26.1* 06/09/2013   MCV 71.9* 06/09/2013   PLT 239 06/09/2013    Assessment / Plan: IOL process Latent phase  Hopeful that AROM will increase labor; continue with Pitocin  Meredith Chen 06/11/2013, 5:21 AM

## 2013-06-11 NOTE — Progress Notes (Signed)
Pt received a PCA bolus, within minutes pt complains of headache. Anesthesia called, Dr. Acey Lav, whom responded with a laugh and said it was normal for her to feel this way. Reassured pt that we would watch her vital signs and continue monitoring.

## 2013-06-11 NOTE — Progress Notes (Signed)
Received report from Emilio Math, RN, who explained symptoms pt had during Epidural placement & with PCA dose x one.  Called Dr. Malen Gauze & asked him to come see pt; explained above & that pt currently denies headache or tongue numbness but right leg is much number than left.  Pt also denies contraction pain in abdomen or back.  He suggested replacing Epidural and reason why; this RN relayed this to pt since Dr. Malen Gauze is unable to personally speak to pt at this time.  Pt states she would like Epidural to be replaced.  Dr. Malen Gauze stated he will be in to replace Epidural within the hour.  Pt states this is fine.

## 2013-06-11 NOTE — Progress Notes (Signed)
Patient ID: Meredith Chen, female   DOB: [birth year], 32 y.o.   MRN: [id number] Meredith Chen is a 32 y.o. [id number] at [redacted]w[redacted]d admitted for IOL for vertex/breech twins with concordant growth.   Subjective:  Pt is comfortable overall.  Feeling some pressure. +FM  Objective: BP 124/74  Pulse 77  Temp(Src) 98.1 F (36.7 C) (Oral)  Resp 20  Ht 5\' 9"  (1.753 m)  Wt 105.235 kg (232 lb)  BMI 34.24 kg/m2  SpO2 98%  LMP 10/01/2012      FHT:  Baby A- baseline 115, mod var, +accels, no decels. Baby B- baseline 125s, mod var, +accels, 2 variable decels UC:   regular, every 2-4 minutes SVE:   Dilation: 6 Effacement (%): 80 Station: -2 Exam by:: Dr. Reola Calkins  Labs: Lab Results  Component Value Date   WBC 5.5 06/09/2013   HGB 8.2* 06/09/2013   HCT 26.1* 06/09/2013   MCV 71.9* 06/09/2013   PLT 239 06/09/2013    Assessment / Plan: IOL for di/di twins with concordant growth  Labor: progressing slowly on pitocin. cont to titrate. IUPC placed given she is on 50mu/min of pitocin. No difficulty. Pt tolerated it well.  Fetal Wellbeing:  Category I and category I Pain Control:  Epidural I/D:  n/a Anticipated MOD:  NSVD  Jaree Dwight L 06/11/2013, 2:55 PM

## 2013-06-11 NOTE — Progress Notes (Signed)
Meredith Chen is a 32 y.o. [id number] at [redacted]w[redacted]d admitted for IOL for vertex/breech twins with concordant growth.   Subjective:  Pt is starting to feel uncomfortable over the last 10 minutes. Feeling some pressure. +FM  Objective: BP 129/82  Pulse 82  Temp(Src) 97.7 F (36.5 C) (Oral)  Resp 20  Ht 5\' 9"  (1.753 m)  Wt 105.235 kg (232 lb)  BMI 34.24 kg/m2  SpO2 98%  LMP 10/01/2012      FHT:  Baby A- baseline 120s, mod var, +accels, no decels. Baby B- baseline 130s, mod var, +accels, no decels.  UC:   regular, every 2-4 minutes SVE:   Dilation: 5.5 Effacement (%): 70 Station: -2 Exam by:: Reola Calkins, MD  Labs: Lab Results  Component Value Date   WBC 5.5 06/09/2013   HGB 8.2* 06/09/2013   HCT 26.1* 06/09/2013   MCV 71.9* 06/09/2013   PLT 239 06/09/2013    Assessment / Plan: IOL for di/di twins with concordant growth  Labor: progressing slowly on pitocin. cont to titrate Fetal Wellbeing:  Category I and category I Pain Control:  Epidural I/D:  n/a Anticipated MOD:  NSVD  Murdock Jellison L 06/11/2013, 11:39 AM

## 2013-06-12 ENCOUNTER — Encounter (HOSPITAL_COMMUNITY): Payer: Self-pay | Admitting: *Deleted

## 2013-06-12 DIAGNOSIS — O3090 Multiple gestation, unspecified, unspecified trimester: Secondary | ICD-10-CM

## 2013-06-12 DIAGNOSIS — O329XX0 Maternal care for malpresentation of fetus, unspecified, not applicable or unspecified: Secondary | ICD-10-CM

## 2013-06-12 LAB — DIC (DISSEMINATED INTRAVASCULAR COAGULATION)PANEL
Fibrinogen: 422 mg/dL (ref 204–475)
INR: 1.16 (ref 0.00–1.49)
Prothrombin Time: 14.6 seconds (ref 11.6–15.2)
aPTT: 30 seconds (ref 24–37)

## 2013-06-12 LAB — CBC
Hemoglobin: 6.2 g/dL — CL (ref 12.0–15.0)
MCHC: 31 g/dL (ref 30.0–36.0)
Platelets: 202 10*3/uL (ref 150–400)
RBC: 2.77 MIL/uL — ABNORMAL LOW (ref 3.87–5.11)

## 2013-06-12 MED ORDER — ONDANSETRON HCL 4 MG PO TABS: 4.0000 mg | ORAL_TABLET | ORAL | Status: DC | PRN

## 2013-06-12 MED ORDER — IBUPROFEN 600 MG PO TABS
600.0000 mg | ORAL_TABLET | Freq: Four times a day (QID) | ORAL | Status: DC
  Administered 2013-06-12 – 2013-06-14 (×8): 600 mg via ORAL
  Filled 2013-06-12 (×6): qty 1

## 2013-06-12 MED ORDER — SCOPOLAMINE 1 MG/3DAYS TD PT72
1.0000 | MEDICATED_PATCH | Freq: Once | TRANSDERMAL | Status: DC
  Filled 2013-06-12: qty 1

## 2013-06-12 MED ORDER — LACTATED RINGERS IV SOLN: INTRAVENOUS | Status: DC

## 2013-06-12 MED ORDER — MISOPROSTOL 200 MCG PO TABS
ORAL_TABLET | ORAL | Status: AC
  Filled 2013-06-12: qty 4

## 2013-06-12 MED ORDER — WITCH HAZEL-GLYCERIN EX PADS
1.0000 "application " | MEDICATED_PAD | CUTANEOUS | Status: DC | PRN
  Administered 2013-06-12: 1 via TOPICAL

## 2013-06-12 MED ORDER — SIMETHICONE 80 MG PO CHEW: 80.0000 mg | CHEWABLE_TABLET | ORAL | Status: DC | PRN

## 2013-06-12 MED ORDER — ZOLPIDEM TARTRATE 5 MG PO TABS: 5.0000 mg | ORAL_TABLET | Freq: Every evening | ORAL | Status: DC | PRN

## 2013-06-12 MED ORDER — ONDANSETRON HCL 4 MG/2ML IJ SOLN: 4.0000 mg | INTRAMUSCULAR | Status: DC | PRN

## 2013-06-12 MED ORDER — DIPHENHYDRAMINE HCL 25 MG PO CAPS: 25.0000 mg | ORAL_CAPSULE | Freq: Four times a day (QID) | ORAL | Status: DC | PRN

## 2013-06-12 MED ORDER — LACTATED RINGERS IV SOLN: Freq: Once | INTRAVENOUS | Status: DC

## 2013-06-12 MED ORDER — TETANUS-DIPHTH-ACELL PERTUSSIS 5-2.5-18.5 LF-MCG/0.5 IM SUSP: 0.5000 mL | Freq: Once | INTRAMUSCULAR | Status: DC

## 2013-06-12 MED ORDER — LANOLIN HYDROUS EX OINT: TOPICAL_OINTMENT | CUTANEOUS | Status: DC | PRN

## 2013-06-12 MED ORDER — DIBUCAINE 1 % RE OINT
1.0000 "application " | TOPICAL_OINTMENT | RECTAL | Status: DC | PRN
  Administered 2013-06-12: 1 via RECTAL
  Filled 2013-06-12 (×2): qty 28

## 2013-06-12 MED ORDER — BENZOCAINE-MENTHOL 20-0.5 % EX AERO
1.0000 "application " | INHALATION_SPRAY | CUTANEOUS | Status: DC | PRN
  Administered 2013-06-12: 1 via TOPICAL
  Filled 2013-06-12 (×2): qty 56

## 2013-06-12 MED ORDER — SENNOSIDES-DOCUSATE SODIUM 8.6-50 MG PO TABS
2.0000 | ORAL_TABLET | Freq: Every day | ORAL | Status: DC
  Administered 2013-06-12 – 2013-06-13 (×2): 2 via ORAL

## 2013-06-12 MED ORDER — OXYCODONE-ACETAMINOPHEN 5-325 MG PO TABS
1.0000 | ORAL_TABLET | ORAL | Status: DC | PRN
  Administered 2013-06-12 (×2): 1 via ORAL
  Administered 2013-06-12: 2 via ORAL
  Filled 2013-06-12: qty 2
  Filled 2013-06-12: qty 1

## 2013-06-12 MED ORDER — PRENATAL MULTIVITAMIN CH
1.0000 | ORAL_TABLET | Freq: Every day | ORAL | Status: DC
  Administered 2013-06-12 – 2013-06-14 (×2): 1 via ORAL
  Filled 2013-06-12 (×3): qty 1

## 2013-06-12 NOTE — Anesthesia Postprocedure Evaluation (Signed)
  Anesthesia Post-op Note  Patient: Meredith Chen  Procedure(s) Performed: * No procedures listed *  Patient Location: PACU and Mother/Baby  Anesthesia Type:Epidural  Level of Consciousness: awake, alert  and oriented  Airway and Oxygen Therapy: Patient Spontanous Breathing  Post-op Pain: none  Post-op Assessment: Post-op Vital signs reviewed, Patient's Cardiovascular Status Stable, No headache, No backache, No residual numbness and No residual motor weakness  Post-op Vital Signs: Reviewed and stable  Complications: No apparent anesthesia complications

## 2013-06-12 NOTE — Progress Notes (Signed)
ASHLLEY BOOHER is a 32 y.o. [id number] at [redacted]w[redacted]d admitted for IOL for twins with concordant growth.   Subjective: Comfortable with epidural, denies feeling pressure.  Objective: BP 115/82  Pulse 82  Temp(Src) 98.4 F (36.9 C) (Axillary)  Resp 18  Ht 5\' 9"  (1.753 m)  Wt 232 lb (105.235 kg)  BMI 34.24 kg/m2  SpO2 98%  LMP 10/01/2012 I/O last 3 completed shifts: In: -  Out: 3600 [Urine:3600] Total I/O In: -  Out: 2600 [Urine:2600]  FHT:  Baby A- baseline 120, mod var, +accels, no decels. Baby B- baseline 120, mod var, +accels, no variable decel UC:  q 3-4 minutes Pitocin at 20 mu/min  Dilation: 9 Effacement (%): 100 Cervical Position: Middle Station: 0 Presentation: Vertex Exam by:: GPayne, RN  Ultrasound showed twins in vertex, transverse position.  Labs: Lab Results  Component Value Date   WBC 5.5 06/09/2013   HGB 8.2* 06/09/2013   HCT 26.1* 06/09/2013   MCV 71.9* 06/09/2013   PLT 239 06/09/2013    Assessment / Plan: IOL for di/di twins with concordant growth at [redacted]w[redacted]d  Labor: Will reevaluate soon and start pushing if fully dilated Fetal Wellbeing:  Category I x 2 Pain Control:  Epidural Anticipated MOD:  NSVD  Rola Lennon A, M.D. 06/12/2013, 2:06 AM

## 2013-06-12 NOTE — Plan of Care (Signed)
Problem: Phase II Progression Outcomes Goal: Skin to skin contact initiated Outcome: Not Met (add Reason) Patient refused

## 2013-06-12 NOTE — Consult Note (Signed)
Neonatology Note:  Attendance at Delivery:  I was asked by Dr. Dove to attend this VBAC of [name] at 38 weeks, due to prolonged fetal bradycardia and compound presentation. The mother is a G5P3A1 O pos, GBS neg with Di-Di twin gestation. ROM of Twin A was 27 hours before delivery, fluid clear. At delivery, infant was flaccid, pale, blue, and apneic at birth, but with HR about 100. We bulb suctioned some very thick mucous from the pharynx, then gave vigorous stimulation, without response, so PPV was immediately applied. We did PPV for 1.5 min, with good HR, improvement in color, and gradual initiation of breathing. The baby was breathing on her own at 2.5 min and cried at 3 minutes. Her circulation and pallor resolved such that, at 5 minutes, her color was pink with good perfusion in room air. We did chest PT for about 1-2 min for some audible rales, which improved. Ap 2/9. No resp distress after 5 minutes. Cord clamp was noted to be on skin, so it was replaced and the old clamp removed. I spoke with the mother in the DR and made her aware that there might be bruising at that location. To CN to care of Pediatrician.  Meredith Whirley C. Bela Nyborg, MD  

## 2013-06-12 NOTE — Progress Notes (Signed)
Patient ID: Meredith Chen, female   DOB: 1981/02/07, 32 y.o.   MRN: [id number] Meredith Chen is a 32 y.o. [id number] at [redacted]w[redacted]d admitted for IOL for vertex/breech twins with concordant growth.   Subjective:  Comfortable with epidural but is feeling pressure.  Objective: BP 125/78  Pulse 72  Temp(Src) 98.4 F (36.9 C) (Axillary)  Resp 18  Ht 5\' 9"  (1.753 m)  Wt 105.235 kg (232 lb)  BMI 34.24 kg/m2  SpO2 98%  LMP 10/01/2012 I/O last 3 completed shifts: In: -  Out: 3600 [Urine:3600] Total I/O In: -  Out: 1600 [Urine:1600]  FHT:  Baby A- baseline 120, mod var, +accels, no decels. Baby B- baseline 120, mod var, +accels, one variable decel UC:   IUPC nearly out, so replaced SVE:   7/90/-1 Pitocin at 20 mu/min   Labs: Lab Results  Component Value Date   WBC 5.5 06/09/2013   HGB 8.2* 06/09/2013   HCT 26.1* 06/09/2013   MCV 71.9* 06/09/2013   PLT 239 06/09/2013    Assessment / Plan: IOL for di/di twins with concordant growth  Labor: progressing slowly on pitocin. cont to titrate. Wellbeing:  Category I and category I Pain Control:  Epidural I/D:  n/a Anticipated MOD:  NSVD  CRESENZO-DISHMAN,Inge Waldroup 06/12/2013, 12:04 AM

## 2013-06-13 LAB — TYPE AND SCREEN
Antibody Screen: NEGATIVE
Unit division: 0
Unit division: 0
Unit division: 0

## 2013-06-13 LAB — CBC
HCT: 19 % — ABNORMAL LOW (ref 36.0–46.0)
Hemoglobin: 6.1 g/dL — CL (ref 12.0–15.0)
Hemoglobin: 8.7 g/dL — ABNORMAL LOW (ref 12.0–15.0)
MCHC: 33 g/dL (ref 30.0–36.0)
RBC: 2.59 MIL/uL — ABNORMAL LOW (ref 3.87–5.11)
RDW: 18.7 % — ABNORMAL HIGH (ref 11.5–15.5)
WBC: 9.8 10*3/uL (ref 4.0–10.5)

## 2013-06-13 LAB — PREPARE RBC (CROSSMATCH)

## 2013-06-13 MED ORDER — METOCLOPRAMIDE HCL 10 MG PO TABS: 10.0000 mg | ORAL_TABLET | Freq: Once | ORAL | Status: DC

## 2013-06-13 MED ORDER — FAMOTIDINE 20 MG PO TABS: 40.0000 mg | ORAL_TABLET | Freq: Once | ORAL | Status: DC

## 2013-06-13 MED ORDER — LACTATED RINGERS IV SOLN: INTRAVENOUS | Status: DC

## 2013-06-13 NOTE — Progress Notes (Addendum)
Patient ID: Meredith Chen, female   DOB: 1981-07-11, 32 y.o.   MRN: [id number] Patient with symptomatic anemia and plan for transfusion of 3 units pRBC today. Will postpone tubal ligation until tomorrow morning. Patient informed and agree with discussed plan.

## 2013-06-13 NOTE — Progress Notes (Signed)
Post Partum Day 1 Subjective: pt doing well. gets lightheaded when stands up though. has epidural still in place for BTL later today. pain controlled, tolerable bleeding  Objective: Blood pressure 136/75, pulse 85, temperature 98.4 F (36.9 C), temperature source Oral, resp. rate 18, height 5\' 9"  (1.753 m), weight 105.235 kg (232 lb), last menstrual period 10/01/2012, SpO2 98.00%, unknown if currently breastfeeding.  Physical Exam:  General: alert, cooperative and no distress Lochia: appropriate Uterine Fundus: firm Incision: na DVT Evaluation: No evidence of DVT seen on physical exam.   Recent Labs  06/12/13 0942 06/13/13 0555  HGB 6.2* 6.1*  HCT 20.0* 19.0*    Assessment/Plan: - pp tubal today - will transfuse two units of PRBCs given Hgb of 6.1 and in the setting of a trip to the OR and symptomatic anemia - breast and bottle feeding   LOS: 3 days   Breyson Kelm L 06/13/2013, 7:33 AM

## 2013-06-13 NOTE — Progress Notes (Signed)
Informed patient of plan for BTL at 0900. Patient states "I think I'm going to back out of that" and schedule outpatient. [facility] CNM notified who states she will notify Dr. Jolayne Panther.

## 2013-06-14 ENCOUNTER — Encounter (HOSPITAL_COMMUNITY)
Admission: RE | Disposition: A | Discharge status: Home / Self Care | Payer: Self-pay | Source: Ambulatory Visit | Attending: Obstetrics & Gynecology

## 2013-06-14 LAB — TYPE AND SCREEN
ABO/RH(D): O POS
Unit division: 0

## 2013-06-14 SURGERY — LIGATION, FALLOPIAN TUBE, POSTPARTUM
Anesthesia: Epidural | Laterality: Bilateral

## 2013-06-14 MED ORDER — INTEGRA F 125-1 MG PO CAPS: 1.0000 | ORAL_CAPSULE | Freq: Every day | ORAL | Status: DC

## 2013-06-14 MED ORDER — DOCUSATE SODIUM 100 MG PO CAPS: 100.0000 mg | ORAL_CAPSULE | Freq: Two times a day (BID) | ORAL | Status: DC | PRN

## 2013-06-14 MED ORDER — IBUPROFEN 600 MG PO TABS: 600.0000 mg | ORAL_TABLET | Freq: Four times a day (QID) | ORAL | Status: DC | PRN

## 2013-06-14 MED ORDER — ACETAMINOPHEN-CODEINE 300-30 MG PO TABS: 1.0000 | ORAL_TABLET | ORAL | Status: DC | PRN

## 2013-06-14 SURGICAL SUPPLY — 19 items
CHLORAPREP W/TINT 26ML (MISCELLANEOUS) ×1 IMPLANT
CONTAINER PREFILL 10% NBF 15ML (MISCELLANEOUS) ×2 IMPLANT
GLOVE BIOGEL PI IND STRL 6.5 (GLOVE) ×1 IMPLANT
GLOVE BIOGEL PI INDICATOR 6.5 (GLOVE)
GLOVE SURG SS PI 6.0 STRL IVOR (GLOVE) ×1 IMPLANT
GOWN PREVENTION PLUS LG XLONG (DISPOSABLE) ×2 IMPLANT
NDL HYPO 25X1 1.5 SAFETY (NEEDLE) IMPLANT
NEEDLE HYPO 25X1 1.5 SAFETY (NEEDLE) IMPLANT
NS IRRIG 1000ML POUR BTL (IV SOLUTION) ×1 IMPLANT
PACK ABDOMINAL MINOR (CUSTOM PROCEDURE TRAY) ×1 IMPLANT
SPONGE LAP 4X18 X RAY DECT (DISPOSABLE) IMPLANT
SUT PLAIN 0 NONE (SUTURE) ×1 IMPLANT
SUT VIC AB 0 CT1 27 (SUTURE)
SUT VIC AB 0 CT1 27XBRD ANBCTR (SUTURE) ×1 IMPLANT
SUT VIC AB 3-0 PS2 18 (SUTURE) ×1 IMPLANT
SYR CONTROL 10ML LL (SYRINGE) IMPLANT
TOWEL OR 17X24 6PK STRL BLUE (TOWEL DISPOSABLE) ×2 IMPLANT
TRAY FOLEY CATH 14FR (SET/KITS/TRAYS/PACK) ×1 IMPLANT
WATER STERILE IRR 1000ML POUR (IV SOLUTION) ×1 IMPLANT

## 2013-06-14 NOTE — Discharge Summary (Signed)
Obstetric Discharge Summary Reason for Admission: induction of labor Prenatal Procedures: NST and ultrasound Intrapartum Procedures: spontaneous vaginal delivery and vacuum; VAVD for [name] Postpartum Procedures: transfusion 5u PRBC Complications-Operative and Postpartum: 3rd degree perineal laceration and hemorrhage Hemoglobin  Date Value Range Status  06/13/2013 8.7* 12.0 - 15.0 g/dL Final     REPEATED TO VERIFY     DELTA CHECK NOTED     POST TRANSFUSION SPECIMEN     HCT  Date Value Range Status  06/13/2013 26.4* 36.0 - 46.0 % Final   S:  Pt denies any dizziness, shortness of breath, or chest pain.  Desires discharge home.  Bottlefeeding.  Considering implanon for family planning.   Physical Exam:  General: alert, cooperative and appears stated age Lochia: appropriate Uterine Fundus: firm Incision: n/a DVT Evaluation: No evidence of DVT seen on physical exam. Negative Homan's sign.  Discharge Diagnoses: Term Pregnancy Twin Delivery; Postpartum Hemorrhage  Discharge Information: Date: 06/14/2013 Activity: pelvic rest Diet: routine Medications: Tylenol #3, Ibuprofen and Iron Condition: stable Instructions: refer to practice specific booklet Discharge to: home Follow-up Information   [facility] with [facility] In 4 weeks. (Keep scheduled appointment)    Contact information:   [address] [place] [address]       Newborn Data:   Japneet, Staggs [[id number]]  Live born unspecified sex  Birth Weight:  APGAR: ,    Dinkel, Girl Viola [[id number]]  Live born female  Birth Weight: 5 lb 9.4 oz (2534 g) APGAR: 9, 9   Luci, Bellucci [[id number]]  Live born female  Birth Weight: 6 lb 1.5 oz (2764 g) APGAR: 2, 9  Home with mother.  [facility] 06/14/2013, 7:40 AM

## 2013-06-14 NOTE — Progress Notes (Signed)
Per Dr. Pollie Meyer, epidural catheter may be removed since patient has decided against BTL. L&D RN to come and remove epidural catheter.

## 2013-06-15 NOTE — Discharge Summary (Signed)
Attestation of Attending Supervision of Advanced Practitioner (CNM/NP): Evaluation and management procedures were performed by the Advanced Practitioner under my supervision and collaboration.  I have reviewed the Advanced Practitioner's note and chart, and I agree with the management and plan.  Leondra Cullin 06/15/2013 7:57 AM

## 2013-06-17 NOTE — Progress Notes (Signed)
Post discharge chart review completed.  

## 2013-07-16 ENCOUNTER — Ambulatory Visit: Payer: Medicaid Other | Admitting: Obstetrics & Gynecology

## 2013-08-19 ENCOUNTER — Ambulatory Visit: Payer: Medicaid Other | Admitting: Family Medicine

## 2013-11-06 ENCOUNTER — Encounter (HOSPITAL_COMMUNITY): Payer: Self-pay | Admitting: Emergency Medicine

## 2013-11-06 ENCOUNTER — Emergency Department (HOSPITAL_COMMUNITY)
Admission: EM | Admit: 2013-11-06 | Discharge: 2013-11-06 | Disposition: A | Discharge status: Home / Self Care | Payer: Medicaid Other | Attending: Emergency Medicine | Admitting: Emergency Medicine

## 2013-11-06 DIAGNOSIS — I10 Essential (primary) hypertension: Secondary | ICD-10-CM | POA: Insufficient documentation

## 2013-11-06 DIAGNOSIS — Z79899 Other long term (current) drug therapy: Secondary | ICD-10-CM | POA: Insufficient documentation

## 2013-11-06 DIAGNOSIS — Z8619 Personal history of other infectious and parasitic diseases: Secondary | ICD-10-CM | POA: Insufficient documentation

## 2013-11-06 DIAGNOSIS — K029 Dental caries, unspecified: Secondary | ICD-10-CM | POA: Insufficient documentation

## 2013-11-06 DIAGNOSIS — R221 Localized swelling, mass and lump, neck: Principal | ICD-10-CM

## 2013-11-06 DIAGNOSIS — R220 Localized swelling, mass and lump, head: Secondary | ICD-10-CM | POA: Insufficient documentation

## 2013-11-06 MED ORDER — PENICILLIN V POTASSIUM 500 MG PO TABS: 500.0000 mg | ORAL_TABLET | Freq: Four times a day (QID) | ORAL | Status: AC

## 2013-11-06 NOTE — Discharge Instructions (Signed)
Take the prescribed medication as directed. Follow-up with dentist-- referral provided and resource guide below to help with this. Return to the ED for new or worsening symptoms.   Emergency Department Resource Guide 1) Find a Doctor and Pay Out of Pocket Although you won't have to find out who is covered by your insurance plan, it is a good idea to ask around and get recommendations. You will then need to call the office and see if the doctor you have chosen will accept you as a new patient and what types of options they offer for patients who are self-pay. Some doctors offer discounts or will set up payment plans for their patients who do not have insurance, but you will need to ask so you aren't surprised when you get to your appointment.  2) Contact Your Local Health Department Not all health departments have doctors that can see patients for sick visits, but many do, so it is worth a call to see if yours does. If you don't know where your local health department is, you can check in your phone book. The CDC also has a tool to help you locate your state's health department, and many state websites also have listings of all of their local health departments.  3) Find a [facility] If your illness is not likely to be very severe or complicated, you may want to try a walk in clinic. These are popping up all over the country in pharmacies, drugstores, and shopping centers. They're usually staffed by nurse practitioners or physician assistants that have been trained to treat common illnesses and complaints. They're usually fairly quick and inexpensive. However, if you have serious medical issues or chronic medical problems, these are probably not your best option.  No Primary Care Doctor: - Call Health Connect at  [phone] - they can help you locate a primary care doctor that  accepts your insurance, provides certain services, etc. - Physician Referral Service- [phone]  Chronic Pain  Problems: Organization         Address  Phone   Notes  [facility]  [phone] Patients need to be referred by their primary care doctor.   Medication Assistance: Organization         Address  Phone   Notes  [facility] Medication [facility] [address] [place], [place] [address] [phone] --Must be a resident of [facility] -- Must have NO insurance coverage whatsoever (no Medicaid/ Medicare, etc.) -- The pt. MUST have a primary care doctor that directs their care regularly and follows them in the community   MedAssist  [phone]   [employer]  [phone]    Agencies that provide inexpensive medical care: Organization         Address  Phone   Notes  [place]  [phone]   [facility]    [phone]   [facility] [place],  [phone]   [place] [address], [place] [phone]   Planned Parenthood    [phone]   [facility]    [phone]   [place] and [place] [address], [place] Phone:  [phone], Fax:  [phone] Hours of Operation:  9 am - 6 pm, M-F.  Also accepts Medicaid/Medicare and self-pay.  [facility] for [place] [address] [place],  [address], [place] Phone: [phone], Fax: [phone]. Hours of Operation:  8:30 am - 5:30 pm, M-F.  Also accepts Medicaid and self-pay.  [facility] [place] [address] [place] Phone: [phone]   [place], [place], [place] [phone], Ext. [phone] Mondays & Thursdays: 7-9 AM.  First 15 patients are seen on a first come, first serve basis.    [place] Providers:  Organization         Address  Phone   Notes  [facility] [address], [place] [phone] Also  accepts self-pay patients.  [facility] [address], [place]  [phone]   [place], [address], [place] [phone]   [facility] [address] [place] [phone]   Lucianne Lei [address], [place]   [phone] Only accepts [place] Access [place] patients after they have their name applied to their card.   Self-Pay (no insurance) in [facility]:  Organization         Address  Phone   Notes  Sickle Cell Patients, [facility] Internal Medicine [place] [phone]   [facility] [place] [phone]   [facility] [place]  [place], [place], [place] [phone]   [facility]/Dr. Osei-Bonsu  [address] [place] [address], [place] or [address], [place] [phone] Phone number for both [place] and [place] locations is the same.  Urgent Medical and [facility] [address] [place] [phone]   [facility] [address], [place] or [address] [phone]   [facility] [address], [place] [phone], phone; [phone], fax Sees patients 1st and 3rd Saturday of every month.  Must not qualify for public or private insurance (i.e. Medicaid, Medicare, [place] Health Choice, Veterans' Benefits)  Household income should be no more than 200% of the poverty level The clinic cannot treat you if you are pregnant or think you are pregnant  Sexually transmitted diseases are not treated at the clinic.    Dental Care: Organization         Address  Phone  Notes  [facility] Department of [facility] [place] [phone] Accepts children up to age 14 who are enrolled in [place] or [place]; pregnant  women with a Medicaid card; and children who have applied for Medicaid or [place] Health Choice, but were declined, whose parents can pay a reduced fee at time of service.  [facility] Department of [facility]  [address], [place] [phone] Accepts children up to age 57 who are enrolled in [place] or [place]; pregnant women with a Medicaid card; and children who have applied for Medicaid or [place] Health Choice, but were declined, whose parents can pay a reduced fee at time of service.  [facility] Access PROGRAM  [place] [phone] Patients are seen by appointment only. Walk-ins are not accepted. [place] will see patients 65 years of age and older. Monday - Tuesday (8am-5pm) Most Wednesdays (8:30-5pm) $30 per visit, cash only  [place]  [address], [place] [phone] Patients are seen by appointment only. Walk-ins are not accepted. [place] will see patients 47 years of age and older. One Wednesday Evening (Monthly: Volunteer Based).  $30 per visit, cash only  [place]  [phone] for adults; Children under age 20, call Graduate Pediatric Dentistry at [phone]. Children aged 75-14, please call [phone] to request a pediatric application.  Dental services are provided in all areas of dental care including fillings, crowns and bridges, complete and partial dentures, implants, gum treatment, root canals, and extractions. Preventive care is also provided. Treatment is provided to both adults and children. Patients are selected via a lottery and there is often a waiting list.   [facility] [address], [place]  [phone] [email or web address]   [facility] [address] [place], [place] [phone], Ext. [phone] Second and Fourth Thursday of each month, opens at 6:30 AM; Clinic ends at 9 AM.  Patients are  seen on a first-come first-served basis, and a limited number are seen during each clinic.   [facility]  [address] Hillard Danker [place], [place] [phone]   Eligibility Requirements You must have lived in [place], [place], or [place] counties for at least the last three months.   You cannot be eligible for state or federal sponsored [employer], including [employer], [place], or [employer].   You generally cannot be eligible for healthcare insurance through your employer.    How to apply: Eligibility screenings are held every Tuesday and Wednesday afternoon from 1:00 pm until 4:00 pm. You do not need an appointment for the interview!  [facility] [address] [place], [place]   [place]  [place] Department  [place]  [phone]    Behavioral Health Resources in the Community: Intensive Outpatient Programs Organization         Address  Phone  Notes  [place]. [address] [place], [place] [phone]   [facility] [address] [place], [place]   ADS: Alcohol & Drug Svcs [address] [place], [place]   [place] [address]  [place], [place] or [phone]   Substance Abuse Resources Organization         Address  Phone  Notes  Alcohol and Drug Services  [phone]   [place]  [phone]   [facility] [place]   Chinita Pester  ([phone]   Residential & Outpatient Substance Abuse Program  ([phone]   Psychological Services Organization         Address  Phone  Notes  [facility] [place]  [place]  [phone]   [place] [address] [place] or [phone]    Mobile Crisis  Teams Organization         Address  Phone  Notes  [facility] Alternatives, Mobile Crisis Care Unit  [phone]   [facility]ive Psychotherapeutic Services  [address] [place], [place]   [place] [address] [place] [phone]    Self-Help/Support Groups Organization         Address  Phone             Notes  [place]. of [place] -  variety of support groups  336- (276)437-4994 Call for more information  Narcotics Anonymous (NA), [facility]aring Services [address], [employer] [place]  2 meetings at this location   Residential Facilities manager         Address  Phone  Notes  [facility] [place],    [place]  [phone]   [facility]  [address], [place] 361443, [place], [place]   [place], [place] [phone] Admissions: 8am-3pm M-F  Incentives Substance [place] [address],    [place], [place] [phone]   [facility] [address] [place], [place], [place]   [facility] [address],  [place], [place]   Insight Programs - Intensive Outpatient [address]een [address], [place], [place]   [facility] ([place] [place].,  [place], [place] [phone] or [phone]   [facility]idential Treatment Services (RTS) [address], [place], [place] Accepts Medicaid  [facility] [place] [address]  [place] [phone] Substance Abuse/Addiction Treatment   [facility] Organization         Address  Phone  Notes  [facility]  [phone]   Domenic Schwab, PhD [address] Arlis Porta [place], [place]   [phone] or [phone]   [place] [facility] [place] [place], [place] [phone]   [facility] [address] [place], [place] [phone]  Insurance/Medicaid/sponsorship through [facility] and Families [address] [place]                                    [place], [place] [phone] [place] [address], [place] [phone]    Dr. Adele Schilder  [phone]   [facility] Dept. 1) [address] [place] 2) [place] 3)  [place] [address], [place] [phone]  [phone]   [place] [phone] or [phone] (After Hours)

## 2013-11-06 NOTE — ED Provider Notes (Signed)
Medical screening examination/treatment/procedure(s) were performed by non-physician practitioner and as supervising physician I was immediately available for consultation/collaboration.  EKG Interpretation   None         [name], MD 11/06/13 [phone]

## 2013-11-06 NOTE — ED Notes (Addendum)
Pt with swelling, numbness and some pain to L lower jaw and gum.  BP elevated - pt states she does have a bp problem and she is in pain.

## 2013-11-06 NOTE — ED Provider Notes (Signed)
CSN: [id number]     Arrival date & time 11/06/13  1007 History   First MD Initiated Contact with Patient 11/06/13 1026    This chart was scribed for Meredith Carnes PA-C, a non-physician practitioner working with Meredith Essex, MD by Meredith Chen, ED Scribe. This patient was seen in room TR06C/TR06C and the patient's care was started at 10:41 AM     Chief Complaint  Patient presents with  . Dental Pain   (Consider location/radiation/quality/duration/timing/severity/associated sxs/prior Treatment) The history is provided by the patient. No language interpreter was used.   HPI Comments: Meredith Chen is a 33 y.o. female who presents to the Emergency Department with PMHx of HTN complaining of constant moderate left lower dental pain onset yesterday. Reports associated worsening left sided facial swelling but states pain seems to be improving.  Denies any aggravating or alleviating factors. Denies associated fever, sore throat, neck pain, or dysphagia. States she is breast-feeding. No meds taken PTA.  Pt is not currently established with a dentist.  Past Medical History  Diagnosis Date  . Abnormal Pap smear   . Chlamydia   . Trichomonas     January 2014  . Medical history non-contributory    Past Surgical History  Procedure Laterality Date  . Cesarean section     Family History  Problem Relation Age of Onset  . Kidney disease Mother   . Heart disease Son   . Diabetes Maternal Aunt    History  Substance Use Topics  . Smoking status: Never Smoker   . Smokeless tobacco: Never Used  . Alcohol Use: No     Comment: social   OB History   Grav Para Term Preterm Abortions TAB SAB Ect Mult Living   5 4 4  1 1   1 5      Review of Systems  Constitutional: Negative for fever.  HENT: Positive for dental problem and facial swelling. Negative for sore throat and trouble swallowing.   Psychiatric/Behavioral: Negative for confusion.  All other systems reviewed and are  negative.    Allergies  Review of patient's allergies indicates no known allergies.  Home Medications   Current Outpatient Rx  Name  Route  Sig  Dispense  Refill  . Acetaminophen-Codeine (TYLENOL/CODEINE #3) 300-30 MG per tablet   Oral   Take 1 tablet by mouth every 4 (four) hours as needed for pain.   30 tablet   0   . docusate sodium (COLACE) 100 MG capsule   Oral   Take 1 capsule (100 mg total) by mouth 2 (two) times daily as needed for constipation.   30 capsule   2   . Fe Fum-FePoly-FA-Vit C-Vit B3 (INTEGRA F) 125-1 MG CAPS   Oral   Take 1 tablet by mouth daily.   30 capsule   1   . ibuprofen (ADVIL,MOTRIN) 600 MG tablet   Oral   Take 1 tablet (600 mg total) by mouth every 6 (six) hours as needed (pain scale < 4).   30 tablet   0   . Prenatal Vit-Fe Fumarate-FA (PRENATAL VITAMINS PLUS) 27-1 MG TABS   Oral   Take 1 tablet by mouth daily.          BP 167/114  Pulse 102  Temp(Src) 98.3 F (36.8 C) (Oral)  Resp 18  Ht 5\' 9"  (1.753 m)  Wt 207 lb (93.895 kg)  BMI 30.55 kg/m2  SpO2 95%  Physical Exam  Nursing note and vitals reviewed. Constitutional: She is  oriented to person, place, and time. She appears well-developed and well-nourished. No distress.  HENT:  Head: Normocephalic and atraumatic.  Mouth/Throat: Uvula is midline and mucous membranes are normal. No oral lesions. No trismus in the jaw. Abnormal dentition. Dental caries present. No dental abscesses. No oropharyngeal exudate, posterior oropharyngeal edema, posterior oropharyngeal erythema or tonsillar abscesses.  Teeth largely in poor dentition with multiple cavities and broken teeth, left lower gums and cheek swollen without definitive abscess or extension into neck; no swelling of tongue or lips, handling secretions appropriately, no trismus; no difficulty swallowing  Eyes: Conjunctivae and EOM are normal. Pupils are equal, round, and reactive to light.  Neck: Normal range of motion.   Cardiovascular: Normal rate, regular rhythm and normal heart sounds.   Pulmonary/Chest: Effort normal and breath sounds normal. No respiratory distress. She has no wheezes.  Musculoskeletal: Normal range of motion.  Neurological: She is alert and oriented to person, place, and time.  Skin: Skin is warm and dry. She is not diaphoretic.  Psychiatric: She has a normal mood and affect.    ED Course  Procedures   COORDINATION OF CARE:  Nursing notes reviewed. Vital signs reviewed. Initial pt interview and examination performed.   10:43 AM-Discussed treatment plan with pt at bedside. Pt agrees with plan.   Treatment plan initiated:Medications - No data to display   Initial diagnostic testing ordered.    Labs Review Labs Reviewed - No data to display Imaging Review No results found.  EKG Interpretation   None       MDM   1. Left facial swelling    Left facial swelling without extension into neck.  No tongue or lip swelling.  Sx likely stemming from dental infection-- will start on abx and have pt FU with dentist, referral and resource guide provided.  Discussed plan with pt, they agreed.  Return precautions advised.  I personally performed the services described in this documentation, which was scribed in my presence. The recorded information has been reviewed and is accurate.  Meredith Pickett, PA-C 11/06/13 1228

## 2014-08-16 ENCOUNTER — Encounter (HOSPITAL_COMMUNITY): Payer: Self-pay | Admitting: Emergency Medicine

## 2014-10-28 ENCOUNTER — Encounter (HOSPITAL_COMMUNITY): Payer: Self-pay | Admitting: Family Medicine

## 2015-03-24 ENCOUNTER — Encounter (HOSPITAL_COMMUNITY): Payer: Self-pay | Admitting: Family Medicine

## 2015-07-14 ENCOUNTER — Encounter (HOSPITAL_COMMUNITY): Payer: Self-pay

## 2015-07-14 ENCOUNTER — Emergency Department (HOSPITAL_COMMUNITY)
Admission: EM | Admit: 2015-07-14 | Discharge: 2015-07-14 | Disposition: A | Discharge status: Home / Self Care | Payer: Medicaid Other | Attending: Emergency Medicine | Admitting: Emergency Medicine

## 2015-07-14 DIAGNOSIS — H00015 Hordeolum externum left lower eyelid: Secondary | ICD-10-CM | POA: Insufficient documentation

## 2015-07-14 DIAGNOSIS — H00016 Hordeolum externum left eye, unspecified eyelid: Secondary | ICD-10-CM

## 2015-07-14 DIAGNOSIS — Z8619 Personal history of other infectious and parasitic diseases: Secondary | ICD-10-CM | POA: Insufficient documentation

## 2015-07-14 NOTE — Discharge Instructions (Signed)
Sty A sty (hordeolum) is an infection of a gland in the eyelid located at the base of the eyelash. A sty may develop a white or yellow head of pus. It can be puffy (swollen). Usually, the sty will burst and pus will come out on its own. They do not leave lumps in the eyelid once they drain. A sty is often confused with another form of cyst of the eyelid called a chalazion. Chalazions occur within the eyelid and not on the edge where the bases of the eyelashes are. They often are red, sore and then form firm lumps in the eyelid. CAUSES   Germs (bacteria).  Lasting (chronic) eyelid inflammation. SYMPTOMS   Tenderness, redness and swelling along the edge of the eyelid at the base of the eyelashes.  Sometimes, there is a white or yellow head of pus. It may or may not drain. DIAGNOSIS  An ophthalmologist will be able to distinguish between a sty and a chalazion and treat the condition appropriately.  TREATMENT   Styes are typically treated with warm packs (compresses) until drainage occurs.  In rare cases, medicines that kill germs (antibiotics) may be prescribed. These antibiotics may be in the form of drops, cream or pills.  If a hard lump has formed, it is generally necessary to do a small incision and remove the hardened contents of the cyst in a minor surgical procedure done in the office.  In suspicious cases, your caregiver may send the contents of the cyst to the lab to be certain that it is not a rare, but dangerous form of cancer of the glands of the eyelid. HOME CARE INSTRUCTIONS   Wash your hands often and dry them with a clean towel. Avoid touching your eyelid. This may spread the infection to other parts of the eye.  Apply heat to your eyelid for 10 to 20 minutes, several times a day, to ease pain and help to heal it faster.  Do not squeeze the sty. Allow it to drain on its own. Wash your eyelid carefully 4 times per day to remove any pus. SEEK IMMEDIATE MEDICAL CARE IF:    Your eye becomes painful or puffy (swollen).  Your vision changes.  Your sty does not drain by itself within 3 days.  Your sty comes back within a short period of time, even with treatment.  You have redness (inflammation) around the eye.  You have a fever. Document Released: 07/11/2005 Document Revised: 12/24/2011 Document Reviewed: 01/15/2014 [facility] Patient Information 2015 [place], [place]. This information is not intended to replace advice given to you by your health care provider. Make sure you discuss any questions you have with your health care provider.

## 2015-07-14 NOTE — ED Provider Notes (Signed)
CSN: [id number]     Arrival date & time 07/14/15  0919 History   First MD Initiated Contact with Patient 07/14/15 [phone]     Chief Complaint  Patient presents with  . Eye Drainage  . Facial Swelling     (Consider location/radiation/quality/duration/timing/severity/associated sxs/prior Treatment) Patient is a 34 y.o. female presenting with eye problem.  Eye Problem Location:  L eye Severity:  Mild Duration:  3 days Timing:  Constant Progression:  Worsening Chronicity:  New Context: not chemical exposure, not direct trauma and not scratch   Context comment:  Son has hx of abscess Exacerbated by: palpation, putting head down. Ineffective treatments: warm compress bid. Associated symptoms: discharge (in morning, left eye), foreign body sensation, headaches and itching (when waking)   Associated symptoms: no blurred vision, no decreased vision, no double vision, no nausea, no numbness, no photophobia, no vomiting and no weakness   Associated symptoms comment:  Lightheaded when putting head down and bringing it back up  Risk factors: not exposed to pinkeye and no previous injury to eye     Past Medical History  Diagnosis Date  . Abnormal Pap smear   . Chlamydia   . Trichomonas     January 2014  . Medical history non-contributory    Past Surgical History  Procedure Laterality Date  . Cesarean section     Family History  Problem Relation Age of Onset  . Kidney disease Mother   . Heart disease Son   . Diabetes Maternal Aunt    Social History  Substance Use Topics  . Smoking status: Never Smoker   . Smokeless tobacco: Never Used  . Alcohol Use: Yes     Comment: social   OB History    Gravida Para Term Preterm AB TAB SAB Ectopic Multiple Living   5 4 4  1 1   1 5      Review of Systems  Constitutional: Negative for fever.  HENT: Negative for congestion, ear discharge and sore throat.   Eyes: Positive for discharge (in morning, left eye) and itching (when waking).  Negative for blurred vision, double vision, photophobia and visual disturbance.  Respiratory: Negative for cough and shortness of breath.   Cardiovascular: Negative for chest pain.  Gastrointestinal: Negative for nausea, vomiting and abdominal pain.  Genitourinary: Negative for difficulty urinating.  Musculoskeletal: Negative for back pain and neck pain.  Skin: Negative for rash.  Neurological: Positive for headaches. Negative for syncope, weakness and numbness.      Allergies  Review of patient's allergies indicates no known allergies.  Home Medications   Prior to Admission medications   Not on File   BP 150/90 mmHg  Pulse 93  Temp(Src) 98.7 F (37.1 C) (Oral)  Resp 20  SpO2 100% Physical Exam  Constitutional: She is oriented to person, place, and time. She appears well-developed and well-nourished. No distress.  HENT:  Head: Normocephalic and atraumatic.  Eyes: Conjunctivae and EOM are normal. Pupils are equal, round, and reactive to light. Right eye exhibits no discharge. Left eye exhibits hordeolum (left lower eyelid with irritation/swelling medial side, no surrounding erythema). Left eye exhibits no discharge. Right conjunctiva is not injected. Right conjunctiva has no hemorrhage. Left conjunctiva is not injected. Left conjunctiva has no hemorrhage. Right eye exhibits normal extraocular motion. Left eye exhibits normal extraocular motion. Right pupil is round and reactive. Left pupil is round and reactive. Pupils are equal.  Neck: Normal range of motion.  Cardiovascular: Normal rate, regular rhythm, normal heart  sounds and intact distal pulses.  Exam reveals no gallop and no friction rub.   No murmur heard. Pulmonary/Chest: Effort normal and breath sounds normal. No respiratory distress. She has no wheezes. She has no rales.  Abdominal: Soft. She exhibits no distension. There is no tenderness. There is no guarding.  Musculoskeletal: She exhibits no edema or tenderness.   Neurological: She is alert and oriented to person, place, and time.  Skin: Skin is warm and dry. No rash noted. She is not diaphoretic. No erythema.  Nursing note and vitals reviewed.   ED Course  Procedures (including critical care time) Labs Review Labs Reviewed - No data to display  Imaging Review No results found. I have personally reviewed and evaluated these images and lab results as part of my medical decision-making.   EKG Interpretation None      MDM   Final diagnoses:  None   34 year old female with no significant medical history presents with concern of left lower eyelid swelling and irritation. Patient with evidence of the lower lid hordeolum on exam. No sign of surrounding cellulitis.  No history to suggest orbital cellulitis including patient afebrile, no visual changes no pain with eye movements, no photophobia.  No hx of eye trauma or eye pain and doubt abrasion.  Pt with irritation of eye in area of hordeolum, likely secondary to this.  Recommend 4 times a day warm compresses, and follow up with ophthalmology if symptoms do not improve.  Patient discharged in stable condition with understanding of reasons to return.   Gareth Morgan, MD 07/14/15 [phone]

## 2015-07-14 NOTE — ED Notes (Signed)
Pt c/o L eye drainage and L lower eyelid swelling x 3 days.  Denies pain.  Slight swelling noted.  Pt is concerned that it is an abscess, due to having a Hx of same.

## 2015-07-14 NOTE — ED Notes (Signed)
Pt verbalized understanding of discharge orders. NAD at this time.

## 2015-12-03 ENCOUNTER — Emergency Department (HOSPITAL_COMMUNITY)
Admission: EM | Admit: 2015-12-03 | Discharge: 2015-12-03 | Disposition: A | Discharge status: Home / Self Care | Payer: Medicaid Other | Attending: Emergency Medicine | Admitting: Emergency Medicine

## 2015-12-03 ENCOUNTER — Encounter (HOSPITAL_COMMUNITY): Payer: Self-pay | Admitting: *Deleted

## 2015-12-03 DIAGNOSIS — Y998 Other external cause status: Secondary | ICD-10-CM | POA: Insufficient documentation

## 2015-12-03 DIAGNOSIS — X58XXXA Exposure to other specified factors, initial encounter: Secondary | ICD-10-CM | POA: Insufficient documentation

## 2015-12-03 DIAGNOSIS — W57XXXA Bitten or stung by nonvenomous insect and other nonvenomous arthropods, initial encounter: Secondary | ICD-10-CM

## 2015-12-03 DIAGNOSIS — Y9289 Other specified places as the place of occurrence of the external cause: Secondary | ICD-10-CM | POA: Insufficient documentation

## 2015-12-03 DIAGNOSIS — S60562A Insect bite (nonvenomous) of left hand, initial encounter: Secondary | ICD-10-CM | POA: Insufficient documentation

## 2015-12-03 DIAGNOSIS — Z8619 Personal history of other infectious and parasitic diseases: Secondary | ICD-10-CM | POA: Insufficient documentation

## 2015-12-03 DIAGNOSIS — Y9389 Activity, other specified: Secondary | ICD-10-CM | POA: Insufficient documentation

## 2015-12-03 MED ORDER — IBUPROFEN 800 MG PO TABS: 800.0000 mg | ORAL_TABLET | Freq: Three times a day (TID) | ORAL | Status: DC

## 2015-12-03 NOTE — ED Notes (Signed)
Patient c/o right hand swelling and tightness, denies trauma to hand.

## 2015-12-03 NOTE — Discharge Instructions (Signed)
Insect Bite Mosquitoes, flies, fleas, bedbugs, and many other insects can bite. Insect bites are different from insect stings. A sting is when poison (venom) is injected into the skin. Insect bites can cause pain or itching for a few days, but they are usually not serious. Some insects can spread diseases to people through a bite. SYMPTOMS  Symptoms of an insect bite include:  Itching or pain in the bite area.  Redness and swelling in the bite area.  An open wound (skin ulcer). In many cases, symptoms last for 2-4 days.  DIAGNOSIS  This condition is usually diagnosed based on symptoms and a physical exam. TREATMENT  Treatment is usually not needed for an insect bite. Symptoms often go away on their own. Your health care provider may recommend creams or lotions to help reduce itching. Antibiotic medicines may be prescribed if the bite becomes infected. A tetanus shot may be given in some cases. If you develop an allergic reaction to an insect bite, your health care provider will prescribe medicines to treat the reaction (antihistamines). This is rare. HOME CARE INSTRUCTIONS  Do not scratch the bite area.  Keep the bite area clean and dry. Wash the bite area daily with soap and water as told by your health care provider.  If directed, applyice to the bite area.  Put ice in a plastic bag.  Place a towel between your skin and the bag.  Leave the ice on for 20 minutes, 2-3 times per day.  To help reduce itching and swelling, try applying a baking soda paste, cortisone cream, or calamine lotion to the bite area as told by your health care provider.  Apply or take over-the-counter and prescription medicines only as told by your health care provider.  If you were prescribed an antibiotic medicine, use it as told by your health care provider. Do not stop using the antibiotic even if your condition improves.  Keep all follow-up visits as told by your health care provider. This is  important. PREVENTION   Use insect repellent. The best insect repellents contain:  DEET, picaridin, oil of lemon eucalyptus (OLE), or IR3535.  Higher amounts of an active ingredient.  When you are outdoors, wear clothing that covers your arms and legs.  Avoid opening windows that do not have window screens. SEEK MEDICAL CARE IF:  You have increased redness, swelling, or pain in the bite area.  You have a fever. SEEK IMMEDIATE MEDICAL CARE IF:   You have joint pain.   You have fluid, blood, or pus coming from the bite area.  You have a headache or neck pain.  You have unusual weakness.  You have a rash.  You have chest pain or shortness of breath.  You have abdominal pain, nausea, or vomiting.  You feel unusually tired or sleepy.   This information is not intended to replace advice given to you by your health care provider. Make sure you discuss any questions you have with your health care provider.   Document Released: 11/08/2004 Document Revised: 06/22/2015 Document Reviewed: 02/16/2015 Elsevier Interactive Patient Education 2016 [employer]  

## 2015-12-03 NOTE — ED Notes (Signed)
Pt complains of pain and swelling in her left hand since yesterday. Pt states she initially noticed a red bump on her hand yesterday. Pt states she does not recall injuring her hand.

## 2015-12-03 NOTE — ED Notes (Signed)
Patient d/c'd self care.  F/U and medications reviewed.  Patient verbalized understanding. 

## 2015-12-03 NOTE — ED Provider Notes (Signed)
CSN: [id number]     Arrival date & time 12/03/15  2106 History  By signing my name below, I, Meredith Chen, attest that this documentation has been prepared under the direction and in the presence of Charlann Lange, PA-C. Electronically Signed: Judithann Sauger, ED Scribe. 12/03/2015. 11:18 PM.    Chief Complaint  Patient presents with  . Hand Pain   The history is provided by the patient. No language interpreter was used.   HPI Comments: Meredith Chen is a 35 y.o. female who presents to the Emergency Department complaining of gradually worsening constant moderate left hand pain and swelling onset yesterday. She reports associated red bump and redness to the area. She explains that she felt a sting immediately before onset of the pain when she was outside playing with her children. No alleviating factors noted. Pt has not tried any medications. She denies any recent injuries or trauma to the hands. She denies an allergy to bees. No fever, chills, or n/v.    Past Medical History  Diagnosis Date  . Abnormal Pap smear   . Chlamydia   . Trichomonas     January 2014  . Medical history non-contributory    Past Surgical History  Procedure Laterality Date  . Cesarean section     Family History  Problem Relation Age of Onset  . Kidney disease Mother   . Heart disease Son   . Diabetes Maternal Aunt    Social History  Substance Use Topics  . Smoking status: Never Smoker   . Smokeless tobacco: Never Used  . Alcohol Use: Yes     Comment: social   OB History    Gravida Para Term Preterm AB TAB SAB Ectopic Multiple Living   5 4 4  1 1   1 5      Review of Systems  Constitutional: Negative for fever and chills.  Gastrointestinal: Negative for nausea and vomiting.  Musculoskeletal: Positive for arthralgias.  Skin: Positive for color change.      Allergies  Review of patient's allergies indicates no known allergies.  Home Medications   Prior to Admission medications    Medication Sig Start Date End Date Taking? Authorizing Provider  ibuprofen (ADVIL,MOTRIN) 800 MG tablet Take 1 tablet (800 mg total) by mouth 3 (three) times daily. 12/03/15   Dalton Mille, PA-C   BP 154/103 mmHg  Pulse 102  Temp(Src) 98.7 F (37.1 C) (Oral)  Resp 18  SpO2 100%  LMP 12/03/2015 Physical Exam  Constitutional: She is oriented to person, place, and time. She appears well-developed and well-nourished.  HENT:  Head: Normocephalic and atraumatic.  Cardiovascular: Normal rate.   Pulmonary/Chest: Effort normal.  Abdominal: She exhibits no distension.  Musculoskeletal:  Left hand has redness and swelling that affects the dorsum 4th finger into the dorsum of hand 2 raised bumps associated within the redness, consistent with insect bite, no blistering or drainage. Full ROM of joints of the hand.   Neurological: She is alert and oriented to person, place, and time.  Skin: Skin is warm and dry.  Psychiatric: She has a normal mood and affect.  Nursing note and vitals reviewed.   ED Course  Procedures (including critical care time) DIAGNOSTIC STUDIES: Oxygen Saturation is 100% on RA, normal by my interpretation.    COORDINATION OF CARE: 11:08 PM- Pt advised of plan for treatment and pt agrees. Advised to use Ibuprofen and ice packs for swelling.   MDM   Final diagnoses:  Insect bite hand, left,  initial encounter    Uncomplicated hand swelling from bite/sting of insect.  I personally performed the services described in this documentation, which was scribed in my presence. The recorded information has been reviewed and is accurate.     Charlann Lange, PA-C 12/04/15 0104  Virgel Manifold, MD 12/08/15 [phone]

## 2018-02-17 DIAGNOSIS — H00015 Hordeolum externum left lower eyelid: Secondary | ICD-10-CM | POA: Insufficient documentation

## 2018-02-18 ENCOUNTER — Encounter (HOSPITAL_COMMUNITY): Payer: Self-pay

## 2018-02-18 ENCOUNTER — Emergency Department (HOSPITAL_COMMUNITY)
Admission: EM | Admit: 2018-02-18 | Discharge: 2018-02-18 | Disposition: A | Discharge status: Home / Self Care | Payer: Self-pay | Attending: Emergency Medicine | Admitting: Emergency Medicine

## 2018-02-18 DIAGNOSIS — H00015 Hordeolum externum left lower eyelid: Secondary | ICD-10-CM

## 2018-02-18 MED ORDER — ERYTHROMYCIN 5 MG/GM OP OINT
TOPICAL_OINTMENT | Freq: Once | OPHTHALMIC | Status: AC
  Administered 2018-02-18: 04:00:00 via OPHTHALMIC
  Filled 2018-02-18: qty 3.5

## 2018-02-18 MED ORDER — ARTIFICIAL TEARS OPHTHALMIC OINT: TOPICAL_OINTMENT | OPHTHALMIC | 0 refills | Status: DC | PRN

## 2018-02-18 NOTE — ED Triage Notes (Signed)
Pt called from triage with no answer 

## 2018-02-18 NOTE — ED Triage Notes (Signed)
Pt complains of a stye in her left eye for two days

## 2018-02-18 NOTE — Discharge Instructions (Addendum)
Continue to apply warm compresses to the region. Apply the erythromycin ointment 4 times a day for the next 5 days. Should you need additional protection from irritation, may use the lubricating eye ointment.  Follow-up with ophthalmology on this matter as soon as possible.  Call the number provided to set up an appointment.

## 2018-02-18 NOTE — ED Provider Notes (Signed)
[facility] DEPT Provider Note   CSN: [id number] Arrival date & time: 02/17/18  2151     History   Chief Complaint Chief Complaint  Patient presents with  . Eye Pain    HPI Meredith Chen is a 37 y.o. female.  HPI   Meredith Chen is a 37 y.o. female, presenting to the ED with swelling and irritation to the left lower eyelid for the past week.  She has been applying warm compresses.  She does not wear contact lenses.  Denies vision abnormalities, fever, drainage, or any other complaints.      Past Medical History:  Diagnosis Date  . Abnormal Pap smear   . Chlamydia   . Medical history non-contributory   . Trichomonas    January 2014    Patient Active Problem List   Diagnosis Date Noted  . Pyuria 04/27/2013  . Cough 04/13/2013  . H/O cesarean section complicating pregnancy 81/44/8185  . Pregnancy with other poor obstetric history(V23.49) 03/16/2013  . Elevated liver function tests in pregnancy 02/03/2013  . Di/Di twins, antepartum 02/02/2013  . Elevated blood pressure complicating pregnancy, antepartum 02/02/2013  . Rubella non-immune status, antepartum 01/01/2013  . Anemia 01/01/2013    Past Surgical History:  Procedure Laterality Date  . CESAREAN SECTION       OB History    Gravida  5   Para  4   Term  4   Preterm      AB  1   Living  5     SAB      TAB  1   Ectopic      Multiple  1   Live Births  5            Home Medications    Prior to Admission medications   Medication Sig Start Date End Date Taking? Authorizing Provider  artificial tears (LACRILUBE) OINT ophthalmic ointment Place into the left eye every 4 (four) hours as needed for dry eyes. 02/18/18   Joy, Shawn C, PA-C  ibuprofen (ADVIL,MOTRIN) 800 MG tablet Take 1 tablet (800 mg total) by mouth 3 (three) times daily. 12/03/15   Charlann Lange, PA-C    Family History Family History  Problem Relation Age of Onset  . Kidney disease Mother    . Heart disease Son   . Diabetes Maternal Aunt     Social History Social History   Tobacco Use  . Smoking status: Never Smoker  . Smokeless tobacco: Never Used  Substance Use Topics  . Alcohol use: Yes    Comment: social  . Drug use: No     Allergies   Patient has no known allergies.   Review of Systems Review of Systems  Constitutional: Negative for fever.  Eyes: Positive for pain. Negative for discharge.     Physical Exam Updated Vital Signs BP (!) 151/107 (BP Location: Left Arm)   Pulse 80   Temp 99 F (37.2 C) (Oral)   Resp 15   Ht 5\' 9"  (1.753 m)   Wt 105.1 kg (231 lb 11.2 oz)   LMP 02/18/2018   SpO2 100%   BMI 34.22 kg/m   Physical Exam  Constitutional: She appears well-developed and well-nourished. No distress.  HENT:  Head: Normocephalic and atraumatic.  Eyes: Pupils are equal, round, and reactive to light. Conjunctivae and EOM are normal. Left eye exhibits hordeolum.    Hordeolum to the left lower lid.  Tender to the touch.  Patient is  able to fully open and close her lids.  No noted drainage from the region.  Neck: Neck supple.  Cardiovascular: Normal rate and regular rhythm.  Pulmonary/Chest: Effort normal.  Neurological: She is alert.  Skin: Skin is warm and dry. She is not diaphoretic. No pallor.  Psychiatric: She has a normal mood and affect. Her behavior is normal.  Nursing note and vitals reviewed.    ED Treatments / Results  Labs (all labs ordered are listed, but only abnormal results are displayed) Labs Reviewed - No data to display  EKG None  Radiology No results found.  Procedures Procedures (including critical care time)  Medications Ordered in ED Medications  erythromycin ophthalmic ointment (has no administration in time range)     Initial Impression / Assessment and Plan / ED Course  I have reviewed the triage vital signs and the nursing notes.  Pertinent labs & imaging results that were available during my  care of the patient were reviewed by me and considered in my medical decision making (see chart for details).     Patient presents with a hordeolum to the left lower lid.  No drainage from the region.  No noted eye dysfunction.  Ophthalmologist follow-up due to size of the hordeolum.  Erythromycin ointment initiated due to possible irritation caused to the conjunctiva from the hordeolum.  Final Clinical Impressions(s) / ED Diagnoses   Final diagnoses:  Hordeolum externum of left lower eyelid    ED Discharge Orders        Ordered    artificial tears (LACRILUBE) OINT ophthalmic ointment  Every 4 hours PRN     02/18/18 [place], Shawn C, PA-C 02/18/18 0341    Orpah Greek, MD 02/19/18 [phone]

## 2020-07-09 ENCOUNTER — Inpatient Hospital Stay (HOSPITAL_COMMUNITY): Payer: Medicaid Other

## 2020-07-09 ENCOUNTER — Encounter (HOSPITAL_COMMUNITY): Payer: Self-pay | Admitting: Obstetrics & Gynecology

## 2020-07-09 ENCOUNTER — Inpatient Hospital Stay (HOSPITAL_COMMUNITY)
Admission: AD | Admit: 2020-07-09 | Discharge: 2020-07-09 | Disposition: A | Discharge status: Home / Self Care | Payer: Medicaid Other | Attending: Obstetrics & Gynecology | Admitting: Obstetrics & Gynecology

## 2020-07-09 ENCOUNTER — Other Ambulatory Visit: Payer: Self-pay

## 2020-07-09 DIAGNOSIS — Z3491 Encounter for supervision of normal pregnancy, unspecified, first trimester: Secondary | ICD-10-CM

## 2020-07-09 DIAGNOSIS — O468X1 Other antepartum hemorrhage, first trimester: Secondary | ICD-10-CM | POA: Diagnosis not present

## 2020-07-09 DIAGNOSIS — Z3A01 Less than 8 weeks gestation of pregnancy: Secondary | ICD-10-CM | POA: Diagnosis not present

## 2020-07-09 DIAGNOSIS — D259 Leiomyoma of uterus, unspecified: Secondary | ICD-10-CM | POA: Insufficient documentation

## 2020-07-09 DIAGNOSIS — O208 Other hemorrhage in early pregnancy: Secondary | ICD-10-CM | POA: Diagnosis present

## 2020-07-09 DIAGNOSIS — O09521 Supervision of elderly multigravida, first trimester: Secondary | ICD-10-CM | POA: Insufficient documentation

## 2020-07-09 DIAGNOSIS — O3411 Maternal care for benign tumor of corpus uteri, first trimester: Secondary | ICD-10-CM | POA: Diagnosis not present

## 2020-07-09 DIAGNOSIS — O209 Hemorrhage in early pregnancy, unspecified: Secondary | ICD-10-CM

## 2020-07-09 DIAGNOSIS — O418X10 Other specified disorders of amniotic fluid and membranes, first trimester, not applicable or unspecified: Secondary | ICD-10-CM

## 2020-07-09 LAB — WET PREP, GENITAL
Clue Cells Wet Prep HPF POC: NONE SEEN
Trich, Wet Prep: NONE SEEN
Yeast Wet Prep HPF POC: NONE SEEN

## 2020-07-09 LAB — URINALYSIS, ROUTINE W REFLEX MICROSCOPIC
Bacteria, UA: NONE SEEN
Bilirubin Urine: NEGATIVE
Glucose, UA: NEGATIVE mg/dL
Ketones, ur: NEGATIVE mg/dL
Leukocytes,Ua: NEGATIVE
Nitrite: NEGATIVE
Protein, ur: 30 mg/dL — AB
Specific Gravity, Urine: 1.021 (ref 1.005–1.030)
pH: 5 (ref 5.0–8.0)

## 2020-07-09 LAB — CBC
HCT: 32 % — ABNORMAL LOW (ref 36.0–46.0)
Hemoglobin: 9.2 g/dL — ABNORMAL LOW (ref 12.0–15.0)
MCH: 19.4 pg — ABNORMAL LOW (ref 26.0–34.0)
MCHC: 28.8 g/dL — ABNORMAL LOW (ref 30.0–36.0)
MCV: 67.4 fL — ABNORMAL LOW (ref 80.0–100.0)
Platelets: 452 10*3/uL — ABNORMAL HIGH (ref 150–400)
RBC: 4.75 MIL/uL (ref 3.87–5.11)
RDW: 20.8 % — ABNORMAL HIGH (ref 11.5–15.5)
WBC: 6.8 10*3/uL (ref 4.0–10.5)
nRBC: 0 % (ref 0.0–0.2)

## 2020-07-09 LAB — HCG, QUANTITATIVE, PREGNANCY: hCG, Beta Chain, Quant, S: 48413 m[IU]/mL — ABNORMAL HIGH (ref <5)

## 2020-07-09 LAB — POCT PREGNANCY, URINE: Preg Test, Ur: POSITIVE — AB

## 2020-07-09 NOTE — MAU Note (Signed)
Meredith Chen is a 39 y.o. here in MAU reporting: this AM when using the bathroom saw some bleeding on the toilet paper with a clot. Has continued to see some lighter bleeding. No pain. + UPT 2 weeks ago. Had IC last night.  LMP: 06/01/20  Onset of complaint: today  Pain score: 0/10  Vitals:   07/09/20 1021  BP: 137/85  Pulse: (!) 122  Resp: 16  Temp: 98.8 F (37.1 C)  SpO2: 100%     Lab orders placed from triage: UPT UA

## 2020-07-09 NOTE — MAU Provider Note (Signed)
History     CSN: [id number]  Arrival date and time: 07/09/20 1093   First Provider Initiated Contact with Patient 07/09/20 1053      Chief Complaint  Patient presents with  . Vaginal Bleeding   HPI Meredith Chen is a 39 y.o. [id number] at [redacted]w[redacted]d who presents with vaginal bleeding. She states she sees bright red when she wipes. She reports last intercourse was last night. She denies any pain or abnormal discharge.   OB History    Gravida  6   Para  4   Term  4   Preterm      AB  1   Living  5     SAB      TAB  1   Ectopic      Multiple  1   Live Births  5           Past Medical History:  Diagnosis Date  . Abnormal Pap smear   . Chlamydia   . Medical history non-contributory   . Trichomonas    January 2014    Past Surgical History:  Procedure Laterality Date  . CESAREAN SECTION      Family History  Problem Relation Age of Onset  . Kidney disease Mother   . Heart disease Son   . Diabetes Maternal Aunt     Social History   Tobacco Use  . Smoking status: Never Smoker  . Smokeless tobacco: Never Used  Substance Use Topics  . Alcohol use: Not Currently    Comment: social  . Drug use: No    Allergies: No Known Allergies  Facility-Administered Medications Prior to Admission  Medication Dose Route Frequency Provider Last Rate Last Admin  . TDaP (BOOSTRIX) injection 0.5 mL  0.5 mL Intramuscular Once Donnamae Jude, MD       Medications Prior to Admission  Medication Sig Dispense Refill Last Dose  . artificial tears (LACRILUBE) OINT ophthalmic ointment Place into the left eye every 4 (four) hours as needed for dry eyes. 1 g 0   . ibuprofen (ADVIL,MOTRIN) 800 MG tablet Take 1 tablet (800 mg total) by mouth 3 (three) times daily. 21 tablet 0     Review of Systems  Constitutional: Negative.  Negative for fatigue and fever.  HENT: Negative.   Respiratory: Negative.  Negative for shortness of breath.   Cardiovascular: Negative.  Negative for  chest pain.  Gastrointestinal: Positive for abdominal pain. Negative for constipation, diarrhea, nausea and vomiting.  Genitourinary: Positive for vaginal bleeding. Negative for dysuria.  Neurological: Negative.  Negative for dizziness and headaches.   Physical Exam   Blood pressure (!) 145/93, pulse 98, temperature 98.8 F (37.1 C), temperature source Oral, resp. rate 16, height 5\' 9"  (1.753 m), weight 104.5 kg, last menstrual period 06/01/2020, SpO2 100 %.  Physical Exam Vitals and nursing note reviewed.  Constitutional:      General: She is not in acute distress.    Appearance: She is well-developed.  HENT:     Head: Normocephalic.  Eyes:     Pupils: Pupils are equal, round, and reactive to light.  Cardiovascular:     Rate and Rhythm: Normal rate and regular rhythm.     Heart sounds: Normal heart sounds.  Pulmonary:     Effort: Pulmonary effort is normal. No respiratory distress.     Breath sounds: Normal breath sounds.  Abdominal:     General: Bowel sounds are normal. There is no distension.  Palpations: Abdomen is soft.     Tenderness: There is no abdominal tenderness.  Genitourinary:    Comments: SSE: scant bright red bleeding in vault Skin:    General: Skin is warm and dry.  Neurological:     Mental Status: She is alert and oriented to person, place, and time.  Psychiatric:        Behavior: Behavior normal.        Thought Content: Thought content normal.        Judgment: Judgment normal.     MAU Course  Procedures Results for orders placed or performed during the hospital encounter of 07/09/20 (from the past 24 hour(s))  Urinalysis, Routine w reflex microscopic Urine, Clean Catch     Status: Abnormal   Collection Time: 07/09/20 10:24 AM  Result Value Ref Range   Color, Urine YELLOW YELLOW   APPearance CLEAR CLEAR   Specific Gravity, Urine 1.021 1.005 - 1.030   pH 5.0 5.0 - 8.0   Glucose, UA NEGATIVE NEGATIVE mg/dL   Hgb urine dipstick LARGE (A) NEGATIVE    Bilirubin Urine NEGATIVE NEGATIVE   Ketones, ur NEGATIVE NEGATIVE mg/dL   Protein, ur 30 (A) NEGATIVE mg/dL   Nitrite NEGATIVE NEGATIVE   Leukocytes,Ua NEGATIVE NEGATIVE   RBC / HPF 11-20 0 - 5 RBC/hpf   WBC, UA 6-10 0 - 5 WBC/hpf   Bacteria, UA NONE SEEN NONE SEEN   Squamous Epithelial / LPF 0-5 0 - 5   Mucus PRESENT   Pregnancy, urine POC     Status: Abnormal   Collection Time: 07/09/20 10:24 AM  Result Value Ref Range   Preg Test, Ur POSITIVE (A) NEGATIVE  Wet prep, genital     Status: Abnormal   Collection Time: 07/09/20 11:02 AM  Result Value Ref Range   Yeast Wet Prep HPF POC NONE SEEN NONE SEEN   Trich, Wet Prep NONE SEEN NONE SEEN   Clue Cells Wet Prep HPF POC NONE SEEN NONE SEEN   WBC, Wet Prep HPF POC MODERATE (A) NONE SEEN   Sperm PRESENT   CBC     Status: Abnormal   Collection Time: 07/09/20 11:08 AM  Result Value Ref Range   WBC 6.8 4.0 - 10.5 K/uL   RBC 4.75 3.87 - 5.11 MIL/uL   Hemoglobin 9.2 (L) 12.0 - 15.0 g/dL   HCT 32.0 (L) 36 - 46 %   MCV 67.4 (L) 80.0 - 100.0 fL   MCH 19.4 (L) 26.0 - 34.0 pg   MCHC 28.8 (L) 30.0 - 36.0 g/dL   RDW 20.8 (H) 11.5 - 15.5 %   Platelets 452 (H) 150 - 400 K/uL   nRBC 0.0 0.0 - 0.2 %  hCG, quantitative, pregnancy     Status: Abnormal   Collection Time: 07/09/20 11:08 AM  Result Value Ref Range   hCG, Beta Chain, Quant, S 48,413 (H) <5 mIU/mL   US OB Comp Less 14 Wks  Result Date: 07/09/2020 CLINICAL DATA:  Vaginal bleeding in a pregnant patient. EXAM: OBSTETRIC <14 WK ULTRASOUND TECHNIQUE: Transabdominal ultrasound was performed for evaluation of the gestation as well as the maternal uterus and adnexal regions. COMPARISON:  None. FINDINGS: Intrauterine gestational sac: Single Yolk sac:  Visualized. Embryo:  Visualized. Cardiac Activity: Visualized. Heart Rate: 123 bpm CRL:   8 mm   6 w 5 d                  [place] EDC: 02/27/2021 Subchorionic hemorrhage:  Small Maternal  uterus/adnexae: Normal appearance of the ovaries. There are 2  anterior fundal fibroids, the larger measuring 1.8 x 1.3 x 1.7 cm. IMPRESSION: Single live intrauterine pregnancy corresponding to 6 weeks and 5 days gestation. Small subchorionic hemorrhage. 2 anterior fundal fibroids, the larger measuring 1.8 cm. Normal appearance of the ovaries. Electronically Signed   By: Fidela Salisbury M.D.   On: 07/09/2020 13:16    MDM UA, UPT CBC, HCG ABO/Rh- O Pos Wet prep and gc/chlamydia US OB Comp Less 14 weeks with Transvaginal   Assessment and Plan   1. Normal intrauterine pregnancy on prenatal ultrasound in first trimester   2. Vaginal bleeding affecting early pregnancy   3. Subchorionic hematoma in first trimester, single or unspecified fetus   4. Uterine leiomyoma, unspecified location   5. [redacted] weeks gestation of pregnancy    -Discharge home in stable condition -Subchorionic hemorrhage bleeding precautions discussed -Patient advised to follow-up with OB to establish prenatal care -Patient may return to MAU as needed or if her condition were to change or worsen   Wende Mott CNM 07/09/2020, 10:53 AM

## 2020-07-09 NOTE — Discharge Instructions (Signed)
Safe Medications in Pregnancy    Acne: Benzoyl Peroxide Salicylic Acid  Backache/Headache: Tylenol: 2 regular strength every 4 hours OR              2 Extra strength every 6 hours  Colds/Coughs/Allergies: Benadryl (alcohol free) 25 mg every 6 hours as needed Breath right strips Claritin Cepacol throat lozenges Chloraseptic throat spray Cold-Eeze- up to three times per day Cough drops, alcohol free Flonase (by prescription only) Guaifenesin Mucinex Robitussin DM (plain only, alcohol free) Saline nasal spray/drops Sudafed (pseudoephedrine) & Actifed ** use only after [redacted] weeks gestation and if you do not have high blood pressure Tylenol Vicks Vaporub Zinc lozenges Zyrtec   Constipation: Colace Ducolax suppositories Fleet enema Glycerin suppositories Metamucil Milk of magnesia Miralax Senokot Smooth move tea  Diarrhea: Kaopectate Imodium A-D  *NO pepto Bismol  Hemorrhoids: Anusol Anusol HC Preparation H Tucks  Indigestion: Tums Maalox Mylanta Zantac  Pepcid  Insomnia: Benadryl (alcohol free) 25mg every 6 hours as needed Tylenol PM Unisom, no Gelcaps  Leg Cramps: Tums MagGel  Nausea/Vomiting:  Bonine Dramamine Emetrol Ginger extract Sea bands Meclizine  Nausea medication to take during pregnancy:  Unisom (doxylamine succinate 25 mg tablets) Take one tablet daily at bedtime. If symptoms are not adequately controlled, the dose can be increased to a maximum recommended dose of two tablets daily (1/2 tablet in the morning, 1/2 tablet mid-afternoon and one at bedtime). Vitamin B6 100mg tablets. Take one tablet twice a day (up to 200 mg per day).  Skin Rashes: Aveeno products Benadryl cream or 25mg every 6 hours as needed Calamine Lotion 1% cortisone cream  Yeast infection: Gyne-lotrimin 7 Monistat 7   **If taking multiple medications, please check labels to avoid duplicating the same active ingredients **take  medication as directed on the label ** Do not exceed 4000 mg of tylenol in 24 hours **Do not take medications that contain aspirin or ibuprofen   Prenatal Care Providers           [facility]  [address] [phone]  [facility]   [address]  [phone]  [facility] @ [place]       [address] [phone]            [facility] @ [place]     [address] [phone]          [facility] @ [place]   [address] [phone]  [facility]  [address] [phone]     [facility]  [address]   [phone]     [facility]  Phone: [phone]  [facility]  Phone: [phone]  [facility] Phone: [phone]  [facility] Phone: [phone]  [facility] Phone: [phone]  [facility] Phone: [phone]  [facility]  Phone: [phone]  

## 2020-07-11 LAB — GC/CHLAMYDIA PROBE AMP (~~LOC~~) NOT AT ARMC
Chlamydia: POSITIVE — AB
Comment: NEGATIVE
Comment: NORMAL
Neisseria Gonorrhea: NEGATIVE

## 2020-07-13 ENCOUNTER — Telehealth: Payer: Self-pay | Admitting: Student

## 2020-07-13 ENCOUNTER — Other Ambulatory Visit: Payer: Self-pay

## 2020-07-13 DIAGNOSIS — A749 Chlamydial infection, unspecified: Secondary | ICD-10-CM

## 2020-07-13 MED ORDER — AZITHROMYCIN 500 MG PO TABS: 1000.0000 mg | ORAL_TABLET | Freq: Once | ORAL | 0 refills | Status: AC

## 2020-07-13 NOTE — Telephone Encounter (Addendum)
-----   Message from Bjorn Loser, RN sent at 07/13/2020 10:28 AM EDT ----- This patient tested positive for:  Chlamydia  She "has NKDA",I have informed the patient of her results and confirmed her pharmacy is correct in her chart. Please send Rx.   Thank you,   Bjorn Loser, RN   Results faxed to [facility].       Meredith Chen tested positive for  Chlamydia. Patient was called by RN and allergies and pharmacy confirmed. Rx sent to pharmacy of choice.   Jorje Guild, NP 07/13/2020 11:02 AM

## 2020-07-15 DIAGNOSIS — U071 COVID-19: Secondary | ICD-10-CM

## 2020-07-15 DIAGNOSIS — J1282 Pneumonia due to coronavirus disease 2019: Secondary | ICD-10-CM

## 2020-07-15 HISTORY — DX: COVID-19: U07.1

## 2020-07-15 HISTORY — DX: Pneumonia due to coronavirus disease 2019: J12.82

## 2020-08-03 ENCOUNTER — Ambulatory Visit: Payer: Self-pay | Admitting: *Deleted

## 2020-08-03 NOTE — Telephone Encounter (Signed)
C/o covid symptoms and tested positive with rapid test and tested at [facility] this am. Patient is [redacted] weeks pregnant and requesting what she can do to treat symptoms. Patient does not have PCP or OBGYN at this time. Patient is awaiting Medicaid to go through. C/o headache, body aches, fever, and dry cough at this time. Symptoms started yesterday per patient and fever was at 103. She took tylenol and this am fever at 100. Monday noted legs hurting and Tuesday became more symptomatic. Will refer patient info to [facility]- hotline. Care advise given. Patient verbalized understanding of care advise and to call back or go to ED if symptoms worsen.   Reason for Disposition . HIGH RISK for severe COVID complications (e.g., age > 59 years, obesity with BMI > 25, pregnant, chronic lung disease or other chronic medical condition)  (Exception: Already seen by PCP and no new or worsening symptoms.)  Answer Assessment - Initial Assessment Questions 1. COVID-19 DIAGNOSIS: "Who made your Coronavirus (COVID-19) diagnosis?" "Was it confirmed by a positive lab test?" If not diagnosed by a HCP, ask "Are there lots of cases (community spread) where you live?" (See public health department website, if unsure)     Rapid test and was tested this am from [facility] 2. COVID-19 EXPOSURE: "Was there any known exposure to COVID before the symptoms began?" CDC Definition of close contact: within 6 feet (2 meters) for a total of 15 minutes or more over a 24-hour period.      na 3. ONSET: "When did the COVID-19 symptoms start?"      Yesterday  4. WORST SYMPTOM: "What is your worst symptom?" (e.g., cough, fever, shortness of breath, muscle aches)     bodyaches  5. COUGH: "Do you have a cough?" If Yes, ask: "How bad is the cough?"       Yes dry cough not bad 6. FEVER: "Do you have a fever?" If Yes, ask: "What is your temperature, how was it measured, and when did it start?"     100 this am but was 103 yesterday.  7. RESPIRATORY STATUS:  "Describe your breathing?" (e.g., shortness of breath, wheezing, unable to speak)      No  8. BETTER-SAME-WORSE: "Are you getting better, staying the same or getting worse compared to yesterday?"  If getting worse, ask, "In what way?"     same 9. HIGH RISK DISEASE: "Do you have any chronic medical problems?" (e.g., asthma, heart or lung disease, weak immune system, obesity, etc.)     No  10. PREGNANCY: "Is there any chance you are pregnant?" "When was your last menstrual period?"       Yes 9 weeks  11. OTHER SYMPTOMS: "Do you have any other symptoms?"  (e.g., chills, fatigue, headache, loss of smell or taste, muscle pain, sore throat; new loss of smell or taste especially support the diagnosis of COVID-19)       Headache, body aches, fever, dry cough  Protocols used: CORONAVIRUS (COVID-19) DIAGNOSED OR SUSPECTED-A-AH

## 2020-08-04 ENCOUNTER — Telehealth: Payer: Self-pay | Admitting: Adult Health

## 2020-08-04 NOTE — Telephone Encounter (Signed)
Called patient regarding monoclonal antibody treatment for COVID 19 given to those who are at risk for complications and/or hospitalization of the virus.  Patient meets criteria based on: pregnancy.  She would like to think about treatment and will call us back if interested.  Call back number given: [phone]  Wilber Bihari, NP

## 2020-08-09 ENCOUNTER — Emergency Department (HOSPITAL_COMMUNITY): Payer: HRSA Program

## 2020-08-09 ENCOUNTER — Other Ambulatory Visit: Payer: Self-pay

## 2020-08-09 ENCOUNTER — Encounter (HOSPITAL_COMMUNITY): Payer: Self-pay | Admitting: Emergency Medicine

## 2020-08-09 ENCOUNTER — Inpatient Hospital Stay (HOSPITAL_COMMUNITY)
Admission: EM | Admit: 2020-08-09 | Discharge: 2020-08-11 | DRG: 831 | Disposition: A | Discharge status: Home / Self Care | Payer: HRSA Program | Attending: Family Medicine | Admitting: Family Medicine

## 2020-08-09 DIAGNOSIS — U071 COVID-19: Secondary | ICD-10-CM | POA: Diagnosis not present

## 2020-08-09 DIAGNOSIS — O169 Unspecified maternal hypertension, unspecified trimester: Secondary | ICD-10-CM | POA: Diagnosis present

## 2020-08-09 DIAGNOSIS — O98511 Other viral diseases complicating pregnancy, first trimester: Principal | ICD-10-CM | POA: Diagnosis present

## 2020-08-09 DIAGNOSIS — Z833 Family history of diabetes mellitus: Secondary | ICD-10-CM

## 2020-08-09 DIAGNOSIS — E860 Dehydration: Secondary | ICD-10-CM | POA: Diagnosis present

## 2020-08-09 DIAGNOSIS — E871 Hypo-osmolality and hyponatremia: Secondary | ICD-10-CM | POA: Diagnosis present

## 2020-08-09 DIAGNOSIS — D649 Anemia, unspecified: Secondary | ICD-10-CM | POA: Diagnosis present

## 2020-08-09 DIAGNOSIS — O99511 Diseases of the respiratory system complicating pregnancy, first trimester: Secondary | ICD-10-CM | POA: Diagnosis present

## 2020-08-09 DIAGNOSIS — O99011 Anemia complicating pregnancy, first trimester: Secondary | ICD-10-CM | POA: Diagnosis present

## 2020-08-09 DIAGNOSIS — O99280 Endocrine, nutritional and metabolic diseases complicating pregnancy, unspecified trimester: Secondary | ICD-10-CM | POA: Diagnosis present

## 2020-08-09 DIAGNOSIS — Z841 Family history of disorders of kidney and ureter: Secondary | ICD-10-CM

## 2020-08-09 DIAGNOSIS — Z3A10 10 weeks gestation of pregnancy: Secondary | ICD-10-CM

## 2020-08-09 DIAGNOSIS — O99281 Endocrine, nutritional and metabolic diseases complicating pregnancy, first trimester: Secondary | ICD-10-CM | POA: Diagnosis present

## 2020-08-09 DIAGNOSIS — R030 Elevated blood-pressure reading, without diagnosis of hypertension: Secondary | ICD-10-CM | POA: Diagnosis present

## 2020-08-09 DIAGNOSIS — O219 Vomiting of pregnancy, unspecified: Secondary | ICD-10-CM | POA: Diagnosis present

## 2020-08-09 DIAGNOSIS — O99211 Obesity complicating pregnancy, first trimester: Secondary | ICD-10-CM | POA: Diagnosis present

## 2020-08-09 DIAGNOSIS — J1282 Pneumonia due to coronavirus disease 2019: Secondary | ICD-10-CM | POA: Diagnosis present

## 2020-08-09 DIAGNOSIS — O26891 Other specified pregnancy related conditions, first trimester: Secondary | ICD-10-CM | POA: Diagnosis present

## 2020-08-09 LAB — CBC WITH DIFFERENTIAL/PLATELET
Abs Immature Granulocytes: 0.01 10*3/uL (ref 0.00–0.07)
Basophils Absolute: 0 10*3/uL (ref 0.0–0.1)
Basophils Relative: 0 %
Eosinophils Absolute: 0 10*3/uL (ref 0.0–0.5)
Eosinophils Relative: 0 %
HCT: 33.2 % — ABNORMAL LOW (ref 36.0–46.0)
Hemoglobin: 10 g/dL — ABNORMAL LOW (ref 12.0–15.0)
Immature Granulocytes: 0 %
Lymphocytes Relative: 27 %
Lymphs Abs: 0.9 10*3/uL (ref 0.7–4.0)
MCH: 20.8 pg — ABNORMAL LOW (ref 26.0–34.0)
MCHC: 30.1 g/dL (ref 30.0–36.0)
MCV: 69 fL — ABNORMAL LOW (ref 80.0–100.0)
Monocytes Absolute: 0.3 10*3/uL (ref 0.1–1.0)
Monocytes Relative: 9 %
Neutro Abs: 2.2 10*3/uL (ref 1.7–7.7)
Neutrophils Relative %: 64 %
Platelets: 322 10*3/uL (ref 150–400)
RBC: 4.81 MIL/uL (ref 3.87–5.11)
RDW: 22 % — ABNORMAL HIGH (ref 11.5–15.5)
WBC: 3.4 10*3/uL — ABNORMAL LOW (ref 4.0–10.5)
nRBC: 0 % (ref 0.0–0.2)

## 2020-08-09 LAB — COMPREHENSIVE METABOLIC PANEL
ALT: 33 U/L (ref 0–44)
AST: 90 U/L — ABNORMAL HIGH (ref 15–41)
Albumin: 3.3 g/dL — ABNORMAL LOW (ref 3.5–5.0)
Alkaline Phosphatase: 73 U/L (ref 38–126)
Anion gap: 10 (ref 5–15)
BUN: 8 mg/dL (ref 6–20)
CO2: 21 mmol/L — ABNORMAL LOW (ref 22–32)
Calcium: 8.3 mg/dL — ABNORMAL LOW (ref 8.9–10.3)
Chloride: 98 mmol/L (ref 98–111)
Creatinine, Ser: 0.83 mg/dL (ref 0.44–1.00)
GFR, Estimated: >60 mL/min (ref >60)
Glucose, Bld: 97 mg/dL (ref 70–99)
Potassium: 4.3 mmol/L (ref 3.5–5.1)
Sodium: 129 mmol/L — ABNORMAL LOW (ref 135–145)
Total Bilirubin: 0.7 mg/dL (ref 0.3–1.2)
Total Protein: 8.5 g/dL — ABNORMAL HIGH (ref 6.5–8.1)

## 2020-08-09 LAB — TRIGLYCERIDES: Triglycerides: 107 mg/dL (ref <150)

## 2020-08-09 LAB — LACTIC ACID, PLASMA: Lactic Acid, Venous: 1 mmol/L (ref 0.5–1.9)

## 2020-08-09 LAB — I-STAT BETA HCG BLOOD, ED (MC, WL, AP ONLY): I-stat hCG, quantitative: >2000 m[IU]/mL — ABNORMAL HIGH (ref <5)

## 2020-08-09 LAB — C-REACTIVE PROTEIN: CRP: 1.9 mg/dL — ABNORMAL HIGH (ref <1.0)

## 2020-08-09 LAB — FERRITIN: Ferritin: 26 ng/mL (ref 11–307)

## 2020-08-09 LAB — LACTATE DEHYDROGENASE: LDH: 658 U/L — ABNORMAL HIGH (ref 98–192)

## 2020-08-09 LAB — RESPIRATORY PANEL BY RT PCR (FLU A&B, COVID)
Influenza A by PCR: NEGATIVE
Influenza B by PCR: NEGATIVE
SARS Coronavirus 2 by RT PCR: POSITIVE — AB

## 2020-08-09 LAB — FIBRINOGEN: Fibrinogen: 433 mg/dL (ref 210–475)

## 2020-08-09 LAB — D-DIMER, QUANTITATIVE: D-Dimer, Quant: 0.99 ug/mL-FEU — ABNORMAL HIGH (ref 0.00–0.50)

## 2020-08-09 MED ORDER — SODIUM CHLORIDE 0.9 % IV SOLN
1000.0000 mL | INTRAVENOUS | Status: DC
  Administered 2020-08-09 – 2020-08-10 (×2): 1000 mL via INTRAVENOUS

## 2020-08-09 MED ORDER — SODIUM CHLORIDE 0.9 % IV BOLUS
1000.0000 mL | Freq: Once | INTRAVENOUS | Status: AC
  Administered 2020-08-09: 1000 mL via INTRAVENOUS

## 2020-08-09 NOTE — ED Triage Notes (Signed)
39 yo female BIBA co of intermittent SOB, fever, body aches, n/v/d. Pt took an at home covid test which was postive on 08/02/20. Pt then did a repeat test at [facility] 08/04/20 which was a confirmed positive. Pt is [redacted] weeks pregnant and not receiving prenatal care at this time. G5P4. Pt has not had her initial appointment yet due to her covid positive status.   Vitals: bp 130/90 Hr 128 rr 20 cbg 115 spo2 97-99 on room air

## 2020-08-09 NOTE — ED Provider Notes (Signed)
[facility] DEPT Provider Note   CSN: [id number] Arrival date & time: 08/09/20  2118     History No chief complaint on file.   Meredith Chen is a 39 y.o. female.  HPI 39 year old female G5, P5 [redacted] weeks pregnant presents today with cough and dyspnea with Covid diagnosis for 1 week.   She states she had positive Covid test done at home on Tuesday and from an urgent care on Thursday.  She initially had chills followed by body aches and fever.  She began having worsening cough yesterday.  Today she is short of breath with her coughing episodes with some dyspnea at rest.  She has also had some associated vomiting today.  She denies any abdominal pain or diarrhea.    Past Medical History:  Diagnosis Date  . Abnormal Pap smear   . Chlamydia   . Medical history non-contributory   . Trichomonas    January 2014    Patient Active Problem List   Diagnosis Date Noted  . Pyuria 04/27/2013  . Cough 04/13/2013  . H/O cesarean section complicating pregnancy 20/25/4270  . Pregnancy with other poor obstetric history(V23.49) 03/16/2013  . Elevated liver function tests in pregnancy 02/03/2013  . Di/Di twins, antepartum 02/02/2013  . Elevated blood pressure complicating pregnancy, antepartum 02/02/2013  . Rubella non-immune status, antepartum 01/01/2013  . Anemia 01/01/2013    Past Surgical History:  Procedure Laterality Date  . CESAREAN SECTION       OB History    Gravida  6   Para  4   Term  4   Preterm      AB  1   Living  5     SAB      TAB  1   Ectopic      Multiple  1   Live Births  5           Family History  Problem Relation Age of Onset  . Kidney disease Mother   . Heart disease Son   . Diabetes Maternal Aunt     Social History   Tobacco Use  . Smoking status: Never Smoker  . Smokeless tobacco: Never Used  Substance Use Topics  . Alcohol use: Not Currently    Comment: social  . Drug use: No    Home  Medications Prior to Admission medications   Medication Sig Start Date End Date Taking? Authorizing Provider  artificial tears (LACRILUBE) OINT ophthalmic ointment Place into the left eye every 4 (four) hours as needed for dry eyes. Patient not taking: Reported on 08/09/2020 02/18/18   Lorayne Bender, PA-C    Allergies    Patient has no known allergies.  Review of Systems   Review of Systems  Constitutional: Positive for activity change, appetite change, chills, fatigue and fever.  HENT: Positive for congestion.   Eyes: Negative.   Respiratory: Positive for cough and shortness of breath.   Cardiovascular: Negative.   Gastrointestinal: Positive for nausea and vomiting. Negative for diarrhea.  Endocrine: Negative.   Genitourinary: Negative.   Musculoskeletal: Negative.   Skin: Negative.   Allergic/Immunologic: Negative.   Neurological: Negative.   Hematological: Negative.   Psychiatric/Behavioral: Negative.   All other systems reviewed and are negative.   Physical Exam Updated Vital Signs BP (!) 141/79   Pulse (!) 115   Resp (!) 27   LMP 06/01/2020   SpO2 96%   Physical Exam  ED Results / Procedures / Treatments  Labs (all labs ordered are listed, but only abnormal results are displayed) Labs Reviewed  CBC WITH DIFFERENTIAL/PLATELET - Abnormal; Notable for the following components:      Result Value   WBC 3.4 (*)    Hemoglobin 10.0 (*)    HCT 33.2 (*)    MCV 69.0 (*)    MCH 20.8 (*)    RDW 22.0 (*)    All other components within normal limits  COMPREHENSIVE METABOLIC PANEL - Abnormal; Notable for the following components:   Sodium 129 (*)    CO2 21 (*)    Calcium 8.3 (*)    Total Protein 8.5 (*)    Albumin 3.3 (*)    AST 90 (*)    All other components within normal limits  LACTATE DEHYDROGENASE - Abnormal; Notable for the following components:   LDH 658 (*)    All other components within normal limits  C-REACTIVE PROTEIN - Abnormal; Notable for the following  components:   CRP 1.9 (*)    All other components within normal limits  I-STAT BETA HCG BLOOD, ED ([facility], [facility], [facility] ONLY) - Abnormal; Notable for the following components:   I-stat hCG, quantitative >2,000.0 (*)    All other components within normal limits  RESPIRATORY PANEL BY RT PCR (FLU A&B, COVID)  CULTURE, BLOOD (ROUTINE X 2)  CULTURE, BLOOD (ROUTINE X 2)  LACTIC ACID, PLASMA  FERRITIN  TRIGLYCERIDES  LACTIC ACID, PLASMA  D-DIMER, QUANTITATIVE (NOT AT [facility])  PROCALCITONIN  FIBRINOGEN    EKG None  Radiology DG Chest Port 1 View  Result Date: 08/09/2020 CLINICAL DATA:  Cough COVID EXAM: PORTABLE CHEST 1 VIEW COMPARISON:  None. FINDINGS: Possible mild ground-glass opacity in the left base and periphery of left lung. Normal heart size. No pneumothorax. IMPRESSION: Possible mild ground-glass opacity/pneumonia in the left lung base and periphery of the left lung. Electronically Signed   By: Donavan Foil M.D.   On: 08/09/2020 22:32    Procedures Procedures (including critical care time)  Medications Ordered in ED Medications  0.9 %  sodium chloride infusion (1,000 mLs Intravenous New Bag/Given (Non-Interop) 08/09/20 2240)  sodium chloride 0.9 % bolus 1,000 mL (1,000 mLs Intravenous New Bag/Given (Non-Interop) 08/09/20 2240)    ED Course  I have reviewed the triage vital signs and the nursing notes.  Pertinent labs & imaging results that were available during my care of the patient were reviewed by me and considered in my medical decision making (see chart for details).    MDM Rules/Calculators/A&P                          39 yo female [redacted] weeks pregnant presents with covid, cough dyspnea, nausea, vomiting decreased po intake.  Patient is tachycardiac and tachypneic.  Plan iv fluids, oxygen, and admission Discussed with Dr. Crissie Sickles and will see for admission  Final Clinical Impression(s) / ED Diagnoses Final diagnoses:  COVID-19    Rx / DC Orders ED Discharge Orders     None       Pattricia Boss, MD 08/09/20 2344

## 2020-08-10 ENCOUNTER — Encounter (HOSPITAL_COMMUNITY): Payer: Self-pay | Admitting: Internal Medicine

## 2020-08-10 DIAGNOSIS — D649 Anemia, unspecified: Secondary | ICD-10-CM

## 2020-08-10 DIAGNOSIS — O99511 Diseases of the respiratory system complicating pregnancy, first trimester: Secondary | ICD-10-CM | POA: Diagnosis present

## 2020-08-10 DIAGNOSIS — O26891 Other specified pregnancy related conditions, first trimester: Secondary | ICD-10-CM | POA: Diagnosis present

## 2020-08-10 DIAGNOSIS — Z841 Family history of disorders of kidney and ureter: Secondary | ICD-10-CM | POA: Diagnosis not present

## 2020-08-10 DIAGNOSIS — O98511 Other viral diseases complicating pregnancy, first trimester: Secondary | ICD-10-CM | POA: Diagnosis present

## 2020-08-10 DIAGNOSIS — O169 Unspecified maternal hypertension, unspecified trimester: Secondary | ICD-10-CM | POA: Diagnosis not present

## 2020-08-10 DIAGNOSIS — O99280 Endocrine, nutritional and metabolic diseases complicating pregnancy, unspecified trimester: Secondary | ICD-10-CM | POA: Diagnosis present

## 2020-08-10 DIAGNOSIS — U071 COVID-19: Secondary | ICD-10-CM | POA: Diagnosis present

## 2020-08-10 DIAGNOSIS — Z3A10 10 weeks gestation of pregnancy: Secondary | ICD-10-CM | POA: Diagnosis not present

## 2020-08-10 DIAGNOSIS — E860 Dehydration: Secondary | ICD-10-CM

## 2020-08-10 DIAGNOSIS — J1282 Pneumonia due to coronavirus disease 2019: Secondary | ICD-10-CM

## 2020-08-10 DIAGNOSIS — E871 Hypo-osmolality and hyponatremia: Secondary | ICD-10-CM | POA: Diagnosis present

## 2020-08-10 DIAGNOSIS — O99011 Anemia complicating pregnancy, first trimester: Secondary | ICD-10-CM | POA: Diagnosis present

## 2020-08-10 DIAGNOSIS — O99281 Endocrine, nutritional and metabolic diseases complicating pregnancy, first trimester: Secondary | ICD-10-CM | POA: Diagnosis present

## 2020-08-10 DIAGNOSIS — O99211 Obesity complicating pregnancy, first trimester: Secondary | ICD-10-CM | POA: Diagnosis present

## 2020-08-10 DIAGNOSIS — R030 Elevated blood-pressure reading, without diagnosis of hypertension: Secondary | ICD-10-CM | POA: Diagnosis present

## 2020-08-10 DIAGNOSIS — O219 Vomiting of pregnancy, unspecified: Secondary | ICD-10-CM | POA: Diagnosis present

## 2020-08-10 DIAGNOSIS — Z833 Family history of diabetes mellitus: Secondary | ICD-10-CM | POA: Diagnosis not present

## 2020-08-10 LAB — PROCALCITONIN: Procalcitonin: <0.1 ng/mL

## 2020-08-10 LAB — HIV ANTIBODY (ROUTINE TESTING W REFLEX): HIV Screen 4th Generation wRfx: NONREACTIVE

## 2020-08-10 MED ORDER — ACETAMINOPHEN 325 MG PO TABS
650.0000 mg | ORAL_TABLET | Freq: Four times a day (QID) | ORAL | Status: DC | PRN
  Administered 2020-08-10 (×2): 650 mg via ORAL
  Filled 2020-08-10 (×2): qty 2

## 2020-08-10 MED ORDER — PRENATAL MULTIVITAMIN CH
1.0000 | ORAL_TABLET | Freq: Every day | ORAL | Status: DC
  Administered 2020-08-11: 1 via ORAL
  Filled 2020-08-10: qty 1

## 2020-08-10 MED ORDER — SODIUM CHLORIDE 0.9 % IV SOLN
200.0000 mg | Freq: Once | INTRAVENOUS | Status: AC
  Administered 2020-08-10: 200 mg via INTRAVENOUS
  Filled 2020-08-10: qty 200

## 2020-08-10 MED ORDER — SALINE SPRAY 0.65 % NA SOLN
1.0000 | NASAL | Status: DC | PRN
  Filled 2020-08-10: qty 44

## 2020-08-10 MED ORDER — GUAIFENESIN-DM 100-10 MG/5ML PO SYRP
10.0000 mL | ORAL_SOLUTION | ORAL | Status: DC | PRN
  Administered 2020-08-10 – 2020-08-11 (×4): 10 mL via ORAL
  Filled 2020-08-10 (×4): qty 10

## 2020-08-10 MED ORDER — DEXAMETHASONE 4 MG PO TABS
6.0000 mg | ORAL_TABLET | Freq: Every day | ORAL | Status: DC
  Administered 2020-08-10 (×2): 6 mg via ORAL
  Filled 2020-08-10 (×2): qty 2

## 2020-08-10 MED ORDER — DEXTROSE-NACL 5-0.45 % IV SOLN: INTRAVENOUS | Status: DC

## 2020-08-10 MED ORDER — SENNOSIDES-DOCUSATE SODIUM 8.6-50 MG PO TABS
1.0000 | ORAL_TABLET | Freq: Every day | ORAL | Status: DC
  Filled 2020-08-10: qty 1

## 2020-08-10 MED ORDER — ENOXAPARIN SODIUM 40 MG/0.4ML ~~LOC~~ SOLN
40.0000 mg | SUBCUTANEOUS | Status: DC
  Administered 2020-08-10 – 2020-08-11 (×2): 40 mg via SUBCUTANEOUS
  Filled 2020-08-10 (×2): qty 0.4

## 2020-08-10 MED ORDER — LIP MEDEX EX OINT
TOPICAL_OINTMENT | CUTANEOUS | Status: DC | PRN
  Filled 2020-08-10: qty 7

## 2020-08-10 MED ORDER — ALPRAZOLAM 0.5 MG PO TABS: 0.5000 mg | ORAL_TABLET | Freq: Three times a day (TID) | ORAL | Status: DC | PRN

## 2020-08-10 MED ORDER — SODIUM CHLORIDE 0.9 % IV SOLN
100.0000 mg | Freq: Every day | INTRAVENOUS | Status: DC
  Administered 2020-08-11: 100 mg via INTRAVENOUS
  Filled 2020-08-10: qty 20

## 2020-08-10 NOTE — H&P (Signed)
History and Physical    Meredith Chen VWU:[id number] DOB: 1981-09-30 DOA: 08/09/2020  PCP: Patient, No Pcp Per (Confirm with patient/family/NH records and if not entered, this has to be entered at [facility] point of entry) Patient coming from: home  I have personally briefly reviewed patient's old medical records in [place]  Chief Complaint: SOB, Cough, myalgias  HPI: Meredith Chen is a 39 y.o. female with medical history significant of G5, P5 [redacted] weeks pregnant presents today with cough and dyspnea with Covid diagnosis for 1 week.   She states she had positive Covid test done at home on Tuesday and from an urgent care on Thursday.  She initially had chills followed by body aches and fever.  She began having worsening cough yesterday.  Today she is short of breath with her coughing episodes with some dyspnea at rest and worse dyspnea with exertion.  She has also had some associated vomiting today.  She denies any abdominal pain or diarrhea. She has not been vaccinated  ED Course: no temp recorded. 130/84  HR 108  RR 30 O2 sat RA 96%. Lab reveals Covid +,  Na 129  Cr 0;83, WBC nl, Hgb 10. LDH 658, ferritin 56, CRP 1.9, lactic acid 1.0, D-deimer 0;99, fibrinogen 433. Procalcitonin <10. CXr with ground glass opacity left lunb base and periphery c/w viral pna.  Review of Systems: As per HPI otherwise 10 point review of systems negative.    Past Medical History:  Diagnosis Date  . Abnormal Pap smear   . Chlamydia   . Medical history non-contributory   . Trichomonas    January 2014    Past Surgical History:  Procedure Laterality Date  . CESAREAN SECTION      social Hx - lives with boyfriend. She has a 4 y/o, 13 y/o and twins age 56. Family is in town.    reports that she has never smoked. She has never used smokeless tobacco. She reports previous alcohol use. She reports that she does not use drugs.  No Known Allergies  Family History  Problem Relation Age of Onset  . Kidney  disease Mother   . Heart disease Son   . Diabetes Maternal Aunt      Prior to Admission medications   Medication Sig Start Date End Date Taking? Authorizing Provider  artificial tears (LACRILUBE) OINT ophthalmic ointment Place into the left eye every 4 (four) hours as needed for dry eyes. Patient not taking: Reported on 08/09/2020 02/18/18   Lorayne Bender, PA-C    Physical Exam: Vitals:   08/09/20 2215 08/10/20 0000 08/10/20 0045 08/10/20 0115  BP: (!) 141/79 130/84 (!) 152/99 (!) 148/103  Pulse: (!) 115 (!) 108 (!) 120 (!) 119  Resp: (!) 27 (!) 30 (!) 28 (!) 28  SpO2: 96% 96% 97% 96%     Vitals:   08/09/20 2215 08/10/20 0000 08/10/20 0045 08/10/20 0115  BP: (!) 141/79 130/84 (!) 152/99 (!) 148/103  Pulse: (!) 115 (!) 108 (!) 120 (!) 119  Resp: (!) 27 (!) 30 (!) 28 (!) 28  SpO2: 96% 96% 97% 96%   General: obese woman who appears uncomfortable and dyspneic. She is tearful. Eyes: PERRL, lids and conjunctivae normal ENMT: Mucous membranes are moist. Posterior pharynx clear of any exudate or lesions.Normal dentition.  Neck: normal, supple, no masses, no thyromegaly Respiratory: Increased respiratory effort, tachypnic, no rales or wheezing.  Cardiovascular: Regular rate and rhythm, no murmurs / rubs / gallops. No extremity  edema. 2+ pedal pulses. No carotid bruits.  Abdomen: obese,   Tenderness to palpation left flank, no masses palpated. No hepatosplenomegaly. Bowel sounds positive.  Musculoskeletal: no clubbing / cyanosis. No joint deformity upper and lower extremities. Good ROM, no contractures. Normal muscle tone.  Skin: no rashes, lesions, ulcers. No induration Neurologic: CN 2-12 grossly intact. Strength 5/5 in all 4.  Psychiatric: Normal judgment and insight. Alert and oriented x 3. Normal mood.     Labs on Admission: I have personally reviewed following labs and imaging studies  CBC: Recent Labs  Lab 08/09/20 2212  WBC 3.4*  NEUTROABS 2.2  HGB 10.0*  HCT 33.2*   MCV 69.0*  PLT 124   Basic Metabolic Panel: Recent Labs  Lab 08/09/20 2212  NA 129*  K 4.3  CL 98  CO2 21*  GLUCOSE 97  BUN 8  CREATININE 0.83  CALCIUM 8.3*   GFR: CrCl cannot be calculated (Unknown ideal weight.). Liver Function Tests: Recent Labs  Lab 08/09/20 2212  AST 90*  ALT 33  ALKPHOS 73  BILITOT 0.7  PROT 8.5*  ALBUMIN 3.3*   No results for input(s): LIPASE, AMYLASE in the last 168 hours. No results for input(s): AMMONIA in the last 168 hours. Coagulation Profile: No results for input(s): INR, PROTIME in the last 168 hours. Cardiac Enzymes: No results for input(s): CKTOTAL, CKMB, CKMBINDEX, TROPONINI in the last 168 hours. BNP (last 3 results) No results for input(s): PROBNP in the last 8760 hours. HbA1C: No results for input(s): HGBA1C in the last 72 hours. CBG: No results for input(s): GLUCAP in the last 168 hours. Lipid Profile: Recent Labs    08/09/20 2212  TRIG 107   Thyroid Function Tests: No results for input(s): TSH, T4TOTAL, FREET4, T3FREE, THYROIDAB in the last 72 hours. Anemia Panel: Recent Labs    08/09/20 2212  FERRITIN 26   Urine analysis:    Component Value Date/Time   COLORURINE YELLOW 07/09/2020 1024   APPEARANCEUR CLEAR 07/09/2020 1024   LABSPEC 1.021 07/09/2020 1024   PHURINE 5.0 07/09/2020 1024   GLUCOSEU NEGATIVE 07/09/2020 1024   HGBUR LARGE (A) 07/09/2020 1024   BILIRUBINUR NEGATIVE 07/09/2020 1024   KETONESUR NEGATIVE 07/09/2020 1024   PROTEINUR 30 (A) 07/09/2020 1024   UROBILINOGEN 1.0 06/08/2013 1006   NITRITE NEGATIVE 07/09/2020 1024   LEUKOCYTESUR NEGATIVE 07/09/2020 1024    Radiological Exams on Admission: DG Chest Port 1 View  Result Date: 08/09/2020 CLINICAL DATA:  Cough COVID EXAM: PORTABLE CHEST 1 VIEW COMPARISON:  None. FINDINGS: Possible mild ground-glass opacity in the left base and periphery of left lung. Normal heart size. No pneumothorax. IMPRESSION: Possible mild ground-glass  opacity/pneumonia in the left lung base and periphery of the left lung. Electronically Signed   By: Donavan Foil M.D.   On: 08/09/2020 22:32    EKG: Independently reviewed. Sinus tachycardia, no acute changes  Assessment/Plan Active Problems:   Dehydration during pregnancy   Pneumonia due to COVID-19 virus   Hyponatremia   Anemia   Elevated blood pressure complicating pregnancy, antepartum  (please populate well all problems here in Problem List. (For example, if patient is on BP meds at home and you resume or decide to hold them, it is a problem that needs to be her. Same for CAD, COPD, HLD and so on)   1. Covid pneumonia - positive covid test, elevated inflammatory markers, abnl CXR and subjectively SOB with rapid respirations. Plan Med-surg admit  Decadron 6 mg daily po  Pharmacy consult re: use of remdesivir in pregnancy. Based on symptoms, lab and x-ray she is a candidate. With active PNA not a candidate for monoclonal ab.  Oxygen for comfort  Close monitoring of O2 sats  Consult OB re: covid in pregnancy - Dr. Ronnette Hila is her OB  She is instructed that family needs testing, should consider vaccine and needs self isolation  2. Hypnatremia and dehydration - Na 129 Plan NS at 75 cc/hr  F/u Bmet  3. Anemia - record indicates h/o moderate anemia with pregnancy. Will monitor  DVT prophylaxis: Lovenox  Code Status: full code (Full/Partial (specify details) Family Communication: patient prefer to communicate herself to her family  Disposition Plan: home when medically stable  Consults called: none except pharmacy (with names) Admission status: inpatient    Adella Hare MD [facility] Pager [phone]  If 7PM-7AM, please contact night-coverage [email or web address] Password [email or web address]  08/10/2020, 1:20 AM

## 2020-08-10 NOTE — Progress Notes (Signed)
   08/10/20 0404  Assess: MEWS Score  Temp (!) 101.3 F (38.5 C) (NURSE NOTIFIED)  BP 126/76  Pulse Rate (!) 105  Resp 20  SpO2 99 %  Assess: MEWS Score  MEWS Temp 1  MEWS Systolic 0  MEWS Pulse 1  MEWS RR 0  MEWS LOC 0  MEWS Score 2  MEWS Score Color Yellow  Assess: if the MEWS score is Yellow or Red  Were vital signs taken at a resting state? Yes  Focused Assessment No change from prior assessment  Early Detection of Sepsis Score *See Row Information* Medium  MEWS guidelines implemented *See Row Information* Yes  Treat  MEWS Interventions Administered scheduled meds/treatments  Pain Scale 0-10  Pain Score 0  Take Vital Signs  Increase Vital Sign Frequency  Yellow: Q 2hr X 2 then Q 4hr X 2, if remains yellow, continue Q 4hrs  Document  Progress note created (see row info) Yes   Covers removed. Tylenol given as ordered. Will recheck vitals.

## 2020-08-10 NOTE — Progress Notes (Signed)
   08/10/20 0156  Assess: MEWS Score  Temp 99.7 F (37.6 C)  BP (!) 151/86  Pulse Rate (!) 109  Resp (!) 21  SpO2 98 %  O2 Device Room Air  Assess: MEWS Score  MEWS Temp 0  MEWS Systolic 0  MEWS Pulse 1  MEWS RR 1  MEWS LOC 0  MEWS Score 2  MEWS Score Color Yellow  Assess: if the MEWS score is Yellow or Red  Were vital signs taken at a resting state? Yes  Focused Assessment No change from prior assessment  Early Detection of Sepsis Score *See Row Information* Low  MEWS guidelines implemented *See Row Information* Yes  Treat  MEWS Interventions Other (Comment) (placed [place] at 2L)  Pain Scale 0-10  Pain Score 0  Take Vital Signs  Increase Vital Sign Frequency  Yellow: Q 2hr X 2 then Q 4hr X 2, if remains yellow, continue Q 4hrs  Escalate  MEWS: Escalate Yellow: discuss with charge nurse/RN and consider discussing with provider and RRT  Notify: Charge Nurse/RN  Name of Charge Nurse/RN Notified Tom,RN  Date Charge Nurse/RN Notified 08/10/20  Time Charge Nurse/RN Notified 0230  Document  Progress note created (see row info) Yes   Pt arrived from ED. Scored yellow MEWS from Red. Providing supportive measures to help with breathing. O2 at 2L, cough medicine, HOB elevated.

## 2020-08-10 NOTE — Progress Notes (Signed)
PROGRESS NOTE  Brief Narrative: Meredith Chen is a 39 y.o. female [phone] at [redacted]w[redacted]d with recent covid-19 diagnosis (10/19 by home antigen test, confirmed at [facility] 10/20) who presented to the ED 10/26 with cough and dyspnea. CXR demonstrated peripheral and basilar predominant opacities, CRP 1.9, PCT negative, WBC wnl, d-dimer 0.99. She was tachypneic with room air SpO2 96% at rest. Remdesivir and decadron were started and she was admitted this morning by Dr. Linda Hedges.  Subjective: Feels about the same as at admission, very weak and tired, not getting up and feels short of breath when she does. Cough is severe associated with shortness of breath. No vaginal bleeding, abd pain, HA, swelling. Oxygen was placed last night though no definite hypoxia was documented.  Objective: BP 129/81 (BP Location: Left Arm)   Pulse 98   Temp 98.4 F (36.9 C) (Oral)   Resp (!) 22   Ht 5\' 9"  (1.753 m)   Wt 103 kg   LMP 06/01/2020   SpO2 96%   BMI 33.52 kg/m   Gen: Ill-appearing 39yo F in no distress Pulm: Tachypneic without crackles or wheezing  CV: RRR, no murmur, no JVD, no edema GI: Soft, NT, ND, +BS  Neuro: Alert and oriented. No focal deficits. Skin: No rashes, lesions or ulcers  Assessment & Plan: Covid-19 pneumonia: Positive home Ag test 10/19 confirmed at [facility] the next day.  - Continue remdesivir x5 days and decadron. If no oxygen needs over next 24 hours, would stop decadron at 5 days.   First trimester pregnancy: Known to pt PTA.  - Establish OB care after discharge.  - No VB, LOF, pain  Hyponatremia:  - Continue isotonic saline until taking adequate po  Anemia: NOS.  - Start po iron in prenatal vitamin.   Bundle branch block was reported to RN by CCMD. This is not seen on my telemetry review this morning and not present on ECG on my personal review this afternoon.    Patrecia Pour, MD Pager on amion 08/10/2020, 6:20 PM

## 2020-08-10 NOTE — Plan of Care (Signed)

## 2020-08-10 NOTE — Progress Notes (Signed)
   08/10/20 0551  Assess: MEWS Score  Temp 98.9 F (37.2 C)  BP 119/75  Pulse Rate 98  Resp 18  SpO2 98 %  Assess: MEWS Score  MEWS Temp 0  MEWS Systolic 0  MEWS Pulse 0  MEWS RR 0  MEWS LOC 0  MEWS Score 0  MEWS Score Color Green  Assess: if the MEWS score is Yellow or Red  Were vital signs taken at a resting state? Yes  Focused Assessment No change from prior assessment  Early Detection of Sepsis Score *See Row Information* Medium  MEWS guidelines implemented *See Row Information* Yes  Treat  Pain Scale 0-10  Pain Score 0  Document  Patient Outcome Stabilized after interventions  Progress note created (see row info) Yes   Patient afebrile. HR decreased. RR<20. Pt resting comfortable. IVF infusing as ordered. Patient continues on MEWS protocol. Will continue to monitor.

## 2020-08-11 DIAGNOSIS — O169 Unspecified maternal hypertension, unspecified trimester: Secondary | ICD-10-CM

## 2020-08-11 LAB — COMPREHENSIVE METABOLIC PANEL
ALT: 36 U/L (ref 0–44)
AST: 36 U/L (ref 15–41)
Albumin: 2.7 g/dL — ABNORMAL LOW (ref 3.5–5.0)
Alkaline Phosphatase: 64 U/L (ref 38–126)
Anion gap: 10 (ref 5–15)
BUN: 8 mg/dL (ref 6–20)
CO2: 20 mmol/L — ABNORMAL LOW (ref 22–32)
Calcium: 8.7 mg/dL — ABNORMAL LOW (ref 8.9–10.3)
Chloride: 107 mmol/L (ref 98–111)
Creatinine, Ser: 0.45 mg/dL (ref 0.44–1.00)
GFR, Estimated: >60 mL/min (ref >60)
Glucose, Bld: 150 mg/dL — ABNORMAL HIGH (ref 70–99)
Potassium: 3.3 mmol/L — ABNORMAL LOW (ref 3.5–5.1)
Sodium: 137 mmol/L (ref 135–145)
Total Bilirubin: 0.4 mg/dL (ref 0.3–1.2)
Total Protein: 6.9 g/dL (ref 6.5–8.1)

## 2020-08-11 LAB — CBC WITH DIFFERENTIAL/PLATELET
Abs Immature Granulocytes: 0.01 10*3/uL (ref 0.00–0.07)
Basophils Absolute: 0 10*3/uL (ref 0.0–0.1)
Basophils Relative: 0 %
Eosinophils Absolute: 0 10*3/uL (ref 0.0–0.5)
Eosinophils Relative: 0 %
HCT: 29.2 % — ABNORMAL LOW (ref 36.0–46.0)
Hemoglobin: 8.8 g/dL — ABNORMAL LOW (ref 12.0–15.0)
Immature Granulocytes: 0 %
Lymphocytes Relative: 22 %
Lymphs Abs: 0.6 10*3/uL — ABNORMAL LOW (ref 0.7–4.0)
MCH: 21.2 pg — ABNORMAL LOW (ref 26.0–34.0)
MCHC: 30.1 g/dL (ref 30.0–36.0)
MCV: 70.2 fL — ABNORMAL LOW (ref 80.0–100.0)
Monocytes Absolute: 0.2 10*3/uL (ref 0.1–1.0)
Monocytes Relative: 6 %
Neutro Abs: 2.1 10*3/uL (ref 1.7–7.7)
Neutrophils Relative %: 72 %
Platelets: 275 10*3/uL (ref 150–400)
RBC: 4.16 MIL/uL (ref 3.87–5.11)
RDW: 22.2 % — ABNORMAL HIGH (ref 11.5–15.5)
WBC: 2.9 10*3/uL — ABNORMAL LOW (ref 4.0–10.5)
nRBC: 0 % (ref 0.0–0.2)

## 2020-08-11 LAB — C-REACTIVE PROTEIN: CRP: 1.2 mg/dL — ABNORMAL HIGH (ref <1.0)

## 2020-08-11 NOTE — Progress Notes (Signed)
Patient scheduled for outpatient Remdesivir infusions at 11am on Friday 10/29, Saturday 10/30, and Monday 10/31 at [facility]. Please inform the patient to park at [place], as staff will be escorting the patient through the [place] entrance of the hospital. Appointments take approximately 45 minutes.    There is a wave flag banner located near the entrance on [address]. Turn into this entrance and immediately turn left or right and park in 1 of the 10 designated Covid Infusion Parking spots. There is a phone number on the sign, please call and let the staff know what spot you are in and we will come out and get you. For questions call [phone].  Thanks.

## 2020-08-11 NOTE — Discharge Summary (Signed)
Physician Discharge Summary  Meredith Chen IRW:[id number] DOB: 1981/02/11 DOA: 08/09/2020  PCP: Patient, No Pcp Per  Admit date: 08/09/2020 Discharge date: 08/11/2020  Admitted From: Home Disposition: Home   Recommendations for Outpatient Follow-up:  1. Follow up with PCP/OBGYN  Home Health: None Equipment/Devices: None Discharge Condition: Stable CODE STATUS: Full Diet recommendation: Regular  Brief/Interim Summary: Meredith Chen is a 39 y.o. female [phone] at [redacted]w[redacted]d with recent covid-19 diagnosis (10/19 by home antigen test, confirmed at [facility] 10/20) who presented to the ED 10/26 with cough and dyspnea. CXR demonstrated peripheral and basilar predominant opacities, CRP 1.9, PCT negative, WBC wnl, d-dimer 0.99. She was tachypneic with room air SpO2 96% at rest. Remdesivir and decadron were started and she was admitted. Overall she has improved and remained without hypoxia. She is, therefore, stable for discharge to complete the course of remdesivir as an outpatient and no longer requires steroids. See below for details.   Discharge Diagnoses:  Active Problems:   Anemia   Elevated blood pressure complicating pregnancy, antepartum   Dehydration during pregnancy   Hyponatremia   Pneumonia due to COVID-19 virus  Covid-19 pneumonia: Positive home Ag test 10/19 confirmed at [facility] the next day.  - Continue remdesivir x5 doses (arranged prior to discharge). Will not continue steroid as she's not needed oxygen regularly.   First trimester pregnancy: Known to pt PTA. +FHT on day of discharge. No VB, LOF, pain - Establish OB care after discharge.   Hyponatremia:  - Resolved with IVF and improved po intake.  Anemia: NOS.  - Continue iron in prenatal vitamin.   Obesity: Estimated body mass index is 33.52 kg/m as calculated from the following:   Height as of this encounter: 5\' 9"  (1.753 m).   Weight as of this encounter: 103 kg.   Discharge Instructions Discharge  Instructions    Discharge instructions   Complete by: As directed    You are being discharged from the hospital after treatment for covid-19 infection. You are felt to be stable enough to no longer require inpatient monitoring, testing, and treatment, though you will need to follow the recommendations below: - Continue taking remdesivir daily at the infusion center starting tomorrow. Directions are provided elsewhere on discharge paperwork. This is a new medication authorized through the FDA for treatment of covid infections even in pregnant patients, though the full long term risks are not known.  - Follow up with your doctor in the next week via telehealth or seek medical attention right away if your symptoms get WORSE. [facility] seeing patients Tuesday and Thursday mornings from 8:00am-12:00pm. Appointments are scheduled by calling [phone] (8-5 M-F). Otherwise follow up with your [facility] physician after your isolation period ends (which will be 21 days from your first positive test). - You are still encouraged to get a covid vaccination between 21 days (after isolation period ends) and 90 days (before immunity is thought to wear off).  Directions for you at home:  Wear a facemask You should wear a facemask that covers your nose and mouth when you are in the same room with other people and when you visit a healthcare provider. People who live with or visit you should also wear a facemask while they are in the same room with you.  Separate yourself from other people in your home As much as possible, you should stay in a different room from other people in your home. Also, you should use a separate bathroom, if available.  Avoid sharing household items You should not share dishes, drinking glasses, cups, eating utensils, towels, bedding, or other items with other people in your home. After using these items, you should wash them thoroughly with soap and water.  Cover your coughs  and sneezes Cover your mouth and nose with a tissue when you cough or sneeze, or you can cough or sneeze into your sleeve. Throw used tissues in a lined trash can, and immediately wash your hands with soap and water for at least 20 seconds or use an alcohol-based hand rub.  Wash your [facility] your hands often and thoroughly with soap and water for at least 20 seconds. You can use an alcohol-based hand sanitizer if soap and water are not available and if your hands are not visibly dirty. Avoid touching your eyes, nose, and mouth with unwashed hands.  Directions for those who live with, or provide care at home for you:  Limit the number of people who have contact with the patient If possible, have only one caregiver for the patient. Other household members should stay in another home or place of residence. If this is not possible, they should stay in another room, or be separated from the patient as much as possible. Use a separate bathroom, if available. Restrict visitors who do not have an essential need to be in the home.  Ensure good ventilation Make sure that shared spaces in the home have good air flow, such as from an air conditioner or an opened window, weather permitting.  Wash your hands often Wash your hands often and thoroughly with soap and water for at least 20 seconds. You can use an alcohol based hand sanitizer if soap and water are not available and if your hands are not visibly dirty. Avoid touching your eyes, nose, and mouth with unwashed hands. Use disposable paper towels to dry your hands. If not available, use dedicated cloth towels and replace them when they become wet.  Wear a facemask and gloves Wear a disposable facemask at all times in the room and gloves when you touch or have contact with the patient's blood, body fluids, and/or secretions or excretions, such as sweat, saliva, sputum, nasal mucus, vomit, urine, or feces.  Ensure the mask fits over your nose  and mouth tightly, and do not touch it during use. Throw out disposable facemasks and gloves after using them. Do not reuse. Wash your hands immediately after removing your facemask and gloves. If your personal clothing becomes contaminated, carefully remove clothing and launder. Wash your hands after handling contaminated clothing. Place all used disposable facemasks, gloves, and other waste in a lined container before disposing them with other household waste. Remove gloves and wash your hands immediately after handling these items.  Do not share dishes, glasses, or other household items with the patient Avoid sharing household items. You should not share dishes, drinking glasses, cups, eating utensils, towels, bedding, or other items with a patient who is confirmed to have, or being evaluated for, COVID-19 infection. After the person uses these items, you should wash them thoroughly with soap and water.  Wash laundry thoroughly Immediately remove and wash clothes or bedding that have blood, body fluids, and/or secretions or excretions, such as sweat, saliva, sputum, nasal mucus, vomit, urine, or feces, on them. Wear gloves when handling laundry from the patient. Read and follow directions on labels of laundry or clothing items and detergent. In general, wash and dry with the warmest temperatures recommended on the label.  Clean all areas the individual has used often Clean all touchable surfaces, such as counters, tabletops, doorknobs, bathroom fixtures, toilets, phones, keyboards, tablets, and bedside tables, every day. Also, clean any surfaces that may have blood, body fluids, and/or secretions or excretions on them. Wear gloves when cleaning surfaces the patient has come in contact with. Use a diluted bleach solution (e.g., dilute bleach with 1 part bleach and 10 parts water) or a household disinfectant with a label that says EPA-registered for coronaviruses. To make a bleach solution at  home, add 1 tablespoon of bleach to 1 quart (4 cups) of water. For a larger supply, add  cup of bleach to 1 gallon (16 cups) of water. Read labels of cleaning products and follow recommendations provided on product labels. Labels contain instructions for safe and effective use of the cleaning product including precautions you should take when applying the product, such as wearing gloves or eye protection and making sure you have good ventilation during use of the product. Remove gloves and wash hands immediately after cleaning.  Monitor yourself for signs and symptoms of illness Caregivers and household members are considered close contacts, should monitor their health, and will be asked to limit movement outside of the home to the extent possible. Follow the monitoring steps for close contacts listed on the symptom monitoring form.  If you have additional questions, contact your local health department or call the epidemiologist on call at [phone] (available 24/7). This guidance is subject to change. For the most up-to-date guidance from [facility], please refer to their website: YouBlogs.pl   MyChart COVID-19 home monitoring program   Complete by: Aug 11, 2020    Is the patient willing to use the [facility] for home monitoring?: Yes     Allergies as of 08/11/2020   No Known Allergies     Medication List    TAKE these medications   artificial tears Oint ophthalmic ointment Commonly known as: LACRILUBE Place into the left eye every 4 (four) hours as needed for dry eyes.       No Known Allergies  Consultations:  None  Procedures/Studies: DG Chest Port 1 View  Result Date: 08/09/2020 CLINICAL DATA:  Cough COVID EXAM: PORTABLE CHEST 1 VIEW COMPARISON:  None. FINDINGS: Possible mild ground-glass opacity in the left base and periphery of left lung. Normal heart size. No pneumothorax. IMPRESSION: Possible mild  ground-glass opacity/pneumonia in the left lung base and periphery of the left lung. Electronically Signed   By: Donavan Foil M.D.   On: 08/09/2020 22:32     Subjective: Feels well, eating better, less cough but that still causes mild dyspnea. No shortness of breath otherwise. No VB, LOF, abd pain, HA.  Discharge Exam: Vitals:   08/10/20 2124 08/11/20 0614  BP: 127/79 122/72  Pulse: 86 76  Resp: 17 20  Temp: 98.1 F (36.7 C) (!) 97.5 F (36.4 C)  SpO2: 100% 96%   General: Pt is alert, awake, not in acute distress Cardiovascular: RRR, S1/S2 +, no rubs, no gallops Respiratory: CTA bilaterally, no wheezing, no rhonchi Abdominal: Soft, NT, ND, bowel sounds + Extremities: No edema, no cyanosis  Labs: BNP (last 3 results) No results for input(s): BNP in the last 8760 hours. Basic Metabolic Panel: Recent Labs  Lab 08/09/20 2212 08/11/20 0311  NA 129* 137  K 4.3 3.3*  CL 98 107  CO2 21* 20*  GLUCOSE 97 150*  BUN 8 8  CREATININE 0.83 0.45  CALCIUM 8.3* 8.7*  Liver Function Tests: Recent Labs  Lab 08/09/20 2212 08/11/20 0311  AST 90* 36  ALT 33 36  ALKPHOS 73 64  BILITOT 0.7 0.4  PROT 8.5* 6.9  ALBUMIN 3.3* 2.7*   No results for input(s): LIPASE, AMYLASE in the last 168 hours. No results for input(s): AMMONIA in the last 168 hours. CBC: Recent Labs  Lab 08/09/20 2212 08/11/20 0311  WBC 3.4* 2.9*  NEUTROABS 2.2 2.1  HGB 10.0* 8.8*  HCT 33.2* 29.2*  MCV 69.0* 70.2*  PLT 322 275   Cardiac Enzymes: No results for input(s): CKTOTAL, CKMB, CKMBINDEX, TROPONINI in the last 168 hours. BNP: Invalid input(s): POCBNP CBG: No results for input(s): GLUCAP in the last 168 hours. D-Dimer Recent Labs    08/09/20 2212  DDIMER 0.99*   Hgb A1c No results for input(s): HGBA1C in the last 72 hours. Lipid Profile Recent Labs    08/09/20 2212  TRIG 107   Thyroid function studies No results for input(s): TSH, T4TOTAL, T3FREE, THYROIDAB in the last 72  hours.  Invalid input(s): FREET3 Anemia work up Recent Labs    08/09/20 2212  FERRITIN 26   Urinalysis    Component Value Date/Time   COLORURINE YELLOW 07/09/2020 [place] 07/09/2020 1024   LABSPEC 1.021 07/09/2020 1024   PHURINE 5.0 07/09/2020 1024   GLUCOSEU NEGATIVE 07/09/2020 1024   HGBUR LARGE (A) 07/09/2020 1024   BILIRUBINUR NEGATIVE 07/09/2020 1024   KETONESUR NEGATIVE 07/09/2020 1024   PROTEINUR 30 (A) 07/09/2020 1024   UROBILINOGEN 1.0 06/08/2013 1006   NITRITE NEGATIVE 07/09/2020 1024   LEUKOCYTESUR NEGATIVE 07/09/2020 1024    Microbiology Recent Results (from the past 240 hour(s))  Respiratory Panel by RT PCR (Flu A&B, Covid) - Nasopharyngeal Swab     Status: Abnormal   Collection Time: 08/09/20  9:58 PM   Specimen: Nasopharyngeal Swab  Result Value Ref Range Status   SARS Coronavirus 2 by RT PCR POSITIVE (A) NEGATIVE Final    Comment: RESULT CALLED TO, READ BACK BY AND VERIFIED WITH: SMITH J 10.26.21 @ 2321 BY MECIAL J. (NOTE) SARS-CoV-2 target nucleic acids are DETECTED.  SARS-CoV-2 RNA is generally detectable in upper respiratory specimens  during the acute phase of infection. Positive results are indicative of the presence of the identified virus, but do not rule out bacterial infection or co-infection with other pathogens not detected by the test. Clinical correlation with patient history and other diagnostic information is necessary to determine patient infection status. The expected result is Negative.  Fact Sheet for Patients:  PinkCheek.be  Fact Sheet for Healthcare Providers: GravelBags.it  This test is not yet approved or cleared by the [place] FDA and  has been authorized for detection and/or diagnosis of SARS-CoV-2 by FDA under an Emergency Use Authorization (EUA).  This EUA will remain in effect (meaning this test can b e used) for the duration of  the  COVID-19 declaration under Section 564(b)(1) of the Act, 21 U.S.C. section 360bbb-3(b)(1), unless the authorization is terminated or revoked sooner.      Influenza A by PCR NEGATIVE NEGATIVE Final   Influenza B by PCR NEGATIVE NEGATIVE Final    Comment: (NOTE) The Xpert Xpress SARS-CoV-2/FLU/RSV assay is intended as an aid in  the diagnosis of influenza from Nasopharyngeal swab specimens and  should not be used as a sole basis for treatment. Nasal washings and  aspirates are unacceptable for Xpert Xpress SARS-CoV-2/FLU/RSV  testing.  Fact Sheet for Patients: PinkCheek.be  Fact  Sheet for Healthcare Providers: GravelBags.it  This test is not yet approved or cleared by the [place] and  has been authorized for detection and/or diagnosis of SARS-CoV-2 by  FDA under an Emergency Use Authorization (EUA). This EUA will remain  in effect (meaning this test can be used) for the duration of the  Covid-19 declaration under Section 564(b)(1) of the Act, 21  U.S.C. section 360bbb-3(b)(1), unless the authorization is  terminated or revoked. Performed at [facility], [place] [address] [place], [place] [address]   Blood Culture (routine x 2)     Status: None (Preliminary result)   Collection Time: 08/09/20 10:12 PM   Specimen: BLOOD  Result Value Ref Range Status   Specimen Description   Final    BLOOD LEFT ANTECUBITAL Performed at [facility] [address], [place], [place] [address]    Special Requests   Final    BOTTLES DRAWN AEROBIC AND ANAEROBIC Blood Culture results may not be optimal due to an excessive volume of blood received in culture bottles Performed at [facility] [address], [place], [place] [address]    Culture   Final    NO GROWTH 2 DAYS Performed at [facility] [address], [place], [place] [address]    Report Status PENDING   Incomplete  Blood Culture (routine x 2)     Status: None (Preliminary result)   Collection Time: 08/10/20  2:30 AM   Specimen: BLOOD  Result Value Ref Range Status   Specimen Description   Final    BLOOD RIGHT ANTECUBITAL Performed at [facility] [address] [place], [place] [address]    Special Requests   Final    BOTTLES DRAWN AEROBIC AND ANAEROBIC Blood Culture results may not be optimal due to an inadequate volume of blood received in culture bottles Performed at [facility] [address] [place], [place] [address]    Culture   Final    NO GROWTH 1 DAY Performed at [facility], [place] [address], [place], [place] [address]    Report Status PENDING  Incomplete    Time coordinating discharge: Approximately 40 minutes  Patrecia Pour, MD  [facility] 08/11/2020, 10:56 AM

## 2020-08-11 NOTE — Discharge Instructions (Signed)
Patient scheduled for outpatient Remdesivir infusions at 11am on Friday 10/29, Saturday 10/30, and Monday 10/31 at [facility]. Please inform the patient to park at [place], as staff will be escorting the patient through the [place] entrance of the hospital. Appointments take approximately 45 minutes.    There is a wave flag banner located near the entrance on [address]. Turn into this entrance and immediately turn left or right and park in 1 of the 10 designated Covid Infusion Parking spots. There is a phone number on the sign, please call and let the staff know what spot you are in and we will come out and get you. For questions call [phone].  Thanks.

## 2020-08-11 NOTE — Progress Notes (Signed)
This patient will be discharging home with sister. Her belongings were returned to patient. Education on medications will be provided.

## 2020-08-12 ENCOUNTER — Ambulatory Visit (HOSPITAL_COMMUNITY): Payer: Medicaid Other | Attending: Pulmonary Disease

## 2020-08-13 ENCOUNTER — Ambulatory Visit (HOSPITAL_COMMUNITY)
Admit: 2020-08-13 | Discharge: 2020-08-13 | Disposition: A | Discharge status: Home / Self Care | Payer: HRSA Program | Source: Ambulatory Visit | Attending: Pulmonary Disease | Admitting: Pulmonary Disease

## 2020-08-13 DIAGNOSIS — U071 COVID-19: Secondary | ICD-10-CM | POA: Insufficient documentation

## 2020-08-13 DIAGNOSIS — J1282 Pneumonia due to coronavirus disease 2019: Secondary | ICD-10-CM | POA: Insufficient documentation

## 2020-08-13 MED ORDER — FAMOTIDINE IN NACL 20-0.9 MG/50ML-% IV SOLN: 20.0000 mg | Freq: Once | INTRAVENOUS | Status: DC | PRN

## 2020-08-13 MED ORDER — SODIUM CHLORIDE 0.9 % IV SOLN
100.0000 mg | Freq: Once | INTRAVENOUS | Status: AC
  Administered 2020-08-13: 100 mg via INTRAVENOUS
  Filled 2020-08-13: qty 20

## 2020-08-13 MED ORDER — EPINEPHRINE 0.3 MG/0.3ML IJ SOAJ: 0.3000 mg | Freq: Once | INTRAMUSCULAR | Status: DC | PRN

## 2020-08-13 MED ORDER — ALBUTEROL SULFATE HFA 108 (90 BASE) MCG/ACT IN AERS: 2.0000 | INHALATION_SPRAY | Freq: Once | RESPIRATORY_TRACT | Status: DC | PRN

## 2020-08-13 MED ORDER — METHYLPREDNISOLONE SODIUM SUCC 125 MG IJ SOLR: 125.0000 mg | Freq: Once | INTRAMUSCULAR | Status: DC | PRN

## 2020-08-13 MED ORDER — SODIUM CHLORIDE 0.9 % IV SOLN: INTRAVENOUS | Status: DC | PRN

## 2020-08-13 MED ORDER — DIPHENHYDRAMINE HCL 50 MG/ML IJ SOLN: 50.0000 mg | Freq: Once | INTRAMUSCULAR | Status: DC | PRN

## 2020-08-13 NOTE — Discharge Instructions (Signed)
10 Things You Can Do to Manage Your COVID-19 Symptoms at Home If you have possible or confirmed COVID-19: 1. Stay home from work and school. And stay away from other public places. If you must go out, avoid using any kind of public transportation, ridesharing, or taxis. 2. Monitor your symptoms carefully. If your symptoms get worse, call your healthcare provider immediately. 3. Get rest and stay hydrated. 4. If you have a medical appointment, call the healthcare provider ahead of time and tell them that you have or may have COVID-19. 5. For medical emergencies, call 911 and notify the dispatch personnel that you have or may have COVID-19. 6. Cover your cough and sneezes with a tissue or use the inside of your elbow. 7. Wash your hands often with soap and water for at least 20 seconds or clean your hands with an alcohol-based hand sanitizer that contains at least 60% alcohol. 8. As much as possible, stay in a specific room and away from other people in your home. Also, you should use a separate bathroom, if available. If you need to be around other people in or outside of the home, wear a mask. 9. Avoid sharing personal items with other people in your household, like dishes, towels, and bedding. 10. Clean all surfaces that are touched often, like counters, tabletops, and doorknobs. Use household cleaning sprays or wipes according to the label instructions. cdc.gov/coronavirus 04/15/2019 This information is not intended to replace advice given to you by your health care provider. Make sure you discuss any questions you have with your health care provider. Document Revised: 09/17/2019 Document Reviewed: 09/17/2019 Elsevier Patient Education  2020 Elsevier Inc.  

## 2020-08-13 NOTE — Progress Notes (Signed)
  Diagnosis: COVID-19  Physician: Dr. Asencion Noble  Procedure: Covid Infusion Clinic Med: remdesivir infusion - Provided patient with remdesivir fact sheet for patients, parents and caregivers prior to infusion.  Complications: No immediate complications noted.  Discharge: Discharged home   Meredith Chen 08/13/2020

## 2020-08-14 LAB — CULTURE, BLOOD (ROUTINE X 2): Culture: NO GROWTH

## 2020-08-15 ENCOUNTER — Ambulatory Visit (HOSPITAL_COMMUNITY): Payer: Medicaid Other

## 2020-08-15 LAB — CULTURE, BLOOD (ROUTINE X 2): Culture: NO GROWTH

## 2020-08-16 ENCOUNTER — Ambulatory Visit (HOSPITAL_COMMUNITY): Payer: Medicaid Other | Attending: Pulmonary Disease

## 2020-09-13 NOTE — ED Provider Notes (Signed)
Addendum to note 08/09/2020 WDWN female  HR 128 RR 20 sats 97% bp 130/90 HEENT- normal Neck- no JVD Lungs- tachypnea notes CV- tachycardia with regular rhythm Abdomen- nttp Extremities - no injury, no cords Neuro- no focal deficits Psych- normal mood, anxious   Pattricia Boss, MD 09/13/20 1304

## 2020-09-15 ENCOUNTER — Inpatient Hospital Stay (HOSPITAL_COMMUNITY)
Admission: AD | Admit: 2020-09-15 | Discharge: 2020-09-15 | Discharge status: Against Medical Advice | Payer: Medicaid Other | Attending: Family Medicine | Admitting: Family Medicine

## 2020-09-15 ENCOUNTER — Other Ambulatory Visit: Payer: Self-pay

## 2020-09-15 NOTE — Progress Notes (Signed)
Pt called and not in lobby

## 2020-09-15 NOTE — MAU Note (Signed)
Per registration pt left AMA

## 2020-09-16 ENCOUNTER — Inpatient Hospital Stay (HOSPITAL_COMMUNITY)
Admission: AD | Admit: 2020-09-16 | Discharge: 2020-09-16 | Disposition: A | Discharge status: Home / Self Care | Payer: Medicaid Other | Attending: Obstetrics & Gynecology | Admitting: Obstetrics & Gynecology

## 2020-09-16 ENCOUNTER — Other Ambulatory Visit: Payer: Self-pay

## 2020-09-16 DIAGNOSIS — Z3A15 15 weeks gestation of pregnancy: Secondary | ICD-10-CM | POA: Insufficient documentation

## 2020-09-16 DIAGNOSIS — O10912 Unspecified pre-existing hypertension complicating pregnancy, second trimester: Secondary | ICD-10-CM | POA: Insufficient documentation

## 2020-09-16 DIAGNOSIS — Z711 Person with feared health complaint in whom no diagnosis is made: Secondary | ICD-10-CM | POA: Insufficient documentation

## 2020-09-16 DIAGNOSIS — Z3A19 19 weeks gestation of pregnancy: Secondary | ICD-10-CM

## 2020-09-16 DIAGNOSIS — Z3A16 16 weeks gestation of pregnancy: Secondary | ICD-10-CM

## 2020-09-16 DIAGNOSIS — O98511 Other viral diseases complicating pregnancy, first trimester: Secondary | ICD-10-CM

## 2020-09-16 DIAGNOSIS — U071 COVID-19: Secondary | ICD-10-CM

## 2020-09-16 DIAGNOSIS — O10919 Unspecified pre-existing hypertension complicating pregnancy, unspecified trimester: Secondary | ICD-10-CM | POA: Diagnosis present

## 2020-09-16 NOTE — MAU Note (Signed)
Having trouble getting medicaid switched over.  About a month ago had covid pneumonia.  Wanting to get checked to make sure everything is ok with the baby.  Denies pain, bleeding or d/c.

## 2020-09-16 NOTE — Discharge Instructions (Signed)
Prenatal Care Providers           [facility]  [address] [phone]  [facility]   [address]  [phone]  [facility]       [address] [phone]            [facility] @ [facility]     [address] [phone]          [facility] @ [facility]   [address] [phone]  [facility]  [address] [phone]     [facility])  [address]   [phone]     [facility]  Phone: [phone]  [facility]  Phone: [phone]  [facility] Phone: [phone]  [facility] Phone: [phone]  [facility] Phone: [phone]  [facility] Phone: [phone]  [facility]  Phone: [phone]  

## 2020-09-16 NOTE — MAU Provider Note (Signed)
Chief Complaint: wants check up   First Provider Initiated Contact with Patient 09/16/20 1044     covid admit in oct, pt not started care due to insurance, worried  SUBJECTIVE HPI: Meredith Chen is a 39 y.o. H4V4259 at [redacted]w[redacted]d by early ultrasound who presents to maternity admissions because she has not started care and is concerned.  She had COVID and was admitted to the hospital in October and is worried that that could affect the pregnancy. She denies any abdominal pain or bleeding today. There are no other s/sx.  She tried to call a few offices but because her Medicaid has not started yet, she has been unable to make an appointment. She is feeling fetal movement.    HPI  Past Medical History:  Diagnosis Date  . Abnormal Pap smear   . Chlamydia   . Medical history non-contributory   . Trichomonas    January 2014   Past Surgical History:  Procedure Laterality Date  . CESAREAN SECTION     Social History   Socioeconomic History  . Marital status: Single    Spouse name: Not on file  . Number of children: Not on file  . Years of education: Not on file  . Highest education level: Not on file  Occupational History  . Not on file  Tobacco Use  . Smoking status: Never Smoker  . Smokeless tobacco: Never Used  Substance and Sexual Activity  . Alcohol use: Not Currently    Comment: social  . Drug use: No  . Sexual activity: Yes    Birth control/protection: None    Comment: Last intercourse Dec.  Other Topics Concern  . Not on file  Social History Narrative  . Not on file   Social Determinants of Health   Financial Resource Strain:   . Difficulty of Paying Living Expenses: Not on file  Food Insecurity:   . Worried About Charity fundraiser in the Last Year: Not on file  . Ran Out of Food in the Last Year: Not on file  Transportation Needs:   . Lack of Transportation (Medical): Not on file  . Lack of Transportation (Non-Medical): Not on file  Physical Activity:   .  Days of Exercise per Week: Not on file  . Minutes of Exercise per Session: Not on file  Stress:   . Feeling of Stress : Not on file  Social Connections:   . Frequency of Communication with Friends and Family: Not on file  . Frequency of Social Gatherings with Friends and Family: Not on file  . Attends Religious Services: Not on file  . Active Member of Clubs or Organizations: Not on file  . Attends Archivist Meetings: Not on file  . Marital Status: Not on file  Intimate Partner Violence:   . Fear of Current or Ex-Partner: Not on file  . Emotionally Abused: Not on file  . Physically Abused: Not on file  . Sexually Abused: Not on file   No current facility-administered medications on file prior to encounter.   Current Outpatient Medications on File Prior to Encounter  Medication Sig Dispense Refill  . artificial tears (LACRILUBE) OINT ophthalmic ointment Place into the left eye every 4 (four) hours as needed for dry eyes. (Patient not taking: Reported on 08/09/2020) 1 g 0   No Known Allergies  ROS:  Review of Systems  Constitutional: Negative for chills, fatigue and fever.  Respiratory: Negative for shortness of breath.   Cardiovascular: Negative  for chest pain.  Gastrointestinal: Negative for nausea and vomiting.  Genitourinary: Negative for difficulty urinating, dysuria, flank pain, pelvic pain, vaginal bleeding, vaginal discharge and vaginal pain.  Neurological: Negative for dizziness and headaches.  Psychiatric/Behavioral: Negative.      I have reviewed patient's Past Medical Hx, Surgical Hx, Family Hx, Social Hx, medications and allergies.   Physical Exam   Patient Vitals for the past 24 hrs:  BP Temp Temp src Pulse Resp SpO2 Height Weight  09/16/20 0835 140/84 98.5 F (36.9 C) Oral 94 16 100 % 5\' 9"  (1.753 m) 106.1 kg   Constitutional: Well-developed, well-nourished female in no acute distress.  Cardiovascular: normal rate Respiratory: normal effort GI:  Abd soft, non-tender. Pos BS x 4 MS: Extremities nontender, no edema, normal ROM Neurologic: Alert and oriented x 4.  GU: Neg CVAT.  PELVIC EXAM: Cervix pink, visually closed, without lesion, scant white creamy discharge, vaginal walls and external genitalia normal Bimanual exam: Cervix 0/long/high, firm, anterior, neg CMT, uterus nontender, nonenlarged, adnexa without tenderness, enlargement, or mass  FHT 161 by doppler  LAB RESULTS No results found for this or any previous visit (from the past 24 hour(s)).     IMAGING No results found.  MAU Management/MDM: Orders Placed This Encounter  Procedures  . [place] MFM OB DETAIL +14 WK  . Discharge patient    No orders of the defined types were placed in this encounter.   Message sent to [place] to establish care.  Outpatient [place] ordered.  Discussed likely CHTN with pt who states understanding.  No medications started today with borderline BPs.  F/U in office. Pt discharged with strict return precautions.  ASSESSMENT 1. Physically well but worried   2. [redacted] weeks gestation of pregnancy   3. Chronic hypertension complicating or reason for care during pregnancy, second trimester     PLAN Discharge home Allergies as of 09/16/2020   No Known Allergies     Medication List    TAKE these medications   artificial tears Oint ophthalmic ointment Commonly known as: [place] into the left eye every 4 (four) hours as needed for dry eyes.       Follow-up Information    Prenatal provider of your choice Follow up.   Why: See list provided              Fatima Blank Certified Nurse-Midwife 09/16/2020  11:26 AM

## 2020-09-27 ENCOUNTER — Encounter: Payer: Medicaid Other | Admitting: Family Medicine

## 2020-09-28 ENCOUNTER — Other Ambulatory Visit: Payer: Self-pay

## 2020-09-28 ENCOUNTER — Encounter: Payer: Self-pay | Admitting: *Deleted

## 2020-09-28 ENCOUNTER — Telehealth (INDEPENDENT_AMBULATORY_CARE_PROVIDER_SITE_OTHER): Payer: Medicaid Other | Admitting: *Deleted

## 2020-09-28 DIAGNOSIS — O0990 Supervision of high risk pregnancy, unspecified, unspecified trimester: Secondary | ICD-10-CM

## 2020-09-28 DIAGNOSIS — Z8616 Personal history of COVID-19: Secondary | ICD-10-CM | POA: Insufficient documentation

## 2020-09-28 DIAGNOSIS — U071 COVID-19: Secondary | ICD-10-CM

## 2020-09-28 DIAGNOSIS — Z3A00 Weeks of gestation of pregnancy not specified: Secondary | ICD-10-CM

## 2020-09-28 DIAGNOSIS — O10912 Unspecified pre-existing hypertension complicating pregnancy, second trimester: Secondary | ICD-10-CM

## 2020-09-28 DIAGNOSIS — O99210 Obesity complicating pregnancy, unspecified trimester: Secondary | ICD-10-CM

## 2020-09-28 DIAGNOSIS — O09529 Supervision of elderly multigravida, unspecified trimester: Secondary | ICD-10-CM | POA: Insufficient documentation

## 2020-09-28 MED ORDER — BLOOD PRESSURE KIT DEVI: 1.0000 | 0 refills | Status: AC | PRN

## 2020-09-28 NOTE — Progress Notes (Signed)
0825 I called Meredith Chen and verified her identity with 2 identifiers. I asked if she is ready to do her virtual visit. She Confirmed she is . I sent her a text link. Meredith Chen was unable to connect virtually via link. I sent her a MyChart text and called her and explained how to sign up and download the app.  Meredith Coppa,RN New OB Intake  After multiple attempts Meredith Chen could not connect virtually. I connected with her by phone.  I connected with  Meredith Chen on 09/28/20 at 0844 by telephone and verified that I am speaking with the correct person using two identifiers. Nurse is located at [facility] and pt is located at home.  I discussed the limitations, risks, security and privacy concerns of performing an evaluation and management service by telephone and the availability of in person appointments. I also discussed with the patient that there may be a patient responsible charge related to this service. The patient expressed understanding and agreed to proceed.  I explained I am completing New OB Intake today. We discussed her EDD of 02/27/2021 that is based on [place] per provider per chart review.  Pt is G6/P4015. I reviewed her allergies, medications, Medical/Surgical/OB history, and appropriate screenings. I informed her of [facility] services. Based on history, this is a/an complicated by AMA, History Covid 19 pneumonia, HTN pregnancy.  Concerns addressed today  Delivery plans  Plans to deliver at [facility].   MyChart/Babyscripts MyChart access verified. I explained pt will have some visits in office and some virtually. Babyscripts instructions given. Account successfully created and app downloaded.  Blood Pressure Cuff Patient states she has a blood pressure cuff and knows how to use it.  Explained after first prenatal appt pt will check weekly and document in 51.  Anatomy US Explained first scheduled Korea will be around 19 weeks. Anatomy US has already been scheduled for 10/05/20 at 1245. Pt  notified to arrive at 1230.  Labs Discussed Meredith Chen genetic screening with patient. She is undecided about  [place] [facility].  Routine prenatal labs needed.  [facility] referral- she accepted. Referral sent.  COVID vaccine Has not had vaccine and not interested in information.  New patient meeting Informed of monthly Zoom meeting and placed information in AVS.   First visit review I reviewed new OB appt with pt. I explained she will have a pelvic exam, ob bloodwork with genetic screening if desired, and PAP smear. Explained pt will be seen by Dr. Dione Plover at first visit; encounter routed to appropriate provider.  Meredith Bloodsaw,RN 09/28/2020  8:38 AM

## 2020-09-28 NOTE — Patient Instructions (Addendum)
- At our [facility] OB[facility] Practices, we work as an integrated team, providing care to address both physical and emotional health. Your medical provider may refer you to see our [facility]) on the same day you see your medical provider, as availability permits; often scheduled virtually at your convenience.  Our [facility] is available to all patients, visits generally last between 20-30 minutes, but can be longer or shorter, depending on patient need. The [facility] offers help with stress management, coping with symptoms of depression and anxiety, major life changes , sleep issues, changing risky behavior, grief and loss, life stress, working on personal life goals, and  behavioral health issues, as these all affect your overall health and wellness.  The [facility] is NOT available for the following: FMLA paperwork, court-ordered evaluations, specialty assessments (custody or disability), letters to employers, or obtaining certification for an emotional support animal. The [facility] does not provide long-term therapy. You have the right to refuse integrated behavioral health services, or to reschedule to see the [facility] at a later date.  Confidentiality exception: If it is suspected that a child or disabled adult is being abused or neglected, we are required by law to report that to either Child Protective Services or Adult Scientist, forensic.  If you have a diagnosis of Bipolar affective disorder, Schizophrenia, or recurrent Major depressive disorder, we will recommend that you establish care with a psychiatrist, as these are lifelong, chronic conditions, and we want your overall emotional health and medications to be more closely monitored. If you anticipate needing extended maternity leave due to mental health issues postpartum, it it recommended you inform your medical provider, so we can put in a referral to a  psychiatrist as soon as possible. The [facility] is unable to recommend an extended maternity leave for mental  health issues. Your medical provider or [facility] may refer you to a therapist for ongoing, traditional therapy, or to a psychiatrist, for medication management, if it would benefit your overall health. Depending on your insurance, you may have a copay to see the [facility]. If you are uninsured, it is recommended that you apply for financial assistance. (Forms may be requested at the front desk for in-person visits, via MyChart, or request a form during a virtual visit).  If you see the [facility] more than 6 times, you will have to complete a comprehensive clinical assessment interview with the [facility] to resume integrated services.  For virtual visits with the [facility], you must be physically in the state of [place] at the time of the visit. For example, if you live in [place], you will have to do an in-person visit with the [facility], and your out-of-state insurance may not cover behavioral health services in [place]. f you are going out of the state or country for any reason, the [facility] may see you virtually when you return to [place], but not while you are physically outside of [place].     Meet the Provider [place] for [place] is now offering FREE monthly 1-hour virtual Zoom sessions for new, current, and prospective patients.        During these sessions, you can:   Learn about our practice, model of care, services   Get answers to questions about pregnancy and birth during [place] your provider's brain about anything else!    Sessions will be hosted by [employer] for [employer], Engineer, materials, Physicians and Midwives  No registration required      2021 Dates:      All at 6pm     October 21st     November 18th   December 16th     January 20th  February 17th    To join one of these meetings, a few minutes before it is set to start:     Copy/paste the link into your web  browser:  [email or web address]    OR  Scan the QR code below (open up your camera and point towards QR code; click on tab that pops up on your phone ("zoom")

## 2020-10-04 ENCOUNTER — Encounter: Payer: Medicaid Other | Admitting: Family Medicine

## 2020-10-05 ENCOUNTER — Other Ambulatory Visit: Payer: Self-pay | Admitting: *Deleted

## 2020-10-05 ENCOUNTER — Encounter: Payer: Self-pay | Admitting: *Deleted

## 2020-10-05 ENCOUNTER — Ambulatory Visit: Payer: Medicaid Other | Admitting: *Deleted

## 2020-10-05 ENCOUNTER — Other Ambulatory Visit: Payer: Self-pay

## 2020-10-05 ENCOUNTER — Ambulatory Visit: Payer: Medicaid Other | Attending: Obstetrics and Gynecology

## 2020-10-05 DIAGNOSIS — O10912 Unspecified pre-existing hypertension complicating pregnancy, second trimester: Secondary | ICD-10-CM

## 2020-10-05 DIAGNOSIS — Z3A19 19 weeks gestation of pregnancy: Secondary | ICD-10-CM

## 2020-10-05 DIAGNOSIS — O99212 Obesity complicating pregnancy, second trimester: Secondary | ICD-10-CM | POA: Diagnosis not present

## 2020-10-05 DIAGNOSIS — Z363 Encounter for antenatal screening for malformations: Secondary | ICD-10-CM | POA: Diagnosis not present

## 2020-10-05 DIAGNOSIS — E669 Obesity, unspecified: Secondary | ICD-10-CM | POA: Diagnosis not present

## 2020-10-05 DIAGNOSIS — O0990 Supervision of high risk pregnancy, unspecified, unspecified trimester: Secondary | ICD-10-CM

## 2020-10-05 DIAGNOSIS — O09522 Supervision of elderly multigravida, second trimester: Secondary | ICD-10-CM | POA: Diagnosis not present

## 2020-10-05 DIAGNOSIS — Z8616 Personal history of COVID-19: Secondary | ICD-10-CM

## 2020-10-11 ENCOUNTER — Other Ambulatory Visit (HOSPITAL_COMMUNITY)
Admission: RE | Admit: 2020-10-11 | Discharge: 2020-10-11 | Disposition: A | Discharge status: Home / Self Care | Payer: Medicaid Other | Source: Ambulatory Visit | Attending: Family Medicine | Admitting: Family Medicine

## 2020-10-11 ENCOUNTER — Ambulatory Visit (INDEPENDENT_AMBULATORY_CARE_PROVIDER_SITE_OTHER): Payer: Medicaid Other | Admitting: Family Medicine

## 2020-10-11 ENCOUNTER — Encounter: Payer: Self-pay | Admitting: Family Medicine

## 2020-10-11 ENCOUNTER — Other Ambulatory Visit: Payer: Self-pay

## 2020-10-11 VITALS — BP 137/85 | Wt 236.5 lb

## 2020-10-11 DIAGNOSIS — D649 Anemia, unspecified: Secondary | ICD-10-CM

## 2020-10-11 DIAGNOSIS — U071 COVID-19: Secondary | ICD-10-CM

## 2020-10-11 DIAGNOSIS — O0990 Supervision of high risk pregnancy, unspecified, unspecified trimester: Secondary | ICD-10-CM

## 2020-10-11 DIAGNOSIS — O09522 Supervision of elderly multigravida, second trimester: Secondary | ICD-10-CM

## 2020-10-11 DIAGNOSIS — O34219 Maternal care for unspecified type scar from previous cesarean delivery: Secondary | ICD-10-CM

## 2020-10-11 DIAGNOSIS — O99212 Obesity complicating pregnancy, second trimester: Secondary | ICD-10-CM

## 2020-10-11 DIAGNOSIS — J1282 Pneumonia due to coronavirus disease 2019: Secondary | ICD-10-CM

## 2020-10-11 DIAGNOSIS — Z23 Encounter for immunization: Secondary | ICD-10-CM | POA: Diagnosis not present

## 2020-10-11 DIAGNOSIS — O10912 Unspecified pre-existing hypertension complicating pregnancy, second trimester: Secondary | ICD-10-CM

## 2020-10-11 LAB — COMPREHENSIVE METABOLIC PANEL
ALT: 18 IU/L (ref 0–32)
AST: 11 IU/L (ref 0–40)
Albumin/Globulin Ratio: 1.1 — ABNORMAL LOW (ref 1.2–2.2)
Albumin: 3.7 g/dL — ABNORMAL LOW (ref 3.8–4.8)
Alkaline Phosphatase: 99 IU/L (ref 44–121)
BUN/Creatinine Ratio: 12 (ref 9–23)
BUN: 8 mg/dL (ref 6–20)
Bilirubin Total: <0.2 mg/dL (ref 0.0–1.2)
CO2: 18 mmol/L — ABNORMAL LOW (ref 20–29)
Calcium: 9.1 mg/dL (ref 8.7–10.2)
Chloride: 102 mmol/L (ref 96–106)
Creatinine, Ser: 0.65 mg/dL (ref 0.57–1.00)
GFR calc Af Amer: 130 mL/min/{1.73_m2} (ref >59)
GFR calc non Af Amer: 113 mL/min/{1.73_m2} (ref >59)
Globulin, Total: 3.5 g/dL (ref 1.5–4.5)
Glucose: 93 mg/dL (ref 65–99)
Potassium: 3.9 mmol/L (ref 3.5–5.2)
Sodium: 134 mmol/L (ref 134–144)
Total Protein: 7.2 g/dL (ref 6.0–8.5)

## 2020-10-11 LAB — POCT URINALYSIS DIP (DEVICE)
Bilirubin Urine: NEGATIVE
Glucose, UA: NEGATIVE mg/dL
Hgb urine dipstick: NEGATIVE
Ketones, ur: NEGATIVE mg/dL
Leukocytes,Ua: NEGATIVE
Nitrite: NEGATIVE
Protein, ur: NEGATIVE mg/dL
Specific Gravity, Urine: 1.025 (ref 1.005–1.030)
Urobilinogen, UA: 0.2 mg/dL (ref 0.0–1.0)
pH: 7 (ref 5.0–8.0)

## 2020-10-11 NOTE — Progress Notes (Signed)
   Subjective:  Meredith Chen is a 39 y.o. W1U2725 at 88w1dbeing seen today for ongoing prenatal care.  She is currently monitored for the following issues for this high-risk pregnancy and has Anemia; H/O cesarean section complicating pregnancy; Pneumonia due to COVID-19 virus; Chronic hypertension complicating or reason for care during pregnancy, second trimester; Supervision of high risk pregnancy, antepartum; AMA (advanced maternal age) multigravida 35+; History of 2019 novel coronavirus disease (COVID-19); and Obesity affecting pregnancy on their problem list.  Patient reports no complaints.  Contractions: Not present. Vag. Bleeding: None.  Movement: Present. Denies leaking of fluid.   The following portions of the patient's history were reviewed and updated as appropriate: allergies, current medications, past family history, past medical history, past social history, past surgical history and problem list. Problem list updated.  Objective:   Vitals:   10/11/20 1039 10/11/20 1104  BP: 118/83 137/85  Weight: 236 lb 8 oz (107.3 kg)     Fetal Status: Fetal Heart Rate (bpm): 154   Movement: Present     General:  Alert, oriented and cooperative. Patient is in no acute distress.  Skin: Skin is warm and dry. No rash noted.   Cardiovascular: Normal heart rate noted  Respiratory: Normal respiratory effort, no problems with respiration noted  Abdomen: Soft, gravid, appropriate for gestational age. Pain/Pressure: Absent     Pelvic: Vag. Bleeding: None     Cervical exam deferred        Extremities: Normal range of motion.  Edema: None  Mental Status: Normal mood and affect. Normal behavior. Normal judgment and thought content.   Urinalysis:      Assessment and Plan:  Pregnancy: [id number] 273w1d1. Supervision of high risk pregnancy, antepartum FHR and BP normal Accepts genetic screening today Reviewed structure of Faculty practice with patient, that they may not always see the same  provider, and that physicians, fellows, CNM, NP, PA, residents, and medical students may all be involved in their care. Would like PP BTL Accepts flu vaccine today - Culture, OB Urine - GC[facility]Chlamydia probe amp [facility] - CBC/D/Plt+RPR+Rh+ABO+Rub Ab... - Comp Met (CMET) - Protein / creatinine ratio, urine  2. Obesity affecting pregnancy in second trimester   3. H/O cesarean section complicating pregnancy CS for failure to progress followed by VBAC x3  4. Multigravida of advanced maternal age in second trimester   5. Anemia, unspecified type Discussed feraheme given low hgb at last check Accepts  6. Chronic hypertension complicating or reason for care during pregnancy, second trimester   7. Pneumonia due to COVID-19 virus Counseled and would like to get COVID vaccine, she will schedule  Preterm labor symptoms and general obstetric precautions including but not limited to vaginal bleeding, contractions, leaking of fluid and fetal movement were reviewed in detail with the patient. Please refer to After Visit Summary for other counseling recommendations.  Return in 4 weeks (on 11/08/2020).   EcClarnce FlockMD

## 2020-10-11 NOTE — Addendum Note (Signed)
Addended by: Maxwell Marion E on: 10/11/2020 12:23 PM   Modules accepted: Orders

## 2020-10-11 NOTE — Patient Instructions (Signed)
 Second Trimester of Pregnancy The second trimester is from week 14 through week 27 (months 4 through 6). The second trimester is often a time when you feel your best. Your body has adjusted to being pregnant, and you begin to feel better physically. Usually, morning sickness has lessened or quit completely, you may have more energy, and you may have an increase in appetite. The second trimester is also a time when the fetus is growing rapidly. At the end of the sixth month, the fetus is about 9 inches long and weighs about 1 pounds. You will likely begin to feel the baby move (quickening) between 16 and 20 weeks of pregnancy. Body changes during your second trimester Your body continues to go through many changes during your second trimester. The changes vary from woman to woman.  Your weight will continue to increase. You will notice your lower abdomen bulging out.  You may begin to get stretch marks on your hips, abdomen, and breasts.  You may develop headaches that can be relieved by medicines. The medicines should be approved by your health care provider.  You may urinate more often because the fetus is pressing on your bladder.  You may develop or continue to have heartburn as a result of your pregnancy.  You may develop constipation because certain hormones are causing the muscles that push waste through your intestines to slow down.  You may develop hemorrhoids or swollen, bulging veins (varicose veins).  You may have back pain. This is caused by: ? Weight gain. ? Pregnancy hormones that are relaxing the joints in your pelvis. ? A shift in weight and the muscles that support your balance.  Your breasts will continue to grow and they will continue to become tender.  Your gums may bleed and may be sensitive to brushing and flossing.  Dark spots or blotches (chloasma, mask of pregnancy) may develop on your face. This will likely fade after the baby is born.  A dark line from  your belly button to the pubic area (linea nigra) may appear. This will likely fade after the baby is born.  You may have changes in your hair. These can include thickening of your hair, rapid growth, and changes in texture. Some women also have hair loss during or after pregnancy, or hair that feels dry or thin. Your hair will most likely return to normal after your baby is born. What to expect at prenatal visits During a routine prenatal visit:  You will be weighed to make sure you and the fetus are growing normally.  Your blood pressure will be taken.  Your abdomen will be measured to track your baby's growth.  The fetal heartbeat will be listened to.  Any test results from the previous visit will be discussed. Your health care provider may ask you:  How you are feeling.  If you are feeling the baby move.  If you have had any abnormal symptoms, such as leaking fluid, bleeding, severe headaches, or abdominal cramping.  If you are using any tobacco products, including cigarettes, chewing tobacco, and electronic cigarettes.  If you have any questions. Other tests that may be performed during your second trimester include:  Blood tests that check for: ? Low iron levels (anemia). ? High blood sugar that affects pregnant women (gestational diabetes) between 24 and 28 weeks. ? Rh antibodies. This is to check for a protein on red blood cells (Rh factor).  Urine tests to check for infections, diabetes, or protein in   the urine.  An ultrasound to confirm the proper growth and development of the baby.  An amniocentesis to check for possible genetic problems.  Fetal screens for spina bifida and Down syndrome.  HIV (human immunodeficiency virus) testing. Routine prenatal testing includes screening for HIV, unless you choose not to have this test. Follow these instructions at home: Medicines  Follow your health care provider's instructions regarding medicine use. Specific medicines  may be either safe or unsafe to take during pregnancy.  Take a prenatal vitamin that contains at least 600 micrograms (mcg) of folic acid.  If you develop constipation, try taking a stool softener if your health care provider approves. Eating and drinking   Eat a balanced diet that includes fresh fruits and vegetables, whole grains, good sources of protein such as meat, eggs, or tofu, and low-fat dairy. Your health care provider will help you determine the amount of weight gain that is right for you.  Avoid raw meat and uncooked cheese. These carry germs that can cause birth defects in the baby.  If you have low calcium intake from food, talk to your health care provider about whether you should take a daily calcium supplement.  Limit foods that are high in fat and processed sugars, such as fried and sweet foods.  To prevent constipation: ? Drink enough fluid to keep your urine clear or pale yellow. ? Eat foods that are high in fiber, such as fresh fruits and vegetables, whole grains, and beans. Activity  Exercise only as directed by your health care provider. Most women can continue their usual exercise routine during pregnancy. Try to exercise for 30 minutes at least 5 days a week. Stop exercising if you experience uterine contractions.  Avoid heavy lifting, wear low heel shoes, and practice good posture.  A sexual relationship may be continued unless your health care provider directs you otherwise. Relieving pain and discomfort  Wear a good support bra to prevent discomfort from breast tenderness.  Take warm sitz baths to soothe any pain or discomfort caused by hemorrhoids. Use hemorrhoid cream if your health care provider approves.  Rest with your legs elevated if you have leg cramps or low back pain.  If you develop varicose veins, wear support hose. Elevate your feet for 15 minutes, 3-4 times a day. Limit salt in your diet. Prenatal Care  Write down your questions. Take  them to your prenatal visits.  Keep all your prenatal visits as told by your health care provider. This is important. Safety  Wear your seat belt at all times when driving.  Make a list of emergency phone numbers, including numbers for family, friends, the hospital, and police and fire departments. General instructions  Ask your health care provider for a referral to a local prenatal education class. Begin classes no later than the beginning of month 6 of your pregnancy.  Ask for help if you have counseling or nutritional needs during pregnancy. Your health care provider can offer advice or refer you to specialists for help with various needs.  Do not use hot tubs, steam rooms, or saunas.  Do not douche or use tampons or scented sanitary pads.  Do not cross your legs for long periods of time.  Avoid cat litter boxes and soil used by cats. These carry germs that can cause birth defects in the baby and possibly loss of the fetus by miscarriage or stillbirth.  Avoid all smoking, herbs, alcohol, and unprescribed drugs. Chemicals in these products can affect the   formation and growth of the baby.  Do not use any products that contain nicotine or tobacco, such as cigarettes and e-cigarettes. If you need help quitting, ask your health care provider.  Visit your dentist if you have not gone yet during your pregnancy. Use a soft toothbrush to brush your teeth and be gentle when you floss. Contact a health care provider if:  You have dizziness.  You have mild pelvic cramps, pelvic pressure, or nagging pain in the abdominal area.  You have persistent nausea, vomiting, or diarrhea.  You have a bad smelling vaginal discharge.  You have pain when you urinate. Get help right away if:  You have a fever.  You are leaking fluid from your vagina.  You have spotting or bleeding from your vagina.  You have severe abdominal cramping or pain.  You have rapid weight gain or weight loss.  You  have shortness of breath with chest pain.  You notice sudden or extreme swelling of your face, hands, ankles, feet, or legs.  You have not felt your baby move in over an hour.  You have severe headaches that do not go away when you take medicine.  You have vision changes. Summary  The second trimester is from week 14 through week 27 (months 4 through 6). It is also a time when the fetus is growing rapidly.  Your body goes through many changes during pregnancy. The changes vary from woman to woman.  Avoid all smoking, herbs, alcohol, and unprescribed drugs. These chemicals affect the formation and growth your baby.  Do not use any tobacco products, such as cigarettes, chewing tobacco, and e-cigarettes. If you need help quitting, ask your health care provider.  Contact your health care provider if you have any questions. Keep all prenatal visits as told by your health care provider. This is important. This information is not intended to replace advice given to you by your health care provider. Make sure you discuss any questions you have with your health care provider. Document Revised: 01/23/2019 Document Reviewed: 11/06/2016 Elsevier Patient Education  2020 [employer].   Breastfeeding  Choosing to breastfeed is one of the best decisions you can make for yourself and your baby. A change in hormones during pregnancy causes your breasts to make breast milk in your milk-producing glands. Hormones prevent breast milk from being released before your baby is born. They also prompt milk flow after birth. Once breastfeeding has begun, thoughts of your baby, as well as his or her sucking or crying, can stimulate the release of milk from your milk-producing glands. Benefits of breastfeeding Research shows that breastfeeding offers many health benefits for infants and mothers. It also offers a cost-free and convenient way to feed your baby. For your baby  Your first milk (colostrum) helps your  baby's digestive system to function better.  Special cells in your milk (antibodies) help your baby to fight off infections.  Breastfed babies are less likely to develop asthma, allergies, obesity, or type 2 diabetes. They are also at lower risk for sudden infant death syndrome (SIDS).  Nutrients in breast milk are better able to meet your baby's needs compared to infant formula.  Breast milk improves your baby's brain development. For you  Breastfeeding helps to create a very special bond between you and your baby.  Breastfeeding is convenient. Breast milk costs nothing and is always available at the correct temperature.  Breastfeeding helps to burn calories. It helps you to lose the weight that you  gained during pregnancy.  Breastfeeding makes your uterus return faster to its size before pregnancy. It also slows bleeding (lochia) after you give birth.  Breastfeeding helps to lower your risk of developing type 2 diabetes, osteoporosis, rheumatoid arthritis, cardiovascular disease, and breast, ovarian, uterine, and endometrial cancer later in life. Breastfeeding basics Starting breastfeeding  Find a comfortable place to sit or lie down, with your neck and back well-supported.  Place a pillow or a rolled-up blanket under your baby to bring him or her to the level of your breast (if you are seated). Nursing pillows are specially designed to help support your arms and your baby while you breastfeed.  Make sure that your baby's tummy (abdomen) is facing your abdomen.  Gently massage your breast. With your fingertips, massage from the outer edges of your breast inward toward the nipple. This encourages milk flow. If your milk flows slowly, you may need to continue this action during the feeding.  Support your breast with 4 fingers underneath and your thumb above your nipple (make the letter "C" with your hand). Make sure your fingers are well away from your nipple and your baby's  mouth.  Stroke your baby's lips gently with your finger or nipple.  When your baby's mouth is open wide enough, quickly bring your baby to your breast, placing your entire nipple and as much of the areola as possible into your baby's mouth. The areola is the colored area around your nipple. ? More areola should be visible above your baby's upper lip than below the lower lip. ? Your baby's lips should be opened and extended outward (flanged) to ensure an adequate, comfortable latch. ? Your baby's tongue should be between his or her lower gum and your breast.  Make sure that your baby's mouth is correctly positioned around your nipple (latched). Your baby's lips should create a seal on your breast and be turned out (everted).  It is common for your baby to suck about 2-3 minutes in order to start the flow of breast milk. Latching Teaching your baby how to latch onto your breast properly is very important. An improper latch can cause nipple pain, decreased milk supply, and poor weight gain in your baby. Also, if your baby is not latched onto your nipple properly, he or she may swallow some air during feeding. This can make your baby fussy. Burping your baby when you switch breasts during the feeding can help to get rid of the air. However, teaching your baby to latch on properly is still the best way to prevent fussiness from swallowing air while breastfeeding. Signs that your baby has successfully latched onto your nipple  Silent tugging or silent sucking, without causing you pain. Infant's lips should be extended outward (flanged).  Swallowing heard between every 3-4 sucks once your milk has started to flow (after your let-down milk reflex occurs).  Muscle movement above and in front of his or her ears while sucking. Signs that your baby has not successfully latched onto your nipple  Sucking sounds or smacking sounds from your baby while breastfeeding.  Nipple pain. If you think your baby  has not latched on correctly, slip your finger into the corner of your baby's mouth to break the suction and place it between your baby's gums. Attempt to start breastfeeding again. Signs of successful breastfeeding Signs from your baby  Your baby will gradually decrease the number of sucks or will completely stop sucking.  Your baby will fall asleep.  Your  baby's body will relax.  Your baby will retain a small amount of milk in his or her mouth.  Your baby will let go of your breast by himself or herself. Signs from you  Breasts that have increased in firmness, weight, and size 1-3 hours after feeding.  Breasts that are softer immediately after breastfeeding.  Increased milk volume, as well as a change in milk consistency and color by the fifth day of breastfeeding.  Nipples that are not sore, cracked, or bleeding. Signs that your baby is getting enough milk  Wetting at least 1-2 diapers during the first 24 hours after birth.  Wetting at least 5-6 diapers every 24 hours for the first week after birth. The urine should be clear or pale yellow by the age of 5 days.  Wetting 6-8 diapers every 24 hours as your baby continues to grow and develop.  At least 3 stools in a 24-hour period by the age of 5 days. The stool should be soft and yellow.  At least 3 stools in a 24-hour period by the age of 7 days. The stool should be seedy and yellow.  No loss of weight greater than 10% of birth weight during the first 3 days of life.  Average weight gain of 4-7 oz (113-198 g) per week after the age of 4 days.  Consistent daily weight gain by the age of 5 days, without weight loss after the age of 2 weeks. After a feeding, your baby may spit up a small amount of milk. This is normal. Breastfeeding frequency and duration Frequent feeding will help you make more milk and can prevent sore nipples and extremely full breasts (breast engorgement). Breastfeed when you feel the need to reduce the  fullness of your breasts or when your baby shows signs of hunger. This is called "breastfeeding on demand." Signs that your baby is hungry include:  Increased alertness, activity, or restlessness.  Movement of the head from side to side.  Opening of the mouth when the corner of the mouth or cheek is stroked (rooting).  Increased sucking sounds, smacking lips, cooing, sighing, or squeaking.  Hand-to-mouth movements and sucking on fingers or hands.  Fussing or crying. Avoid introducing a pacifier to your baby in the first 4-6 weeks after your baby is born. After this time, you may choose to use a pacifier. Research has shown that pacifier use during the first year of a baby's life decreases the risk of sudden infant death syndrome (SIDS). Allow your baby to feed on each breast as long as he or she wants. When your baby unlatches or falls asleep while feeding from the first breast, offer the second breast. Because newborns are often sleepy in the first few weeks of life, you may need to awaken your baby to get him or her to feed. Breastfeeding times will vary from baby to baby. However, the following rules can serve as a guide to help you make sure that your baby is properly fed:  Newborns (babies 56 weeks of age or younger) may breastfeed every 1-3 hours.  Newborns should not go without breastfeeding for longer than 3 hours during the day or 5 hours during the night.  You should breastfeed your baby a minimum of 8 times in a 24-hour period. Breast milk pumping     Pumping and storing breast milk allows you to make sure that your baby is exclusively fed your breast milk, even at times when you are unable to breastfeed. This  is especially important if you go back to work while you are still breastfeeding, or if you are not able to be present during feedings. Your lactation consultant can help you find a method of pumping that works best for you and give you guidelines about how long it is safe  to store breast milk. Caring for your breasts while you breastfeed Nipples can become dry, cracked, and sore while breastfeeding. The following recommendations can help keep your breasts moisturized and healthy:  Avoid using soap on your nipples.  Wear a supportive bra designed especially for nursing. Avoid wearing underwire-style bras or extremely tight bras (sports bras).  Air-dry your nipples for 3-4 minutes after each feeding.  Use only cotton bra pads to absorb leaked breast milk. Leaking of breast milk between feedings is normal.  Use lanolin on your nipples after breastfeeding. Lanolin helps to maintain your skin's normal moisture barrier. Pure lanolin is not harmful (not toxic) to your baby. You may also hand express a few drops of breast milk and gently massage that milk into your nipples and allow the milk to air-dry. In the first few weeks after giving birth, some women experience breast engorgement. Engorgement can make your breasts feel heavy, warm, and tender to the touch. Engorgement peaks within 3-5 days after you give birth. The following recommendations can help to ease engorgement:  Completely empty your breasts while breastfeeding or pumping. You may want to start by applying warm, moist heat (in the shower or with warm, water-soaked hand towels) just before feeding or pumping. This increases circulation and helps the milk flow. If your baby does not completely empty your breasts while breastfeeding, pump any extra milk after he or she is finished.  Apply ice packs to your breasts immediately after breastfeeding or pumping, unless this is too uncomfortable for you. To do this: ? Put ice in a plastic bag. ? Place a towel between your skin and the bag. ? Leave the ice on for 20 minutes, 2-3 times a day.  Make sure that your baby is latched on and positioned properly while breastfeeding. If engorgement persists after 48 hours of following these recommendations, contact your  health care provider or a Science writer. Overall health care recommendations while breastfeeding  Eat 3 healthy meals and 3 snacks every day. Well-nourished mothers who are breastfeeding need an additional 450-500 calories a day. You can meet this requirement by increasing the amount of a balanced diet that you eat.  Drink enough water to keep your urine pale yellow or clear.  Rest often, relax, and continue to take your prenatal vitamins to prevent fatigue, stress, and low vitamin and mineral levels in your body (nutrient deficiencies).  Do not use any products that contain nicotine or tobacco, such as cigarettes and e-cigarettes. Your baby may be harmed by chemicals from cigarettes that pass into breast milk and exposure to secondhand smoke. If you need help quitting, ask your health care provider.  Avoid alcohol.  Do not use illegal drugs or marijuana.  Talk with your health care provider before taking any medicines. These include over-the-counter and prescription medicines as well as vitamins and herbal supplements. Some medicines that may be harmful to your baby can pass through breast milk.  It is possible to become pregnant while breastfeeding. If birth control is desired, ask your health care provider about options that will be safe while breastfeeding your baby. Where to find more information: [employer]: www.llli.org Contact a health care provider  if:  You feel like you want to stop breastfeeding or have become frustrated with breastfeeding.  Your nipples are cracked or bleeding.  Your breasts are red, tender, or warm.  You have: ? Painful breasts or nipples. ? A swollen area on either breast. ? A fever or chills. ? Nausea or vomiting. ? Drainage other than breast milk from your nipples.  Your breasts do not become full before feedings by the fifth day after you give birth.  You feel sad and depressed.  Your baby is: ? Too sleepy to eat  well. ? Having trouble sleeping. ? More than 55 week old and wetting fewer than 6 diapers in a 24-hour period. ? Not gaining weight by 2 days of age.  Your baby has fewer than 3 stools in a 24-hour period.  Your baby's skin or the white parts of his or her eyes become yellow. Get help right away if:  Your baby is overly tired (lethargic) and does not want to wake up and feed.  Your baby develops an unexplained fever. Summary  Breastfeeding offers many health benefits for infant and mothers.  Try to breastfeed your infant when he or she shows early signs of hunger.  Gently tickle or stroke your baby's lips with your finger or nipple to allow the baby to open his or her mouth. Bring the baby to your breast. Make sure that much of the areola is in your baby's mouth. Offer one side and burp the baby before you offer the other side.  Talk with your health care provider or lactation consultant if you have questions or you face problems as you breastfeed. This information is not intended to replace advice given to you by your health care provider. Make sure you discuss any questions you have with your health care provider. Document Revised: 12/26/2017 Document Reviewed: 11/02/2016 Elsevier Patient Education  [place].

## 2020-10-12 LAB — CBC/D/PLT+RPR+RH+ABO+RUB AB...
Antibody Screen: NEGATIVE
Basophils Absolute: 0 10*3/uL (ref 0.0–0.2)
Basos: 0 %
EOS (ABSOLUTE): 0 10*3/uL (ref 0.0–0.4)
Eos: 0 %
HCV Ab: <0.1 s/co ratio (ref 0.0–0.9)
HIV Screen 4th Generation wRfx: NONREACTIVE
Hematocrit: 26.8 % — ABNORMAL LOW (ref 34.0–46.6)
Hemoglobin: 8.7 g/dL — ABNORMAL LOW (ref 11.1–15.9)
Hepatitis B Surface Ag: NEGATIVE
Immature Grans (Abs): 0 10*3/uL (ref 0.0–0.1)
Immature Granulocytes: 0 %
Lymphocytes Absolute: 1.5 10*3/uL (ref 0.7–3.1)
Lymphs: 23 %
MCH: 22.4 pg — ABNORMAL LOW (ref 26.6–33.0)
MCHC: 32.5 g/dL (ref 31.5–35.7)
MCV: 69 fL — ABNORMAL LOW (ref 79–97)
Monocytes Absolute: 0.6 10*3/uL (ref 0.1–0.9)
Monocytes: 8 %
Neutrophils Absolute: 4.5 10*3/uL (ref 1.4–7.0)
Neutrophils: 69 %
Platelets: 419 10*3/uL (ref 150–450)
RBC: 3.89 x10E6/uL (ref 3.77–5.28)
RDW: 18.7 % — ABNORMAL HIGH (ref 11.7–15.4)
RPR Ser Ql: NONREACTIVE
Rh Factor: POSITIVE
Rubella Antibodies, IGG: 10.7 index (ref >0.99)
WBC: 6.5 10*3/uL (ref 3.4–10.8)

## 2020-10-12 LAB — PROTEIN / CREATININE RATIO, URINE
Creatinine, Urine: 106.8 mg/dL
Protein, Ur: 13.1 mg/dL
Protein/Creat Ratio: 123 mg/g creat (ref 0–200)

## 2020-10-12 LAB — GC/CHLAMYDIA PROBE AMP (~~LOC~~) NOT AT ARMC
Chlamydia: NEGATIVE
Comment: NEGATIVE
Comment: NORMAL
Neisseria Gonorrhea: NEGATIVE

## 2020-10-12 LAB — HCV INTERPRETATION

## 2020-10-13 LAB — URINE CULTURE, OB REFLEX

## 2020-10-13 LAB — CULTURE, OB URINE

## 2020-10-17 ENCOUNTER — Encounter: Payer: Self-pay | Admitting: *Deleted

## 2020-10-21 ENCOUNTER — Other Ambulatory Visit: Payer: Self-pay

## 2020-10-21 ENCOUNTER — Encounter (HOSPITAL_COMMUNITY)
Admission: RE | Admit: 2020-10-21 | Discharge: 2020-10-21 | Disposition: A | Discharge status: Home / Self Care | Payer: Medicaid Other | Source: Ambulatory Visit | Attending: Family Medicine | Admitting: Family Medicine

## 2020-10-21 DIAGNOSIS — O99012 Anemia complicating pregnancy, second trimester: Secondary | ICD-10-CM | POA: Diagnosis present

## 2020-10-21 DIAGNOSIS — Z3A00 Weeks of gestation of pregnancy not specified: Secondary | ICD-10-CM | POA: Insufficient documentation

## 2020-10-21 MED ORDER — SODIUM CHLORIDE 0.9 % IV SOLN
510.0000 mg | Freq: Once | INTRAVENOUS | Status: AC
  Administered 2020-10-21: 510 mg via INTRAVENOUS
  Filled 2020-10-21: qty 510

## 2020-10-21 NOTE — Discharge Instructions (Signed)

## 2020-10-24 ENCOUNTER — Other Ambulatory Visit: Payer: Self-pay | Admitting: Lactation Services

## 2020-10-24 ENCOUNTER — Telehealth: Payer: Self-pay

## 2020-10-24 DIAGNOSIS — O0990 Supervision of high risk pregnancy, unspecified, unspecified trimester: Secondary | ICD-10-CM

## 2020-10-24 MED ORDER — LABETALOL HCL 200 MG PO TABS: 200.0000 mg | ORAL_TABLET | Freq: Two times a day (BID) | ORAL | 3 refills | Status: DC

## 2020-10-24 NOTE — Telephone Encounter (Signed)
Call received from [facility] with notification of elevated BP 164/82. Pt reported headache and shortness of breath.

## 2020-10-24 NOTE — Progress Notes (Signed)
See phone note for patient

## 2020-10-24 NOTE — Telephone Encounter (Addendum)
Returned patients call. She reports she took her BP about 10:15 this morning and took it 4 times in succession. Ranging from 174-146/not sure of bottom number but thinks 80-90's.   She had a headache when BP was elevated and took a nap and headache is gone. The phone call woke her up. She did not take any medication. She has no blurred vision or dizziness. She denies abdominal pain. She had swelling in her feet Saturday and Sunday that is gone today.   Patient denies SOB when on the phone. She reports she did feel like she did have when taking her BP earlier, but not now.  BP currently 143/90. She has a history of Chronic HTN and denies needing to be on BP medication in the past. Will reach out to Physician in the office for plan of care.   Spoke with Dr. Elgie Congo, he would like to start patient on Labetalol 200 mg BID and then bring in for nurse visit for BP labs CBC, Pro/creat ratio, CMP)  and BP check.   Called patient and discussed POC. Advised patient to start taking low dose aspirin daily also. Patient voiced understanding.

## 2020-10-27 ENCOUNTER — Ambulatory Visit (INDEPENDENT_AMBULATORY_CARE_PROVIDER_SITE_OTHER): Payer: Medicaid Other

## 2020-10-27 ENCOUNTER — Other Ambulatory Visit: Payer: Self-pay

## 2020-10-27 ENCOUNTER — Other Ambulatory Visit: Payer: Medicaid Other

## 2020-10-27 VITALS — BP 117/78 | HR 96 | Wt 237.3 lb

## 2020-10-27 DIAGNOSIS — Z0130 Encounter for examination of blood pressure without abnormal findings: Secondary | ICD-10-CM

## 2020-10-27 DIAGNOSIS — O0990 Supervision of high risk pregnancy, unspecified, unspecified trimester: Secondary | ICD-10-CM

## 2020-10-27 NOTE — Progress Notes (Signed)
Patient was assessed and managed by nursing staff during this encounter. I have reviewed the chart and agree with the documentation and plan. I have also made any necessary editorial changes.  Griffin Basil, MD 10/27/2020 11:11 AM

## 2020-10-27 NOTE — Progress Notes (Signed)
Pt here today for BP check s/p start of Labetalol 200 mg po bid.  Pt reports last dose this am.  Pt reports mild headache with no visual disturbances.  BP LA 117/78.  Pt advised to continue to take BP medication as prescribed and to monitor for sx's of HTN.  Pt verbalized understanding.   Mel Almond, RN 10/27/20

## 2020-10-28 LAB — CBC
Hematocrit: 27.6 % — ABNORMAL LOW (ref 34.0–46.6)
Hemoglobin: 8.7 g/dL — ABNORMAL LOW (ref 11.1–15.9)
MCH: 22.8 pg — ABNORMAL LOW (ref 26.6–33.0)
MCHC: 31.5 g/dL (ref 31.5–35.7)
MCV: 72 fL — ABNORMAL LOW (ref 79–97)
Platelets: 360 10*3/uL (ref 150–450)
RBC: 3.81 x10E6/uL (ref 3.77–5.28)
RDW: 21.3 % — ABNORMAL HIGH (ref 11.7–15.4)
WBC: 6.9 10*3/uL (ref 3.4–10.8)

## 2020-10-28 LAB — COMPREHENSIVE METABOLIC PANEL
ALT: 20 IU/L (ref 0–32)
AST: 17 IU/L (ref 0–40)
Albumin/Globulin Ratio: 1.1 — ABNORMAL LOW (ref 1.2–2.2)
Albumin: 3.7 g/dL — ABNORMAL LOW (ref 3.8–4.8)
Alkaline Phosphatase: 95 IU/L (ref 44–121)
BUN/Creatinine Ratio: 13 (ref 9–23)
BUN: 7 mg/dL (ref 6–20)
Bilirubin Total: <0.2 mg/dL (ref 0.0–1.2)
CO2: 18 mmol/L — ABNORMAL LOW (ref 20–29)
Calcium: 9.4 mg/dL (ref 8.7–10.2)
Chloride: 103 mmol/L (ref 96–106)
Creatinine, Ser: 0.53 mg/dL — ABNORMAL LOW (ref 0.57–1.00)
GFR calc Af Amer: 139 mL/min/{1.73_m2} (ref >59)
GFR calc non Af Amer: 121 mL/min/{1.73_m2} (ref >59)
Globulin, Total: 3.4 g/dL (ref 1.5–4.5)
Glucose: 78 mg/dL (ref 65–99)
Potassium: 3.9 mmol/L (ref 3.5–5.2)
Sodium: 134 mmol/L (ref 134–144)
Total Protein: 7.1 g/dL (ref 6.0–8.5)

## 2020-10-28 LAB — PROTEIN / CREATININE RATIO, URINE
Creatinine, Urine: 79.6 mg/dL
Protein, Ur: 10.2 mg/dL
Protein/Creat Ratio: 128 mg/g creat (ref 0–200)

## 2020-11-02 ENCOUNTER — Ambulatory Visit: Payer: Medicaid Other

## 2020-11-07 ENCOUNTER — Ambulatory Visit: Payer: Medicaid Other | Admitting: *Deleted

## 2020-11-07 ENCOUNTER — Ambulatory Visit: Payer: Medicaid Other | Attending: Obstetrics

## 2020-11-07 ENCOUNTER — Encounter: Payer: Self-pay | Admitting: *Deleted

## 2020-11-07 ENCOUNTER — Other Ambulatory Visit: Payer: Self-pay

## 2020-11-07 DIAGNOSIS — O0990 Supervision of high risk pregnancy, unspecified, unspecified trimester: Secondary | ICD-10-CM | POA: Diagnosis present

## 2020-11-07 DIAGNOSIS — O09522 Supervision of elderly multigravida, second trimester: Secondary | ICD-10-CM

## 2020-11-07 DIAGNOSIS — Z8616 Personal history of COVID-19: Secondary | ICD-10-CM

## 2020-11-07 DIAGNOSIS — E669 Obesity, unspecified: Secondary | ICD-10-CM | POA: Diagnosis not present

## 2020-11-07 DIAGNOSIS — O10912 Unspecified pre-existing hypertension complicating pregnancy, second trimester: Secondary | ICD-10-CM | POA: Insufficient documentation

## 2020-11-07 DIAGNOSIS — O99212 Obesity complicating pregnancy, second trimester: Secondary | ICD-10-CM | POA: Diagnosis not present

## 2020-11-07 DIAGNOSIS — Z363 Encounter for antenatal screening for malformations: Secondary | ICD-10-CM

## 2020-11-07 DIAGNOSIS — Z3A24 24 weeks gestation of pregnancy: Secondary | ICD-10-CM

## 2020-11-08 ENCOUNTER — Telehealth (INDEPENDENT_AMBULATORY_CARE_PROVIDER_SITE_OTHER): Payer: Medicaid Other | Admitting: Family Medicine

## 2020-11-08 ENCOUNTER — Other Ambulatory Visit: Payer: Self-pay | Admitting: *Deleted

## 2020-11-08 VITALS — BP 128/82

## 2020-11-08 DIAGNOSIS — E669 Obesity, unspecified: Secondary | ICD-10-CM

## 2020-11-08 DIAGNOSIS — O10912 Unspecified pre-existing hypertension complicating pregnancy, second trimester: Secondary | ICD-10-CM

## 2020-11-08 DIAGNOSIS — O34219 Maternal care for unspecified type scar from previous cesarean delivery: Secondary | ICD-10-CM

## 2020-11-08 DIAGNOSIS — O09522 Supervision of elderly multigravida, second trimester: Secondary | ICD-10-CM

## 2020-11-08 DIAGNOSIS — Z8616 Personal history of COVID-19: Secondary | ICD-10-CM

## 2020-11-08 DIAGNOSIS — Z3A24 24 weeks gestation of pregnancy: Secondary | ICD-10-CM

## 2020-11-08 DIAGNOSIS — O99212 Obesity complicating pregnancy, second trimester: Secondary | ICD-10-CM

## 2020-11-08 DIAGNOSIS — O0990 Supervision of high risk pregnancy, unspecified, unspecified trimester: Secondary | ICD-10-CM

## 2020-11-08 NOTE — Progress Notes (Signed)
I connected with  Meredith Chen on 11/08/20 at 1131 by MyChart and verified that I am speaking with the correct person using two identifiers.   I discussed the limitations, risks, security and privacy concerns of performing an evaluation and management service by telephone and the availability of in person appointments. I also discussed with the patient that there may be a patient responsible charge related to this service. The patient expressed understanding and agreed to proceed.  Annabell Howells, RN 11/08/2020  11:31 AM

## 2020-11-08 NOTE — Progress Notes (Signed)
I connected with Meredith Chen 11/08/20 at 10:55 AM EST by: MyChart video and verified that I am speaking with the correct person using two identifiers.  Patient is located at [facility] and provider is located at [facility].     The purpose of this virtual visit is to provide medical care while limiting exposure to the novel coronavirus. I discussed the limitations, risks, security and privacy concerns of performing an evaluation and management service by MyChart video and the availability of in person appointments. I also discussed with the patient that there may be a patient responsible charge related to this service. By engaging in this virtual visit, you consent to the provision of healthcare.  Additionally, you authorize for your insurance to be billed for the services provided during this visit.  The patient expressed understanding and agreed to proceed.  The following staff members participated in the virtual visit:  Clarnce Flock, MD/MPH    PRENATAL VISIT NOTE  Subjective:  Meredith Chen is a 40 y.o. [id number] at [redacted]w[redacted]d  for phone visit for ongoing prenatal care.  She is currently monitored for the following issues for this high-risk pregnancy and has Anemia; H/O cesarean section complicating pregnancy; Pneumonia due to COVID-19 virus; Chronic hypertension complicating or reason for care during pregnancy, second trimester; Supervision of high risk pregnancy, antepartum; AMA (advanced maternal age) multigravida 35+; History of 2019 novel coronavirus disease (COVID-19); and Obesity affecting pregnancy on their problem list.  Patient reports general discomfort with pregnancy.  Contractions: Not present. Vag. Bleeding: None.  Movement: Present. Denies leaking of fluid.   The following portions of the patient's history were reviewed and updated as appropriate: allergies, current medications, past family history, past medical history, past social history, past surgical history and problem  list.   Objective:   Vitals:   11/08/20 1132  BP: 128/82   Self-Obtained  Fetal Status:     Movement: Present     Assessment and Plan:  Pregnancy: [id number] at [redacted]w[redacted]d 1. Supervision of high risk pregnancy, antepartum BP normal Very tired of her pregnancy, would like early elective IOL, discussed this can be arranged for 39wks when she is closer to her due date Would like a BTL, will sign papers at next visit Advised of 28wk labs with next visit, she will present fasting S/p one dose of feraheme earlier this month, reassess after 28wk labs to see if she needs an additional dose  2. Multigravida of advanced maternal age in second trimester   3. History of 2019 novel coronavirus disease (COVID-19)   4. Obesity affecting pregnancy in second trimester   5. Chronic hypertension complicating or reason for care during pregnancy, second trimester On labetalol 200mg  with good control of BP Following w MFM for antenatal testing  Preterm labor symptoms and general obstetric precautions including but not limited to vaginal bleeding, contractions, leaking of fluid and fetal movement were reviewed in detail with the patient.  No follow-ups on file.  Future Appointments  Date Time Provider [place]  12/12/2020  9:15 AM [facility]     Time spent on virtual visit: 11 minutes  Clarnce Flock, MD

## 2020-12-01 ENCOUNTER — Telehealth: Payer: Self-pay | Admitting: Obstetrics and Gynecology

## 2020-12-01 ENCOUNTER — Other Ambulatory Visit: Payer: Self-pay | Admitting: Obstetrics and Gynecology

## 2020-12-01 NOTE — Progress Notes (Signed)
Received phone call form [facility] with BP alert. Called pt, left VM to present to [facility] for any S/Sx of PEC.

## 2020-12-06 ENCOUNTER — Other Ambulatory Visit: Payer: Self-pay | Admitting: *Deleted

## 2020-12-06 DIAGNOSIS — O0990 Supervision of high risk pregnancy, unspecified, unspecified trimester: Secondary | ICD-10-CM

## 2020-12-07 ENCOUNTER — Other Ambulatory Visit: Payer: Medicaid Other

## 2020-12-07 ENCOUNTER — Encounter: Payer: Self-pay | Admitting: Family Medicine

## 2020-12-07 ENCOUNTER — Other Ambulatory Visit: Payer: Self-pay

## 2020-12-07 ENCOUNTER — Ambulatory Visit (INDEPENDENT_AMBULATORY_CARE_PROVIDER_SITE_OTHER): Payer: Medicaid Other | Admitting: Family Medicine

## 2020-12-07 VITALS — BP 131/81 | HR 109 | Wt 244.3 lb

## 2020-12-07 DIAGNOSIS — J1282 Pneumonia due to coronavirus disease 2019: Secondary | ICD-10-CM

## 2020-12-07 DIAGNOSIS — Z3009 Encounter for other general counseling and advice on contraception: Secondary | ICD-10-CM

## 2020-12-07 DIAGNOSIS — O0990 Supervision of high risk pregnancy, unspecified, unspecified trimester: Secondary | ICD-10-CM

## 2020-12-07 DIAGNOSIS — O34219 Maternal care for unspecified type scar from previous cesarean delivery: Secondary | ICD-10-CM

## 2020-12-07 DIAGNOSIS — D649 Anemia, unspecified: Secondary | ICD-10-CM

## 2020-12-07 DIAGNOSIS — U071 COVID-19: Secondary | ICD-10-CM

## 2020-12-07 DIAGNOSIS — Z23 Encounter for immunization: Secondary | ICD-10-CM | POA: Diagnosis not present

## 2020-12-07 DIAGNOSIS — O09523 Supervision of elderly multigravida, third trimester: Secondary | ICD-10-CM

## 2020-12-07 DIAGNOSIS — O10912 Unspecified pre-existing hypertension complicating pregnancy, second trimester: Secondary | ICD-10-CM

## 2020-12-07 LAB — POCT URINALYSIS DIP (DEVICE)
Bilirubin Urine: NEGATIVE
Glucose, UA: NEGATIVE mg/dL
Ketones, ur: NEGATIVE mg/dL
Nitrite: NEGATIVE
Protein, ur: NEGATIVE mg/dL
Specific Gravity, Urine: >=1.03 (ref 1.005–1.030)
Urobilinogen, UA: 0.2 mg/dL (ref 0.0–1.0)
pH: 6 (ref 5.0–8.0)

## 2020-12-07 NOTE — Telephone Encounter (Signed)
See Note

## 2020-12-07 NOTE — Addendum Note (Signed)
Addended by: Annabell Howells on: 12/07/2020 05:42 PM   Modules accepted: Orders

## 2020-12-07 NOTE — Patient Instructions (Signed)
Contraception Choices Contraception, also called birth control, refers to methods or devices that prevent pregnancy. Hormonal methods Contraceptive implant A contraceptive implant is a thin, plastic tube that contains a hormone that prevents pregnancy. It is different from an intrauterine device (IUD). It is inserted into the upper part of the arm by a health care provider. Implants can be effective for up to 3 years. Progestin-only injections Progestin-only injections are injections of progestin, a synthetic form of the hormone progesterone. They are given every 3 months by a health care provider. Birth control pills Birth control pills are pills that contain hormones that prevent pregnancy. They must be taken once a day, preferably at the same time each day. A prescription is needed to use this method of contraception. Birth control patch The birth control patch contains hormones that prevent pregnancy. It is placed on the skin and must be changed once a week for three weeks and removed on the fourth week. A prescription is needed to use this method of contraception. Vaginal ring A vaginal ring contains hormones that prevent pregnancy. It is placed in the vagina for three weeks and removed on the fourth week. After that, the process is repeated with a new ring. A prescription is needed to use this method of contraception. Emergency contraceptive Emergency contraceptives prevent pregnancy after unprotected sex. They come in pill form and can be taken up to 5 days after sex. They work best the sooner they are taken after having sex. Most emergency contraceptives are available without a prescription. This method should not be used as your only form of birth control.   Barrier methods Female condom A female condom is a thin sheath that is worn over the penis during sex. Condoms keep sperm from going inside a woman's body. They can be used with a sperm-killing substance (spermicide) to increase their  effectiveness. They should be thrown away after one use. Female condom A female condom is a soft, loose-fitting sheath that is put into the vagina before sex. The condom keeps sperm from going inside a woman's body. They should be thrown away after one use. Diaphragm A diaphragm is a soft, dome-shaped barrier. It is inserted into the vagina before sex, along with a spermicide. The diaphragm blocks sperm from entering the uterus, and the spermicide kills sperm. A diaphragm should be left in the vagina for 6-8 hours after sex and removed within 24 hours. A diaphragm is prescribed and fitted by a health care provider. A diaphragm should be replaced every 1-2 years, after giving birth, after gaining more than 15 lb (6.8 kg), and after pelvic surgery. Cervical cap A cervical cap is a round, soft latex or plastic cup that fits over the cervix. It is inserted into the vagina before sex, along with spermicide. It blocks sperm from entering the uterus. The cap should be left in place for 6-8 hours after sex and removed within 48 hours. A cervical cap must be prescribed and fitted by a health care provider. It should be replaced every 2 years. Sponge A sponge is a soft, circular piece of polyurethane foam with spermicide in it. The sponge helps block sperm from entering the uterus, and the spermicide kills sperm. To use it, you make it wet and then insert it into the vagina. It should be inserted before sex, left in for at least 6 hours after sex, and removed and thrown away within 30 hours. Spermicides Spermicides are chemicals that kill or block sperm from entering the  cervix and uterus. They can come as a cream, jelly, suppository, foam, or tablet. A spermicide should be inserted into the vagina with an applicator at least 41-66 minutes before sex to allow time for it to work. The process must be repeated every time you have sex. Spermicides do not require a prescription.   Intrauterine  contraception Intrauterine device (IUD) An IUD is a T-shaped device that is put in a woman's uterus. There are two types:  Hormone IUD.This type contains progestin, a synthetic form of the hormone progesterone. This type can stay in place for 3-5 years.  Copper IUD.This type is wrapped in copper wire. It can stay in place for 10 years. Permanent methods of contraception Female tubal ligation In this method, a woman's fallopian tubes are sealed, tied, or blocked during surgery to prevent eggs from traveling to the uterus. Hysteroscopic sterilization In this method, a small, flexible insert is placed into each fallopian tube. The inserts cause scar tissue to form in the fallopian tubes and block them, so sperm cannot reach an egg. The procedure takes about 3 months to be effective. Another form of birth control must be used during those 3 months. Female sterilization This is a procedure to tie off the tubes that carry sperm (vasectomy). After the procedure, the man can still ejaculate fluid (semen). Another form of birth control must be used for 3 months after the procedure. Natural planning methods Natural family planning In this method, a couple does not have sex on days when the woman could become pregnant. Calendar method In this method, the woman keeps track of the length of each menstrual cycle, identifies the days when pregnancy can happen, and does not have sex on those days. Ovulation method In this method, a couple avoids sex during ovulation. Symptothermal method This method involves not having sex during ovulation. The woman typically checks for ovulation by watching changes in her temperature and in the consistency of cervical mucus. Post-ovulation method In this method, a couple waits to have sex until after ovulation. Where to find more information  Centers for Disease Control and Prevention: http://www.wolf.info/ Summary  Contraception, also called birth control, refers to methods or  devices that prevent pregnancy.  Hormonal methods of contraception include implants, injections, pills, patches, vaginal rings, and emergency contraceptives.  Barrier methods of contraception can include female condoms, female condoms, diaphragms, cervical caps, sponges, and spermicides.  There are two types of IUDs (intrauterine devices). An IUD can be put in a woman's uterus to prevent pregnancy for 3-5 years.  Permanent sterilization can be done through a procedure for males and females. Natural family planning methods involve nothaving sex on days when the woman could become pregnant. This information is not intended to replace advice given to you by your health care provider. Make sure you discuss any questions you have with your health care provider. Document Revised: 03/07/2020 Document Reviewed: 03/07/2020 Elsevier Patient Education  [employer].   Breastfeeding  Choosing to breastfeed is one of the best decisions you can make for yourself and your baby. A change in hormones during pregnancy causes your breasts to make breast milk in your milk-producing glands. Hormones prevent breast milk from being released before your baby is born. They also prompt milk flow after birth. Once breastfeeding has begun, thoughts of your baby, as well as his or her sucking or crying, can stimulate the release of milk from your milk-producing glands. Benefits of breastfeeding Research shows that breastfeeding offers many health benefits  for infants and mothers. It also offers a cost-free and convenient way to feed your baby. For your baby  Your first milk (colostrum) helps your baby's digestive system to function better.  Special cells in your milk (antibodies) help your baby to fight off infections.  Breastfed babies are less likely to develop asthma, allergies, obesity, or type 2 diabetes. They are also at lower risk for sudden infant death syndrome (SIDS).  Nutrients in breast milk are better  able to meet your baby's needs compared to infant formula.  Breast milk improves your baby's brain development. For you  Breastfeeding helps to create a very special bond between you and your baby.  Breastfeeding is convenient. Breast milk costs nothing and is always available at the correct temperature.  Breastfeeding helps to burn calories. It helps you to lose the weight that you gained during pregnancy.  Breastfeeding makes your uterus return faster to its size before pregnancy. It also slows bleeding (lochia) after you give birth.  Breastfeeding helps to lower your risk of developing type 2 diabetes, osteoporosis, rheumatoid arthritis, cardiovascular disease, and breast, ovarian, uterine, and endometrial cancer later in life. Breastfeeding basics Starting breastfeeding  Find a comfortable place to sit or lie down, with your neck and back well-supported.  Place a pillow or a rolled-up blanket under your baby to bring him or her to the level of your breast (if you are seated). Nursing pillows are specially designed to help support your arms and your baby while you breastfeed.  Make sure that your baby's tummy (abdomen) is facing your abdomen.  Gently massage your breast. With your fingertips, massage from the outer edges of your breast inward toward the nipple. This encourages milk flow. If your milk flows slowly, you may need to continue this action during the feeding.  Support your breast with 4 fingers underneath and your thumb above your nipple (make the letter "C" with your hand). Make sure your fingers are well away from your nipple and your baby's mouth.  Stroke your baby's lips gently with your finger or nipple.  When your baby's mouth is open wide enough, quickly bring your baby to your breast, placing your entire nipple and as much of the areola as possible into your baby's mouth. The areola is the colored area around your nipple. ? More areola should be visible above your  baby's upper lip than below the lower lip. ? Your baby's lips should be opened and extended outward (flanged) to ensure an adequate, comfortable latch. ? Your baby's tongue should be between his or her lower gum and your breast.  Make sure that your baby's mouth is correctly positioned around your nipple (latched). Your baby's lips should create a seal on your breast and be turned out (everted).  It is common for your baby to suck about 2-3 minutes in order to start the flow of breast milk. Latching Teaching your baby how to latch onto your breast properly is very important. An improper latch can cause nipple pain, decreased milk supply, and poor weight gain in your baby. Also, if your baby is not latched onto your nipple properly, he or she may swallow some air during feeding. This can make your baby fussy. Burping your baby when you switch breasts during the feeding can help to get rid of the air. However, teaching your baby to latch on properly is still the best way to prevent fussiness from swallowing air while breastfeeding. Signs that your baby has successfully latched onto  your nipple  Silent tugging or silent sucking, without causing you pain. Infant's lips should be extended outward (flanged).  Swallowing heard between every 3-4 sucks once your milk has started to flow (after your let-down milk reflex occurs).  Muscle movement above and in front of his or her ears while sucking. Signs that your baby has not successfully latched onto your nipple  Sucking sounds or smacking sounds from your baby while breastfeeding.  Nipple pain. If you think your baby has not latched on correctly, slip your finger into the corner of your baby's mouth to break the suction and place it between your baby's gums. Attempt to start breastfeeding again. Signs of successful breastfeeding Signs from your baby  Your baby will gradually decrease the number of sucks or will completely stop sucking.  Your baby  will fall asleep.  Your baby's body will relax.  Your baby will retain a small amount of milk in his or her mouth.  Your baby will let go of your breast by himself or herself. Signs from you  Breasts that have increased in firmness, weight, and size 1-3 hours after feeding.  Breasts that are softer immediately after breastfeeding.  Increased milk volume, as well as a change in milk consistency and color by the fifth day of breastfeeding.  Nipples that are not sore, cracked, or bleeding. Signs that your baby is getting enough milk  Wetting at least 1-2 diapers during the first 24 hours after birth.  Wetting at least 5-6 diapers every 24 hours for the first week after birth. The urine should be clear or pale yellow by the age of 5 days.  Wetting 6-8 diapers every 24 hours as your baby continues to grow and develop.  At least 3 stools in a 24-hour period by the age of 5 days. The stool should be soft and yellow.  At least 3 stools in a 24-hour period by the age of 7 days. The stool should be seedy and yellow.  No loss of weight greater than 10% of birth weight during the first 3 days of life.  Average weight gain of 4-7 oz (113-198 g) per week after the age of 4 days.  Consistent daily weight gain by the age of 5 days, without weight loss after the age of 2 weeks. After a feeding, your baby may spit up a small amount of milk. This is normal. Breastfeeding frequency and duration Frequent feeding will help you make more milk and can prevent sore nipples and extremely full breasts (breast engorgement). Breastfeed when you feel the need to reduce the fullness of your breasts or when your baby shows signs of hunger. This is called "breastfeeding on demand." Signs that your baby is hungry include:  Increased alertness, activity, or restlessness.  Movement of the head from side to side.  Opening of the mouth when the corner of the mouth or cheek is stroked (rooting).  Increased  sucking sounds, smacking lips, cooing, sighing, or squeaking.  Hand-to-mouth movements and sucking on fingers or hands.  Fussing or crying. Avoid introducing a pacifier to your baby in the first 4-6 weeks after your baby is born. After this time, you may choose to use a pacifier. Research has shown that pacifier use during the first year of a baby's life decreases the risk of sudden infant death syndrome (SIDS). Allow your baby to feed on each breast as long as he or she wants. When your baby unlatches or falls asleep while feeding from the  first breast, offer the second breast. Because newborns are often sleepy in the first few weeks of life, you may need to awaken your baby to get him or her to feed. Breastfeeding times will vary from baby to baby. However, the following rules can serve as a guide to help you make sure that your baby is properly fed:  Newborns (babies 33 weeks of age or younger) may breastfeed every 1-3 hours.  Newborns should not go without breastfeeding for longer than 3 hours during the day or 5 hours during the night.  You should breastfeed your baby a minimum of 8 times in a 24-hour period. Breast milk pumping Pumping and storing breast milk allows you to make sure that your baby is exclusively fed your breast milk, even at times when you are unable to breastfeed. This is especially important if you go back to work while you are still breastfeeding, or if you are not able to be present during feedings. Your lactation consultant can help you find a method of pumping that works best for you and give you guidelines about how long it is safe to store breast milk.      Caring for your breasts while you breastfeed Nipples can become dry, cracked, and sore while breastfeeding. The following recommendations can help keep your breasts moisturized and healthy:  Avoid using soap on your nipples.  Wear a supportive bra designed especially for nursing. Avoid wearing underwire-style  bras or extremely tight bras (sports bras).  Air-dry your nipples for 3-4 minutes after each feeding.  Use only cotton bra pads to absorb leaked breast milk. Leaking of breast milk between feedings is normal.  Use lanolin on your nipples after breastfeeding. Lanolin helps to maintain your skin's normal moisture barrier. Pure lanolin is not harmful (not toxic) to your baby. You may also hand express a few drops of breast milk and gently massage that milk into your nipples and allow the milk to air-dry. In the first few weeks after giving birth, some women experience breast engorgement. Engorgement can make your breasts feel heavy, warm, and tender to the touch. Engorgement peaks within 3-5 days after you give birth. The following recommendations can help to ease engorgement:  Completely empty your breasts while breastfeeding or pumping. You may want to start by applying warm, moist heat (in the shower or with warm, water-soaked hand towels) just before feeding or pumping. This increases circulation and helps the milk flow. If your baby does not completely empty your breasts while breastfeeding, pump any extra milk after he or she is finished.  Apply ice packs to your breasts immediately after breastfeeding or pumping, unless this is too uncomfortable for you. To do this: ? Put ice in a plastic bag. ? Place a towel between your skin and the bag. ? Leave the ice on for 20 minutes, 2-3 times a day.  Make sure that your baby is latched on and positioned properly while breastfeeding. If engorgement persists after 48 hours of following these recommendations, contact your health care provider or a Science writer. Overall health care recommendations while breastfeeding  Eat 3 healthy meals and 3 snacks every day. Well-nourished mothers who are breastfeeding need an additional 450-500 calories a day. You can meet this requirement by increasing the amount of a balanced diet that you eat.  Drink  enough water to keep your urine pale yellow or clear.  Rest often, relax, and continue to take your prenatal vitamins to prevent fatigue, stress, and low  vitamin and mineral levels in your body (nutrient deficiencies).  Do not use any products that contain nicotine or tobacco, such as cigarettes and e-cigarettes. Your baby may be harmed by chemicals from cigarettes that pass into breast milk and exposure to secondhand smoke. If you need help quitting, ask your health care provider.  Avoid alcohol.  Do not use illegal drugs or marijuana.  Talk with your health care provider before taking any medicines. These include over-the-counter and prescription medicines as well as vitamins and herbal supplements. Some medicines that may be harmful to your baby can pass through breast milk.  It is possible to become pregnant while breastfeeding. If birth control is desired, ask your health care provider about options that will be safe while breastfeeding your baby. Where to find more information: [employer]: www.llli.org Contact a health care provider if:  You feel like you want to stop breastfeeding or have become frustrated with breastfeeding.  Your nipples are cracked or bleeding.  Your breasts are red, tender, or warm.  You have: ? Painful breasts or nipples. ? A swollen area on either breast. ? A fever or chills. ? Nausea or vomiting. ? Drainage other than breast milk from your nipples.  Your breasts do not become full before feedings by the fifth day after you give birth.  You feel sad and depressed.  Your baby is: ? Too sleepy to eat well. ? Having trouble sleeping. ? More than 64 week old and wetting fewer than 6 diapers in a 24-hour period. ? Not gaining weight by 52 days of age.  Your baby has fewer than 3 stools in a 24-hour period.  Your baby's skin or the white parts of his or her eyes become yellow. Get help right away if:  Your baby is overly tired  (lethargic) and does not want to wake up and feed.  Your baby develops an unexplained fever. Summary  Breastfeeding offers many health benefits for infant and mothers.  Try to breastfeed your infant when he or she shows early signs of hunger.  Gently tickle or stroke your baby's lips with your finger or nipple to allow the baby to open his or her mouth. Bring the baby to your breast. Make sure that much of the areola is in your baby's mouth. Offer one side and burp the baby before you offer the other side.  Talk with your health care provider or lactation consultant if you have questions or you face problems as you breastfeed. This information is not intended to replace advice given to you by your health care provider. Make sure you discuss any questions you have with your health care provider. Document Revised: 12/26/2017 Document Reviewed: 11/02/2016 Elsevier Patient Education  2021 [employer].

## 2020-12-07 NOTE — Progress Notes (Signed)
   Subjective:  Meredith Chen is a 40 y.o. [id number] at [redacted]w[redacted]d being seen today for ongoing prenatal care.  She is currently monitored for the following issues for this high-risk pregnancy and has Anemia; H/O cesarean section complicating pregnancy; Pneumonia due to COVID-19 virus; Chronic hypertension complicating or reason for care during pregnancy, second trimester; Supervision of high risk pregnancy, antepartum; AMA (advanced maternal age) multigravida 35+; History of 2019 novel coronavirus disease (COVID-19); Obesity affecting pregnancy; and Unwanted fertility on their problem list.  Patient reports backache.  Contractions: Irritability.  .  Movement: Present. Denies leaking of fluid.   The following portions of the patient's history were reviewed and updated as appropriate: allergies, current medications, past family history, past medical history, past social history, past surgical history and problem list. Problem list updated.   Objective:   Vitals:   12/07/20 0830  BP: 131/81  Pulse: (!) 109  Weight: 244 lb 4.8 oz (110.8 kg)    Fetal Status: Fetal Heart Rate (bpm): 145   Movement: Present     General:  Alert, oriented and cooperative. Patient is in no acute distress.  Skin: Skin is warm and dry. No rash noted.   Cardiovascular: Normal heart rate noted  Respiratory: Normal respiratory effort, no problems with respiration noted  Abdomen: Soft, gravid, appropriate for gestational age. Pain/Pressure: Present     Pelvic:       Cervical exam deferred        Extremities: Normal range of motion.  Edema: Trace  Mental Status: Normal mood and affect. Normal behavior. Normal judgment and thought content.   Urinalysis:      Assessment and Plan:  Pregnancy: [id number] at [redacted]w[redacted]d  1. Supervision of high risk pregnancy, antepartum BP and FHR normal TDaP today Not fasting, will do remainder of 28wk labs next week when she returns for an [place] BTL papers signed today  2. Chronic hypertension  complicating or reason for care during pregnancy, second trimester BP well controlled on labetalol 200mg  BID Following w MFM  3. Pneumonia due to COVID-19 virus Recovered, occurred in 07/2020 Discussed COVID vaccine, she is planning to get it  4. Multigravida of advanced maternal age in third trimester   5. Anemia, unspecified type Recheck CBC today, consider repeat dose of feraheme  6. H/O cesarean section complicating pregnancy S/p VBAC x3  Preterm labor symptoms and general obstetric precautions including but not limited to vaginal bleeding, contractions, leaking of fluid and fetal movement were reviewed in detail with the patient. Please refer to After Visit Summary for other counseling recommendations.  Return in 2 weeks (on 12/21/2020) for [facility], ob visit.   Clarnce Flock, MD

## 2020-12-09 ENCOUNTER — Encounter: Payer: Self-pay | Admitting: *Deleted

## 2020-12-09 ENCOUNTER — Other Ambulatory Visit: Payer: Self-pay | Admitting: *Deleted

## 2020-12-09 DIAGNOSIS — O0990 Supervision of high risk pregnancy, unspecified, unspecified trimester: Secondary | ICD-10-CM

## 2020-12-12 ENCOUNTER — Other Ambulatory Visit: Payer: Self-pay | Admitting: *Deleted

## 2020-12-12 ENCOUNTER — Ambulatory Visit: Payer: Medicaid Other | Admitting: *Deleted

## 2020-12-12 ENCOUNTER — Encounter: Payer: Self-pay | Admitting: *Deleted

## 2020-12-12 ENCOUNTER — Other Ambulatory Visit: Payer: Self-pay

## 2020-12-12 ENCOUNTER — Ambulatory Visit: Payer: Medicaid Other | Attending: Obstetrics

## 2020-12-12 ENCOUNTER — Other Ambulatory Visit: Payer: Medicaid Other

## 2020-12-12 DIAGNOSIS — O10013 Pre-existing essential hypertension complicating pregnancy, third trimester: Secondary | ICD-10-CM | POA: Diagnosis not present

## 2020-12-12 DIAGNOSIS — O0990 Supervision of high risk pregnancy, unspecified, unspecified trimester: Secondary | ICD-10-CM

## 2020-12-12 DIAGNOSIS — O10912 Unspecified pre-existing hypertension complicating pregnancy, second trimester: Secondary | ICD-10-CM | POA: Diagnosis present

## 2020-12-12 DIAGNOSIS — O09523 Supervision of elderly multigravida, third trimester: Secondary | ICD-10-CM | POA: Diagnosis not present

## 2020-12-12 DIAGNOSIS — Z8616 Personal history of COVID-19: Secondary | ICD-10-CM | POA: Insufficient documentation

## 2020-12-12 DIAGNOSIS — E669 Obesity, unspecified: Secondary | ICD-10-CM | POA: Diagnosis not present

## 2020-12-12 DIAGNOSIS — O99213 Obesity complicating pregnancy, third trimester: Secondary | ICD-10-CM | POA: Diagnosis not present

## 2020-12-12 DIAGNOSIS — O10913 Unspecified pre-existing hypertension complicating pregnancy, third trimester: Secondary | ICD-10-CM

## 2020-12-12 DIAGNOSIS — Z3A29 29 weeks gestation of pregnancy: Secondary | ICD-10-CM

## 2020-12-13 ENCOUNTER — Other Ambulatory Visit: Payer: Medicaid Other

## 2020-12-13 ENCOUNTER — Other Ambulatory Visit: Payer: Self-pay

## 2020-12-13 DIAGNOSIS — O0990 Supervision of high risk pregnancy, unspecified, unspecified trimester: Secondary | ICD-10-CM

## 2020-12-14 ENCOUNTER — Telehealth: Payer: Self-pay | Admitting: Family Medicine

## 2020-12-14 ENCOUNTER — Other Ambulatory Visit: Payer: Self-pay | Admitting: Family Medicine

## 2020-12-14 LAB — GLUCOSE TOLERANCE, 2 HOURS W/ 1HR
Glucose, 1 hour: 144 mg/dL (ref 65–179)
Glucose, 2 hour: 135 mg/dL (ref 65–152)
Glucose, Fasting: 86 mg/dL (ref 65–91)

## 2020-12-14 LAB — CBC
Hematocrit: 28.8 % — ABNORMAL LOW (ref 34.0–46.6)
Hemoglobin: 9.2 g/dL — ABNORMAL LOW (ref 11.1–15.9)
MCH: 23.8 pg — ABNORMAL LOW (ref 26.6–33.0)
MCHC: 31.9 g/dL (ref 31.5–35.7)
MCV: 75 fL — ABNORMAL LOW (ref 79–97)
Platelets: 352 10*3/uL (ref 150–450)
RBC: 3.86 x10E6/uL (ref 3.77–5.28)
RDW: 22 % — ABNORMAL HIGH (ref 11.7–15.4)
WBC: 7.1 10*3/uL (ref 3.4–10.8)

## 2020-12-14 LAB — HIV ANTIBODY (ROUTINE TESTING W REFLEX): HIV Screen 4th Generation wRfx: NONREACTIVE

## 2020-12-14 LAB — RPR: RPR Ser Ql: NONREACTIVE

## 2020-12-14 NOTE — Telephone Encounter (Signed)
Patient with low hgb on 28wk labs, she is amenable to repeat dose of feraheme. Will place orders right now, please call and schedule.

## 2020-12-14 NOTE — Telephone Encounter (Signed)
Called short Stay Unit and Scheduled [facility] on 3/8 at 12 noon.   Called patient and informed her of appointment date, time, and location.   Patient voiced understanding. She asked for My Chart message to be sent.

## 2020-12-14 NOTE — Progress Notes (Signed)
Feraheme orders

## 2020-12-15 ENCOUNTER — Encounter: Payer: Self-pay | Admitting: *Deleted

## 2020-12-16 ENCOUNTER — Encounter: Payer: Self-pay | Admitting: *Deleted

## 2020-12-20 ENCOUNTER — Ambulatory Visit (HOSPITAL_COMMUNITY)
Admission: RE | Admit: 2020-12-20 | Discharge: 2020-12-20 | Disposition: A | Discharge status: Home / Self Care | Payer: Medicaid Other | Source: Ambulatory Visit | Attending: Family Medicine | Admitting: Family Medicine

## 2020-12-20 ENCOUNTER — Other Ambulatory Visit: Payer: Self-pay

## 2020-12-20 DIAGNOSIS — D509 Iron deficiency anemia, unspecified: Secondary | ICD-10-CM | POA: Diagnosis not present

## 2020-12-20 DIAGNOSIS — O99019 Anemia complicating pregnancy, unspecified trimester: Secondary | ICD-10-CM | POA: Diagnosis present

## 2020-12-20 MED ORDER — SODIUM CHLORIDE 0.9 % IV SOLN
510.0000 mg | Freq: Once | INTRAVENOUS | Status: AC
  Administered 2020-12-20: 510 mg via INTRAVENOUS
  Filled 2020-12-20: qty 510

## 2020-12-21 ENCOUNTER — Encounter: Payer: Self-pay | Admitting: Family Medicine

## 2020-12-21 ENCOUNTER — Encounter: Payer: Self-pay | Admitting: *Deleted

## 2020-12-21 ENCOUNTER — Ambulatory Visit (INDEPENDENT_AMBULATORY_CARE_PROVIDER_SITE_OTHER): Payer: Medicaid Other | Admitting: Family Medicine

## 2020-12-21 ENCOUNTER — Other Ambulatory Visit: Payer: Self-pay

## 2020-12-21 VITALS — BP 131/78 | HR 94 | Wt 246.2 lb

## 2020-12-21 DIAGNOSIS — O10912 Unspecified pre-existing hypertension complicating pregnancy, second trimester: Secondary | ICD-10-CM

## 2020-12-21 DIAGNOSIS — Z3009 Encounter for other general counseling and advice on contraception: Secondary | ICD-10-CM

## 2020-12-21 DIAGNOSIS — O09529 Supervision of elderly multigravida, unspecified trimester: Secondary | ICD-10-CM

## 2020-12-21 DIAGNOSIS — O34219 Maternal care for unspecified type scar from previous cesarean delivery: Secondary | ICD-10-CM

## 2020-12-21 DIAGNOSIS — Z8616 Personal history of COVID-19: Secondary | ICD-10-CM

## 2020-12-21 DIAGNOSIS — O0990 Supervision of high risk pregnancy, unspecified, unspecified trimester: Secondary | ICD-10-CM

## 2020-12-21 LAB — POCT URINALYSIS DIP (DEVICE)
Bilirubin Urine: NEGATIVE
Glucose, UA: NEGATIVE mg/dL
Hgb urine dipstick: NEGATIVE
Ketones, ur: NEGATIVE mg/dL
Nitrite: NEGATIVE
Protein, ur: NEGATIVE mg/dL
Specific Gravity, Urine: >=1.03 (ref 1.005–1.030)
Urobilinogen, UA: 0.2 mg/dL (ref 0.0–1.0)
pH: 6.5 (ref 5.0–8.0)

## 2020-12-21 NOTE — Progress Notes (Signed)
   Subjective:  Meredith Chen is a 40 y.o. [id number] at [redacted]w[redacted]d being seen today for ongoing prenatal care.  She is currently monitored for the following issues for this high-risk pregnancy and has Anemia; H/O cesarean section complicating pregnancy; Pneumonia due to COVID-19 virus; Chronic hypertension complicating or reason for care during pregnancy, second trimester; Supervision of high risk pregnancy, antepartum; AMA (advanced maternal age) multigravida 35+; History of 2019 novel coronavirus disease (COVID-19); Obesity affecting pregnancy; and Unwanted fertility on their problem list.  Patient reports fatigue.  Contractions: Not present. Vag. Bleeding: None.  Movement: Present. Denies leaking of fluid.   The following portions of the patient's history were reviewed and updated as appropriate: allergies, current medications, past family history, past medical history, past social history, past surgical history and problem list. Problem list updated.  Objective:   Vitals:   12/21/20 1610  BP: 131/78  Pulse: 94  Weight: 246 lb 3.2 oz (111.7 kg)    Fetal Status: Fetal Heart Rate (bpm): 145   Movement: Present     General:  Alert, oriented and cooperative. Patient is in no acute distress.  Skin: Skin is warm and dry. No rash noted.   Cardiovascular: Normal heart rate noted  Respiratory: Normal respiratory effort, no problems with respiration noted  Abdomen: Soft, gravid, appropriate for gestational age. Pain/Pressure: Present     Pelvic: Vag. Bleeding: None     Cervical exam deferred        Extremities: Normal range of motion.  Edema: Trace  Mental Status: Normal mood and affect. Normal behavior. Normal judgment and thought content.   Urinalysis:      Assessment and Plan:  Pregnancy: [id number] at [redacted]w[redacted]d  1. Supervision of high risk pregnancy, antepartum BP and FHR normal  2. Chronic hypertension complicating or reason for care during pregnancy, second trimester On labetalol 200mg  BID,  BP well controlled Following w MFM, last EFW 91% two weeks ago Scheduled for antenatal testing at 32 weeks Plan for IOL at 38 weeks per patient preference  3. Antepartum multigravida of advanced maternal age   73. Unwanted fertility BTL papers already signed  5. History of 2019 novel coronavirus disease (COVID-19) Has not yet gotten COVID vaccine  6. H/O cesarean section complicating pregnancy VBAC x3 Consent signed today  Preterm labor symptoms and general obstetric precautions including but not limited to vaginal bleeding, contractions, leaking of fluid and fetal movement were reviewed in detail with the patient. Please refer to After Visit Summary for other counseling recommendations.  Return in 2 weeks (on 01/04/2021) for [facility], ob visit.   Clarnce Flock, MD

## 2020-12-21 NOTE — Patient Instructions (Signed)
Contraception Choices Contraception, also called birth control, refers to methods or devices that prevent pregnancy. Hormonal methods Contraceptive implant A contraceptive implant is a thin, plastic tube that contains a hormone that prevents pregnancy. It is different from an intrauterine device (IUD). It is inserted into the upper part of the arm by a health care provider. Implants can be effective for up to 3 years. Progestin-only injections Progestin-only injections are injections of progestin, a synthetic form of the hormone progesterone. They are given every 3 months by a health care provider. Birth control pills Birth control pills are pills that contain hormones that prevent pregnancy. They must be taken once a day, preferably at the same time each day. A prescription is needed to use this method of contraception. Birth control patch The birth control patch contains hormones that prevent pregnancy. It is placed on the skin and must be changed once a week for three weeks and removed on the fourth week. A prescription is needed to use this method of contraception. Vaginal ring A vaginal ring contains hormones that prevent pregnancy. It is placed in the vagina for three weeks and removed on the fourth week. After that, the process is repeated with a new ring. A prescription is needed to use this method of contraception. Emergency contraceptive Emergency contraceptives prevent pregnancy after unprotected sex. They come in pill form and can be taken up to 5 days after sex. They work best the sooner they are taken after having sex. Most emergency contraceptives are available without a prescription. This method should not be used as your only form of birth control.   Barrier methods Female condom A female condom is a thin sheath that is worn over the penis during sex. Condoms keep sperm from going inside a woman's body. They can be used with a sperm-killing substance (spermicide) to increase their  effectiveness. They should be thrown away after one use. Female condom A female condom is a soft, loose-fitting sheath that is put into the vagina before sex. The condom keeps sperm from going inside a woman's body. They should be thrown away after one use. Diaphragm A diaphragm is a soft, dome-shaped barrier. It is inserted into the vagina before sex, along with a spermicide. The diaphragm blocks sperm from entering the uterus, and the spermicide kills sperm. A diaphragm should be left in the vagina for 6-8 hours after sex and removed within 24 hours. A diaphragm is prescribed and fitted by a health care provider. A diaphragm should be replaced every 1-2 years, after giving birth, after gaining more than 15 lb (6.8 kg), and after pelvic surgery. Cervical cap A cervical cap is a round, soft latex or plastic cup that fits over the cervix. It is inserted into the vagina before sex, along with spermicide. It blocks sperm from entering the uterus. The cap should be left in place for 6-8 hours after sex and removed within 48 hours. A cervical cap must be prescribed and fitted by a health care provider. It should be replaced every 2 years. Sponge A sponge is a soft, circular piece of polyurethane foam with spermicide in it. The sponge helps block sperm from entering the uterus, and the spermicide kills sperm. To use it, you make it wet and then insert it into the vagina. It should be inserted before sex, left in for at least 6 hours after sex, and removed and thrown away within 30 hours. Spermicides Spermicides are chemicals that kill or block sperm from entering the  cervix and uterus. They can come as a cream, jelly, suppository, foam, or tablet. A spermicide should be inserted into the vagina with an applicator at least 41-66 minutes before sex to allow time for it to work. The process must be repeated every time you have sex. Spermicides do not require a prescription.   Intrauterine  contraception Intrauterine device (IUD) An IUD is a T-shaped device that is put in a woman's uterus. There are two types:  Hormone IUD.This type contains progestin, a synthetic form of the hormone progesterone. This type can stay in place for 3-5 years.  Copper IUD.This type is wrapped in copper wire. It can stay in place for 10 years. Permanent methods of contraception Female tubal ligation In this method, a woman's fallopian tubes are sealed, tied, or blocked during surgery to prevent eggs from traveling to the uterus. Hysteroscopic sterilization In this method, a small, flexible insert is placed into each fallopian tube. The inserts cause scar tissue to form in the fallopian tubes and block them, so sperm cannot reach an egg. The procedure takes about 3 months to be effective. Another form of birth control must be used during those 3 months. Female sterilization This is a procedure to tie off the tubes that carry sperm (vasectomy). After the procedure, the man can still ejaculate fluid (semen). Another form of birth control must be used for 3 months after the procedure. Natural planning methods Natural family planning In this method, a couple does not have sex on days when the woman could become pregnant. Calendar method In this method, the woman keeps track of the length of each menstrual cycle, identifies the days when pregnancy can happen, and does not have sex on those days. Ovulation method In this method, a couple avoids sex during ovulation. Symptothermal method This method involves not having sex during ovulation. The woman typically checks for ovulation by watching changes in her temperature and in the consistency of cervical mucus. Post-ovulation method In this method, a couple waits to have sex until after ovulation. Where to find more information  Centers for Disease Control and Prevention: http://www.wolf.info/ Summary  Contraception, also called birth control, refers to methods or  devices that prevent pregnancy.  Hormonal methods of contraception include implants, injections, pills, patches, vaginal rings, and emergency contraceptives.  Barrier methods of contraception can include female condoms, female condoms, diaphragms, cervical caps, sponges, and spermicides.  There are two types of IUDs (intrauterine devices). An IUD can be put in a woman's uterus to prevent pregnancy for 3-5 years.  Permanent sterilization can be done through a procedure for males and females. Natural family planning methods involve nothaving sex on days when the woman could become pregnant. This information is not intended to replace advice given to you by your health care provider. Make sure you discuss any questions you have with your health care provider. Document Revised: 03/07/2020 Document Reviewed: 03/07/2020 Elsevier Patient Education  [employer].   Breastfeeding  Choosing to breastfeed is one of the best decisions you can make for yourself and your baby. A change in hormones during pregnancy causes your breasts to make breast milk in your milk-producing glands. Hormones prevent breast milk from being released before your baby is born. They also prompt milk flow after birth. Once breastfeeding has begun, thoughts of your baby, as well as his or her sucking or crying, can stimulate the release of milk from your milk-producing glands. Benefits of breastfeeding Research shows that breastfeeding offers many health benefits  for infants and mothers. It also offers a cost-free and convenient way to feed your baby. For your baby  Your first milk (colostrum) helps your baby's digestive system to function better.  Special cells in your milk (antibodies) help your baby to fight off infections.  Breastfed babies are less likely to develop asthma, allergies, obesity, or type 2 diabetes. They are also at lower risk for sudden infant death syndrome (SIDS).  Nutrients in breast milk are better  able to meet your baby's needs compared to infant formula.  Breast milk improves your baby's brain development. For you  Breastfeeding helps to create a very special bond between you and your baby.  Breastfeeding is convenient. Breast milk costs nothing and is always available at the correct temperature.  Breastfeeding helps to burn calories. It helps you to lose the weight that you gained during pregnancy.  Breastfeeding makes your uterus return faster to its size before pregnancy. It also slows bleeding (lochia) after you give birth.  Breastfeeding helps to lower your risk of developing type 2 diabetes, osteoporosis, rheumatoid arthritis, cardiovascular disease, and breast, ovarian, uterine, and endometrial cancer later in life. Breastfeeding basics Starting breastfeeding  Find a comfortable place to sit or lie down, with your neck and back well-supported.  Place a pillow or a rolled-up blanket under your baby to bring him or her to the level of your breast (if you are seated). Nursing pillows are specially designed to help support your arms and your baby while you breastfeed.  Make sure that your baby's tummy (abdomen) is facing your abdomen.  Gently massage your breast. With your fingertips, massage from the outer edges of your breast inward toward the nipple. This encourages milk flow. If your milk flows slowly, you may need to continue this action during the feeding.  Support your breast with 4 fingers underneath and your thumb above your nipple (make the letter "C" with your hand). Make sure your fingers are well away from your nipple and your baby's mouth.  Stroke your baby's lips gently with your finger or nipple.  When your baby's mouth is open wide enough, quickly bring your baby to your breast, placing your entire nipple and as much of the areola as possible into your baby's mouth. The areola is the colored area around your nipple. ? More areola should be visible above your  baby's upper lip than below the lower lip. ? Your baby's lips should be opened and extended outward (flanged) to ensure an adequate, comfortable latch. ? Your baby's tongue should be between his or her lower gum and your breast.  Make sure that your baby's mouth is correctly positioned around your nipple (latched). Your baby's lips should create a seal on your breast and be turned out (everted).  It is common for your baby to suck about 2-3 minutes in order to start the flow of breast milk. Latching Teaching your baby how to latch onto your breast properly is very important. An improper latch can cause nipple pain, decreased milk supply, and poor weight gain in your baby. Also, if your baby is not latched onto your nipple properly, he or she may swallow some air during feeding. This can make your baby fussy. Burping your baby when you switch breasts during the feeding can help to get rid of the air. However, teaching your baby to latch on properly is still the best way to prevent fussiness from swallowing air while breastfeeding. Signs that your baby has successfully latched onto  your nipple  Silent tugging or silent sucking, without causing you pain. Infant's lips should be extended outward (flanged).  Swallowing heard between every 3-4 sucks once your milk has started to flow (after your let-down milk reflex occurs).  Muscle movement above and in front of his or her ears while sucking. Signs that your baby has not successfully latched onto your nipple  Sucking sounds or smacking sounds from your baby while breastfeeding.  Nipple pain. If you think your baby has not latched on correctly, slip your finger into the corner of your baby's mouth to break the suction and place it between your baby's gums. Attempt to start breastfeeding again. Signs of successful breastfeeding Signs from your baby  Your baby will gradually decrease the number of sucks or will completely stop sucking.  Your baby  will fall asleep.  Your baby's body will relax.  Your baby will retain a small amount of milk in his or her mouth.  Your baby will let go of your breast by himself or herself. Signs from you  Breasts that have increased in firmness, weight, and size 1-3 hours after feeding.  Breasts that are softer immediately after breastfeeding.  Increased milk volume, as well as a change in milk consistency and color by the fifth day of breastfeeding.  Nipples that are not sore, cracked, or bleeding. Signs that your baby is getting enough milk  Wetting at least 1-2 diapers during the first 24 hours after birth.  Wetting at least 5-6 diapers every 24 hours for the first week after birth. The urine should be clear or pale yellow by the age of 5 days.  Wetting 6-8 diapers every 24 hours as your baby continues to grow and develop.  At least 3 stools in a 24-hour period by the age of 5 days. The stool should be soft and yellow.  At least 3 stools in a 24-hour period by the age of 7 days. The stool should be seedy and yellow.  No loss of weight greater than 10% of birth weight during the first 3 days of life.  Average weight gain of 4-7 oz (113-198 g) per week after the age of 4 days.  Consistent daily weight gain by the age of 5 days, without weight loss after the age of 2 weeks. After a feeding, your baby may spit up a small amount of milk. This is normal. Breastfeeding frequency and duration Frequent feeding will help you make more milk and can prevent sore nipples and extremely full breasts (breast engorgement). Breastfeed when you feel the need to reduce the fullness of your breasts or when your baby shows signs of hunger. This is called "breastfeeding on demand." Signs that your baby is hungry include:  Increased alertness, activity, or restlessness.  Movement of the head from side to side.  Opening of the mouth when the corner of the mouth or cheek is stroked (rooting).  Increased  sucking sounds, smacking lips, cooing, sighing, or squeaking.  Hand-to-mouth movements and sucking on fingers or hands.  Fussing or crying. Avoid introducing a pacifier to your baby in the first 4-6 weeks after your baby is born. After this time, you may choose to use a pacifier. Research has shown that pacifier use during the first year of a baby's life decreases the risk of sudden infant death syndrome (SIDS). Allow your baby to feed on each breast as long as he or she wants. When your baby unlatches or falls asleep while feeding from the  first breast, offer the second breast. Because newborns are often sleepy in the first few weeks of life, you may need to awaken your baby to get him or her to feed. Breastfeeding times will vary from baby to baby. However, the following rules can serve as a guide to help you make sure that your baby is properly fed:  Newborns (babies 33 weeks of age or younger) may breastfeed every 1-3 hours.  Newborns should not go without breastfeeding for longer than 3 hours during the day or 5 hours during the night.  You should breastfeed your baby a minimum of 8 times in a 24-hour period. Breast milk pumping Pumping and storing breast milk allows you to make sure that your baby is exclusively fed your breast milk, even at times when you are unable to breastfeed. This is especially important if you go back to work while you are still breastfeeding, or if you are not able to be present during feedings. Your lactation consultant can help you find a method of pumping that works best for you and give you guidelines about how long it is safe to store breast milk.      Caring for your breasts while you breastfeed Nipples can become dry, cracked, and sore while breastfeeding. The following recommendations can help keep your breasts moisturized and healthy:  Avoid using soap on your nipples.  Wear a supportive bra designed especially for nursing. Avoid wearing underwire-style  bras or extremely tight bras (sports bras).  Air-dry your nipples for 3-4 minutes after each feeding.  Use only cotton bra pads to absorb leaked breast milk. Leaking of breast milk between feedings is normal.  Use lanolin on your nipples after breastfeeding. Lanolin helps to maintain your skin's normal moisture barrier. Pure lanolin is not harmful (not toxic) to your baby. You may also hand express a few drops of breast milk and gently massage that milk into your nipples and allow the milk to air-dry. In the first few weeks after giving birth, some women experience breast engorgement. Engorgement can make your breasts feel heavy, warm, and tender to the touch. Engorgement peaks within 3-5 days after you give birth. The following recommendations can help to ease engorgement:  Completely empty your breasts while breastfeeding or pumping. You may want to start by applying warm, moist heat (in the shower or with warm, water-soaked hand towels) just before feeding or pumping. This increases circulation and helps the milk flow. If your baby does not completely empty your breasts while breastfeeding, pump any extra milk after he or she is finished.  Apply ice packs to your breasts immediately after breastfeeding or pumping, unless this is too uncomfortable for you. To do this: ? Put ice in a plastic bag. ? Place a towel between your skin and the bag. ? Leave the ice on for 20 minutes, 2-3 times a day.  Make sure that your baby is latched on and positioned properly while breastfeeding. If engorgement persists after 48 hours of following these recommendations, contact your health care provider or a Science writer. Overall health care recommendations while breastfeeding  Eat 3 healthy meals and 3 snacks every day. Well-nourished mothers who are breastfeeding need an additional 450-500 calories a day. You can meet this requirement by increasing the amount of a balanced diet that you eat.  Drink  enough water to keep your urine pale yellow or clear.  Rest often, relax, and continue to take your prenatal vitamins to prevent fatigue, stress, and low  vitamin and mineral levels in your body (nutrient deficiencies).  Do not use any products that contain nicotine or tobacco, such as cigarettes and e-cigarettes. Your baby may be harmed by chemicals from cigarettes that pass into breast milk and exposure to secondhand smoke. If you need help quitting, ask your health care provider.  Avoid alcohol.  Do not use illegal drugs or marijuana.  Talk with your health care provider before taking any medicines. These include over-the-counter and prescription medicines as well as vitamins and herbal supplements. Some medicines that may be harmful to your baby can pass through breast milk.  It is possible to become pregnant while breastfeeding. If birth control is desired, ask your health care provider about options that will be safe while breastfeeding your baby. Where to find more information: [employer]: www.llli.org Contact a health care provider if:  You feel like you want to stop breastfeeding or have become frustrated with breastfeeding.  Your nipples are cracked or bleeding.  Your breasts are red, tender, or warm.  You have: ? Painful breasts or nipples. ? A swollen area on either breast. ? A fever or chills. ? Nausea or vomiting. ? Drainage other than breast milk from your nipples.  Your breasts do not become full before feedings by the fifth day after you give birth.  You feel sad and depressed.  Your baby is: ? Too sleepy to eat well. ? Having trouble sleeping. ? More than 64 week old and wetting fewer than 6 diapers in a 24-hour period. ? Not gaining weight by 52 days of age.  Your baby has fewer than 3 stools in a 24-hour period.  Your baby's skin or the white parts of his or her eyes become yellow. Get help right away if:  Your baby is overly tired  (lethargic) and does not want to wake up and feed.  Your baby develops an unexplained fever. Summary  Breastfeeding offers many health benefits for infant and mothers.  Try to breastfeed your infant when he or she shows early signs of hunger.  Gently tickle or stroke your baby's lips with your finger or nipple to allow the baby to open his or her mouth. Bring the baby to your breast. Make sure that much of the areola is in your baby's mouth. Offer one side and burp the baby before you offer the other side.  Talk with your health care provider or lactation consultant if you have questions or you face problems as you breastfeed. This information is not intended to replace advice given to you by your health care provider. Make sure you discuss any questions you have with your health care provider. Document Revised: 12/26/2017 Document Reviewed: 11/02/2016 Elsevier Patient Education  2021 [employer].

## 2020-12-21 NOTE — Progress Notes (Signed)
Patient stated she has pressure on rib cage that causes a bit of pain and has it everyday. Also stated that she has trouble with vision (has to have things up close to her in order for her to see)  and has headaches that causes dizziness (started last Saturday)

## 2021-01-02 ENCOUNTER — Ambulatory Visit: Payer: Medicaid Other | Attending: Obstetrics and Gynecology

## 2021-01-02 ENCOUNTER — Ambulatory Visit: Payer: Medicaid Other | Admitting: *Deleted

## 2021-01-02 ENCOUNTER — Other Ambulatory Visit: Payer: Self-pay

## 2021-01-02 ENCOUNTER — Encounter: Payer: Self-pay | Admitting: *Deleted

## 2021-01-02 DIAGNOSIS — O0990 Supervision of high risk pregnancy, unspecified, unspecified trimester: Secondary | ICD-10-CM | POA: Insufficient documentation

## 2021-01-02 DIAGNOSIS — O10913 Unspecified pre-existing hypertension complicating pregnancy, third trimester: Secondary | ICD-10-CM | POA: Diagnosis not present

## 2021-01-02 DIAGNOSIS — Z8616 Personal history of COVID-19: Secondary | ICD-10-CM | POA: Diagnosis present

## 2021-01-04 ENCOUNTER — Other Ambulatory Visit: Payer: Self-pay

## 2021-01-04 ENCOUNTER — Encounter: Payer: Self-pay | Admitting: Family Medicine

## 2021-01-04 ENCOUNTER — Ambulatory Visit (INDEPENDENT_AMBULATORY_CARE_PROVIDER_SITE_OTHER): Payer: Medicaid Other | Admitting: Family Medicine

## 2021-01-04 VITALS — BP 139/89 | HR 97 | Wt 249.6 lb

## 2021-01-04 DIAGNOSIS — O0990 Supervision of high risk pregnancy, unspecified, unspecified trimester: Secondary | ICD-10-CM

## 2021-01-04 DIAGNOSIS — O10912 Unspecified pre-existing hypertension complicating pregnancy, second trimester: Secondary | ICD-10-CM

## 2021-01-04 DIAGNOSIS — O99213 Obesity complicating pregnancy, third trimester: Secondary | ICD-10-CM

## 2021-01-04 DIAGNOSIS — D649 Anemia, unspecified: Secondary | ICD-10-CM

## 2021-01-04 DIAGNOSIS — O34219 Maternal care for unspecified type scar from previous cesarean delivery: Secondary | ICD-10-CM

## 2021-01-04 DIAGNOSIS — Z3009 Encounter for other general counseling and advice on contraception: Secondary | ICD-10-CM

## 2021-01-04 DIAGNOSIS — O09523 Supervision of elderly multigravida, third trimester: Secondary | ICD-10-CM

## 2021-01-04 LAB — POCT URINALYSIS DIP (DEVICE)
Bilirubin Urine: NEGATIVE
Glucose, UA: NEGATIVE mg/dL
Ketones, ur: NEGATIVE mg/dL
Nitrite: NEGATIVE
Protein, ur: NEGATIVE mg/dL
Specific Gravity, Urine: >=1.03 (ref 1.005–1.030)
Urobilinogen, UA: 0.2 mg/dL (ref 0.0–1.0)
pH: 7 (ref 5.0–8.0)

## 2021-01-04 MED ORDER — LABETALOL HCL 200 MG PO TABS: 200.0000 mg | ORAL_TABLET | Freq: Two times a day (BID) | ORAL | 5 refills | Status: DC

## 2021-01-04 NOTE — Progress Notes (Signed)
   Subjective:  Meredith Chen is a 40 y.o. [id number] at [redacted]w[redacted]d being seen today for ongoing prenatal care.  She is currently monitored for the following issues for this high-risk pregnancy and has Anemia; H/O cesarean section complicating pregnancy; Pneumonia due to COVID-19 virus; Chronic hypertension complicating or reason for care during pregnancy, second trimester; Supervision of high risk pregnancy, antepartum; AMA (advanced maternal age) multigravida 35+; History of 2019 novel coronavirus disease (COVID-19); Obesity affecting pregnancy; and Unwanted fertility on their problem list.  Patient reports fatigue.  Contractions: Not present. Vag. Bleeding: None.  Movement: Present. Denies leaking of fluid.   The following portions of the patient's history were reviewed and updated as appropriate: allergies, current medications, past family history, past medical history, past social history, past surgical history and problem list. Problem list updated.  Objective:   Vitals:   01/04/21 1512  BP: 139/89  Pulse: 97  Weight: 249 lb 9.6 oz (113.2 kg)    Fetal Status: Fetal Heart Rate (bpm): 148   Movement: Present     General:  Alert, oriented and cooperative. Patient is in no acute distress.  Skin: Skin is warm and dry. No rash noted.   Cardiovascular: Normal heart rate noted  Respiratory: Normal respiratory effort, no problems with respiration noted  Abdomen: Soft, gravid, appropriate for gestational age. Pain/Pressure: Absent     Pelvic: Vag. Bleeding: None     Cervical exam deferred        Extremities: Normal range of motion.  Edema: Trace  Mental Status: Normal mood and affect. Normal behavior. Normal judgment and thought content.   Urinalysis:      Assessment and Plan:  Pregnancy: [id number] at [redacted]w[redacted]d  1. Chronic hypertension complicating or reason for care during pregnancy, second trimester Trouble getting meds from pharmacy, has not taken for several days Still well controlled despite  this Cont labetalol 100mg  BID Following w MFM  2. Supervision of high risk pregnancy, antepartum BP good despite not having access to medications, FHR normal  3. Unwanted fertility Signed BTL papers last visit  4. Obesity affecting pregnancy in third trimester   5. H/O cesarean section complicating pregnancy S/p VBAC x3  6. Multigravida of advanced maternal age in third trimester   7. Anemia, unspecified type Feraheme second dose on 12/20/2020 Recheck CBC at next visit  Preterm labor symptoms and general obstetric precautions including but not limited to vaginal bleeding, contractions, leaking of fluid and fetal movement were reviewed in detail with the patient. Please refer to After Visit Summary for other counseling recommendations.  Return in 2 weeks (on 01/18/2021) for [facility], ob visit, needs MD.   Clarnce Flock, MD

## 2021-01-04 NOTE — Patient Instructions (Signed)
Contraception Choices Contraception, also called birth control, refers to methods or devices that prevent pregnancy. Hormonal methods Contraceptive implant A contraceptive implant is a thin, plastic tube that contains a hormone that prevents pregnancy. It is different from an intrauterine device (IUD). It is inserted into the upper part of the arm by a health care provider. Implants can be effective for up to 3 years. Progestin-only injections Progestin-only injections are injections of progestin, a synthetic form of the hormone progesterone. They are given every 3 months by a health care provider. Birth control pills Birth control pills are pills that contain hormones that prevent pregnancy. They must be taken once a day, preferably at the same time each day. A prescription is needed to use this method of contraception. Birth control patch The birth control patch contains hormones that prevent pregnancy. It is placed on the skin and must be changed once a week for three weeks and removed on the fourth week. A prescription is needed to use this method of contraception. Vaginal ring A vaginal ring contains hormones that prevent pregnancy. It is placed in the vagina for three weeks and removed on the fourth week. After that, the process is repeated with a new ring. A prescription is needed to use this method of contraception. Emergency contraceptive Emergency contraceptives prevent pregnancy after unprotected sex. They come in pill form and can be taken up to 5 days after sex. They work best the sooner they are taken after having sex. Most emergency contraceptives are available without a prescription. This method should not be used as your only form of birth control.   Barrier methods Female condom A female condom is a thin sheath that is worn over the penis during sex. Condoms keep sperm from going inside a woman's body. They can be used with a sperm-killing substance (spermicide) to increase their  effectiveness. They should be thrown away after one use. Female condom A female condom is a soft, loose-fitting sheath that is put into the vagina before sex. The condom keeps sperm from going inside a woman's body. They should be thrown away after one use. Diaphragm A diaphragm is a soft, dome-shaped barrier. It is inserted into the vagina before sex, along with a spermicide. The diaphragm blocks sperm from entering the uterus, and the spermicide kills sperm. A diaphragm should be left in the vagina for 6-8 hours after sex and removed within 24 hours. A diaphragm is prescribed and fitted by a health care provider. A diaphragm should be replaced every 1-2 years, after giving birth, after gaining more than 15 lb (6.8 kg), and after pelvic surgery. Cervical cap A cervical cap is a round, soft latex or plastic cup that fits over the cervix. It is inserted into the vagina before sex, along with spermicide. It blocks sperm from entering the uterus. The cap should be left in place for 6-8 hours after sex and removed within 48 hours. A cervical cap must be prescribed and fitted by a health care provider. It should be replaced every 2 years. Sponge A sponge is a soft, circular piece of polyurethane foam with spermicide in it. The sponge helps block sperm from entering the uterus, and the spermicide kills sperm. To use it, you make it wet and then insert it into the vagina. It should be inserted before sex, left in for at least 6 hours after sex, and removed and thrown away within 30 hours. Spermicides Spermicides are chemicals that kill or block sperm from entering the  cervix and uterus. They can come as a cream, jelly, suppository, foam, or tablet. A spermicide should be inserted into the vagina with an applicator at least 41-66 minutes before sex to allow time for it to work. The process must be repeated every time you have sex. Spermicides do not require a prescription.   Intrauterine  contraception Intrauterine device (IUD) An IUD is a T-shaped device that is put in a woman's uterus. There are two types:  Hormone IUD.This type contains progestin, a synthetic form of the hormone progesterone. This type can stay in place for 3-5 years.  Copper IUD.This type is wrapped in copper wire. It can stay in place for 10 years. Permanent methods of contraception Female tubal ligation In this method, a woman's fallopian tubes are sealed, tied, or blocked during surgery to prevent eggs from traveling to the uterus. Hysteroscopic sterilization In this method, a small, flexible insert is placed into each fallopian tube. The inserts cause scar tissue to form in the fallopian tubes and block them, so sperm cannot reach an egg. The procedure takes about 3 months to be effective. Another form of birth control must be used during those 3 months. Female sterilization This is a procedure to tie off the tubes that carry sperm (vasectomy). After the procedure, the man can still ejaculate fluid (semen). Another form of birth control must be used for 3 months after the procedure. Natural planning methods Natural family planning In this method, a couple does not have sex on days when the woman could become pregnant. Calendar method In this method, the woman keeps track of the length of each menstrual cycle, identifies the days when pregnancy can happen, and does not have sex on those days. Ovulation method In this method, a couple avoids sex during ovulation. Symptothermal method This method involves not having sex during ovulation. The woman typically checks for ovulation by watching changes in her temperature and in the consistency of cervical mucus. Post-ovulation method In this method, a couple waits to have sex until after ovulation. Where to find more information  Centers for Disease Control and Prevention: http://www.wolf.info/ Summary  Contraception, also called birth control, refers to methods or  devices that prevent pregnancy.  Hormonal methods of contraception include implants, injections, pills, patches, vaginal rings, and emergency contraceptives.  Barrier methods of contraception can include female condoms, female condoms, diaphragms, cervical caps, sponges, and spermicides.  There are two types of IUDs (intrauterine devices). An IUD can be put in a woman's uterus to prevent pregnancy for 3-5 years.  Permanent sterilization can be done through a procedure for males and females. Natural family planning methods involve nothaving sex on days when the woman could become pregnant. This information is not intended to replace advice given to you by your health care provider. Make sure you discuss any questions you have with your health care provider. Document Revised: 03/07/2020 Document Reviewed: 03/07/2020 Elsevier Patient Education  [employer].   Breastfeeding  Choosing to breastfeed is one of the best decisions you can make for yourself and your baby. A change in hormones during pregnancy causes your breasts to make breast milk in your milk-producing glands. Hormones prevent breast milk from being released before your baby is born. They also prompt milk flow after birth. Once breastfeeding has begun, thoughts of your baby, as well as his or her sucking or crying, can stimulate the release of milk from your milk-producing glands. Benefits of breastfeeding Research shows that breastfeeding offers many health benefits  for infants and mothers. It also offers a cost-free and convenient way to feed your baby. For your baby  Your first milk (colostrum) helps your baby's digestive system to function better.  Special cells in your milk (antibodies) help your baby to fight off infections.  Breastfed babies are less likely to develop asthma, allergies, obesity, or type 2 diabetes. They are also at lower risk for sudden infant death syndrome (SIDS).  Nutrients in breast milk are better  able to meet your baby's needs compared to infant formula.  Breast milk improves your baby's brain development. For you  Breastfeeding helps to create a very special bond between you and your baby.  Breastfeeding is convenient. Breast milk costs nothing and is always available at the correct temperature.  Breastfeeding helps to burn calories. It helps you to lose the weight that you gained during pregnancy.  Breastfeeding makes your uterus return faster to its size before pregnancy. It also slows bleeding (lochia) after you give birth.  Breastfeeding helps to lower your risk of developing type 2 diabetes, osteoporosis, rheumatoid arthritis, cardiovascular disease, and breast, ovarian, uterine, and endometrial cancer later in life. Breastfeeding basics Starting breastfeeding  Find a comfortable place to sit or lie down, with your neck and back well-supported.  Place a pillow or a rolled-up blanket under your baby to bring him or her to the level of your breast (if you are seated). Nursing pillows are specially designed to help support your arms and your baby while you breastfeed.  Make sure that your baby's tummy (abdomen) is facing your abdomen.  Gently massage your breast. With your fingertips, massage from the outer edges of your breast inward toward the nipple. This encourages milk flow. If your milk flows slowly, you may need to continue this action during the feeding.  Support your breast with 4 fingers underneath and your thumb above your nipple (make the letter "C" with your hand). Make sure your fingers are well away from your nipple and your baby's mouth.  Stroke your baby's lips gently with your finger or nipple.  When your baby's mouth is open wide enough, quickly bring your baby to your breast, placing your entire nipple and as much of the areola as possible into your baby's mouth. The areola is the colored area around your nipple. ? More areola should be visible above your  baby's upper lip than below the lower lip. ? Your baby's lips should be opened and extended outward (flanged) to ensure an adequate, comfortable latch. ? Your baby's tongue should be between his or her lower gum and your breast.  Make sure that your baby's mouth is correctly positioned around your nipple (latched). Your baby's lips should create a seal on your breast and be turned out (everted).  It is common for your baby to suck about 2-3 minutes in order to start the flow of breast milk. Latching Teaching your baby how to latch onto your breast properly is very important. An improper latch can cause nipple pain, decreased milk supply, and poor weight gain in your baby. Also, if your baby is not latched onto your nipple properly, he or she may swallow some air during feeding. This can make your baby fussy. Burping your baby when you switch breasts during the feeding can help to get rid of the air. However, teaching your baby to latch on properly is still the best way to prevent fussiness from swallowing air while breastfeeding. Signs that your baby has successfully latched onto  your nipple  Silent tugging or silent sucking, without causing you pain. Infant's lips should be extended outward (flanged).  Swallowing heard between every 3-4 sucks once your milk has started to flow (after your let-down milk reflex occurs).  Muscle movement above and in front of his or her ears while sucking. Signs that your baby has not successfully latched onto your nipple  Sucking sounds or smacking sounds from your baby while breastfeeding.  Nipple pain. If you think your baby has not latched on correctly, slip your finger into the corner of your baby's mouth to break the suction and place it between your baby's gums. Attempt to start breastfeeding again. Signs of successful breastfeeding Signs from your baby  Your baby will gradually decrease the number of sucks or will completely stop sucking.  Your baby  will fall asleep.  Your baby's body will relax.  Your baby will retain a small amount of milk in his or her mouth.  Your baby will let go of your breast by himself or herself. Signs from you  Breasts that have increased in firmness, weight, and size 1-3 hours after feeding.  Breasts that are softer immediately after breastfeeding.  Increased milk volume, as well as a change in milk consistency and color by the fifth day of breastfeeding.  Nipples that are not sore, cracked, or bleeding. Signs that your baby is getting enough milk  Wetting at least 1-2 diapers during the first 24 hours after birth.  Wetting at least 5-6 diapers every 24 hours for the first week after birth. The urine should be clear or pale yellow by the age of 5 days.  Wetting 6-8 diapers every 24 hours as your baby continues to grow and develop.  At least 3 stools in a 24-hour period by the age of 5 days. The stool should be soft and yellow.  At least 3 stools in a 24-hour period by the age of 7 days. The stool should be seedy and yellow.  No loss of weight greater than 10% of birth weight during the first 3 days of life.  Average weight gain of 4-7 oz (113-198 g) per week after the age of 4 days.  Consistent daily weight gain by the age of 5 days, without weight loss after the age of 2 weeks. After a feeding, your baby may spit up a small amount of milk. This is normal. Breastfeeding frequency and duration Frequent feeding will help you make more milk and can prevent sore nipples and extremely full breasts (breast engorgement). Breastfeed when you feel the need to reduce the fullness of your breasts or when your baby shows signs of hunger. This is called "breastfeeding on demand." Signs that your baby is hungry include:  Increased alertness, activity, or restlessness.  Movement of the head from side to side.  Opening of the mouth when the corner of the mouth or cheek is stroked (rooting).  Increased  sucking sounds, smacking lips, cooing, sighing, or squeaking.  Hand-to-mouth movements and sucking on fingers or hands.  Fussing or crying. Avoid introducing a pacifier to your baby in the first 4-6 weeks after your baby is born. After this time, you may choose to use a pacifier. Research has shown that pacifier use during the first year of a baby's life decreases the risk of sudden infant death syndrome (SIDS). Allow your baby to feed on each breast as long as he or she wants. When your baby unlatches or falls asleep while feeding from the  first breast, offer the second breast. Because newborns are often sleepy in the first few weeks of life, you may need to awaken your baby to get him or her to feed. Breastfeeding times will vary from baby to baby. However, the following rules can serve as a guide to help you make sure that your baby is properly fed:  Newborns (babies 33 weeks of age or younger) may breastfeed every 1-3 hours.  Newborns should not go without breastfeeding for longer than 3 hours during the day or 5 hours during the night.  You should breastfeed your baby a minimum of 8 times in a 24-hour period. Breast milk pumping Pumping and storing breast milk allows you to make sure that your baby is exclusively fed your breast milk, even at times when you are unable to breastfeed. This is especially important if you go back to work while you are still breastfeeding, or if you are not able to be present during feedings. Your lactation consultant can help you find a method of pumping that works best for you and give you guidelines about how long it is safe to store breast milk.      Caring for your breasts while you breastfeed Nipples can become dry, cracked, and sore while breastfeeding. The following recommendations can help keep your breasts moisturized and healthy:  Avoid using soap on your nipples.  Wear a supportive bra designed especially for nursing. Avoid wearing underwire-style  bras or extremely tight bras (sports bras).  Air-dry your nipples for 3-4 minutes after each feeding.  Use only cotton bra pads to absorb leaked breast milk. Leaking of breast milk between feedings is normal.  Use lanolin on your nipples after breastfeeding. Lanolin helps to maintain your skin's normal moisture barrier. Pure lanolin is not harmful (not toxic) to your baby. You may also hand express a few drops of breast milk and gently massage that milk into your nipples and allow the milk to air-dry. In the first few weeks after giving birth, some women experience breast engorgement. Engorgement can make your breasts feel heavy, warm, and tender to the touch. Engorgement peaks within 3-5 days after you give birth. The following recommendations can help to ease engorgement:  Completely empty your breasts while breastfeeding or pumping. You may want to start by applying warm, moist heat (in the shower or with warm, water-soaked hand towels) just before feeding or pumping. This increases circulation and helps the milk flow. If your baby does not completely empty your breasts while breastfeeding, pump any extra milk after he or she is finished.  Apply ice packs to your breasts immediately after breastfeeding or pumping, unless this is too uncomfortable for you. To do this: ? Put ice in a plastic bag. ? Place a towel between your skin and the bag. ? Leave the ice on for 20 minutes, 2-3 times a day.  Make sure that your baby is latched on and positioned properly while breastfeeding. If engorgement persists after 48 hours of following these recommendations, contact your health care provider or a Science writer. Overall health care recommendations while breastfeeding  Eat 3 healthy meals and 3 snacks every day. Well-nourished mothers who are breastfeeding need an additional 450-500 calories a day. You can meet this requirement by increasing the amount of a balanced diet that you eat.  Drink  enough water to keep your urine pale yellow or clear.  Rest often, relax, and continue to take your prenatal vitamins to prevent fatigue, stress, and low  vitamin and mineral levels in your body (nutrient deficiencies).  Do not use any products that contain nicotine or tobacco, such as cigarettes and e-cigarettes. Your baby may be harmed by chemicals from cigarettes that pass into breast milk and exposure to secondhand smoke. If you need help quitting, ask your health care provider.  Avoid alcohol.  Do not use illegal drugs or marijuana.  Talk with your health care provider before taking any medicines. These include over-the-counter and prescription medicines as well as vitamins and herbal supplements. Some medicines that may be harmful to your baby can pass through breast milk.  It is possible to become pregnant while breastfeeding. If birth control is desired, ask your health care provider about options that will be safe while breastfeeding your baby. Where to find more information: [employer]: www.llli.org Contact a health care provider if:  You feel like you want to stop breastfeeding or have become frustrated with breastfeeding.  Your nipples are cracked or bleeding.  Your breasts are red, tender, or warm.  You have: ? Painful breasts or nipples. ? A swollen area on either breast. ? A fever or chills. ? Nausea or vomiting. ? Drainage other than breast milk from your nipples.  Your breasts do not become full before feedings by the fifth day after you give birth.  You feel sad and depressed.  Your baby is: ? Too sleepy to eat well. ? Having trouble sleeping. ? More than 64 week old and wetting fewer than 6 diapers in a 24-hour period. ? Not gaining weight by 52 days of age.  Your baby has fewer than 3 stools in a 24-hour period.  Your baby's skin or the white parts of his or her eyes become yellow. Get help right away if:  Your baby is overly tired  (lethargic) and does not want to wake up and feed.  Your baby develops an unexplained fever. Summary  Breastfeeding offers many health benefits for infant and mothers.  Try to breastfeed your infant when he or she shows early signs of hunger.  Gently tickle or stroke your baby's lips with your finger or nipple to allow the baby to open his or her mouth. Bring the baby to your breast. Make sure that much of the areola is in your baby's mouth. Offer one side and burp the baby before you offer the other side.  Talk with your health care provider or lactation consultant if you have questions or you face problems as you breastfeed. This information is not intended to replace advice given to you by your health care provider. Make sure you discuss any questions you have with your health care provider. Document Revised: 12/26/2017 Document Reviewed: 11/02/2016 Elsevier Patient Education  2021 [employer].

## 2021-01-09 ENCOUNTER — Encounter: Payer: Self-pay | Admitting: *Deleted

## 2021-01-09 ENCOUNTER — Other Ambulatory Visit: Payer: Self-pay | Admitting: *Deleted

## 2021-01-09 ENCOUNTER — Ambulatory Visit: Payer: Medicaid Other | Admitting: *Deleted

## 2021-01-09 ENCOUNTER — Ambulatory Visit: Payer: Medicaid Other | Attending: Obstetrics and Gynecology

## 2021-01-09 ENCOUNTER — Telehealth: Payer: Self-pay | Admitting: Family Medicine

## 2021-01-09 ENCOUNTER — Other Ambulatory Visit: Payer: Self-pay

## 2021-01-09 DIAGNOSIS — O99213 Obesity complicating pregnancy, third trimester: Secondary | ICD-10-CM | POA: Diagnosis not present

## 2021-01-09 DIAGNOSIS — O0990 Supervision of high risk pregnancy, unspecified, unspecified trimester: Secondary | ICD-10-CM

## 2021-01-09 DIAGNOSIS — Z3A33 33 weeks gestation of pregnancy: Secondary | ICD-10-CM

## 2021-01-09 DIAGNOSIS — Z8616 Personal history of COVID-19: Secondary | ICD-10-CM | POA: Diagnosis present

## 2021-01-09 DIAGNOSIS — O10913 Unspecified pre-existing hypertension complicating pregnancy, third trimester: Secondary | ICD-10-CM | POA: Diagnosis not present

## 2021-01-09 DIAGNOSIS — O10013 Pre-existing essential hypertension complicating pregnancy, third trimester: Secondary | ICD-10-CM | POA: Diagnosis not present

## 2021-01-09 DIAGNOSIS — E669 Obesity, unspecified: Secondary | ICD-10-CM

## 2021-01-09 NOTE — Telephone Encounter (Signed)
Spoke with pt. Pt states [facility] does not have Rx of Labetalol that was sent per Dr Dione Plover on 01/04/21. Advised pt I  will call [facility] and return call. Pt agreeable.   Call placed to [place]. Rx was valid and was at [place] location. Pharmacy advised pt to call and transfer Rx to [facility]. Call placed back to pt. Pt given advice from pharmacy. Pt to call and transfer Rx.   Colletta Maryland, RN  01/09/21.

## 2021-01-09 NOTE — Telephone Encounter (Signed)
Patient is requesting a RX for labetalol  [facility] on [address]

## 2021-01-16 ENCOUNTER — Other Ambulatory Visit: Payer: Medicaid Other

## 2021-01-16 ENCOUNTER — Ambulatory Visit: Payer: Medicaid Other

## 2021-01-18 ENCOUNTER — Encounter: Payer: Self-pay | Admitting: Family Medicine

## 2021-01-18 ENCOUNTER — Ambulatory Visit (INDEPENDENT_AMBULATORY_CARE_PROVIDER_SITE_OTHER): Payer: Medicaid Other | Admitting: Family Medicine

## 2021-01-18 ENCOUNTER — Other Ambulatory Visit: Payer: Self-pay

## 2021-01-18 ENCOUNTER — Ambulatory Visit (INDEPENDENT_AMBULATORY_CARE_PROVIDER_SITE_OTHER): Payer: Medicaid Other

## 2021-01-18 ENCOUNTER — Ambulatory Visit: Payer: Medicaid Other | Admitting: *Deleted

## 2021-01-18 VITALS — BP 146/86 | HR 105 | Wt 249.8 lb

## 2021-01-18 DIAGNOSIS — O10919 Unspecified pre-existing hypertension complicating pregnancy, unspecified trimester: Secondary | ICD-10-CM | POA: Diagnosis not present

## 2021-01-18 DIAGNOSIS — O10912 Unspecified pre-existing hypertension complicating pregnancy, second trimester: Secondary | ICD-10-CM

## 2021-01-18 DIAGNOSIS — O0990 Supervision of high risk pregnancy, unspecified, unspecified trimester: Secondary | ICD-10-CM

## 2021-01-18 DIAGNOSIS — Z3009 Encounter for other general counseling and advice on contraception: Secondary | ICD-10-CM

## 2021-01-18 DIAGNOSIS — O34219 Maternal care for unspecified type scar from previous cesarean delivery: Secondary | ICD-10-CM

## 2021-01-18 DIAGNOSIS — O99213 Obesity complicating pregnancy, third trimester: Secondary | ICD-10-CM

## 2021-01-18 DIAGNOSIS — O09523 Supervision of elderly multigravida, third trimester: Secondary | ICD-10-CM

## 2021-01-18 MED ORDER — LABETALOL HCL 300 MG PO TABS: 300.0000 mg | ORAL_TABLET | Freq: Two times a day (BID) | ORAL | 5 refills | Status: DC

## 2021-01-18 NOTE — Progress Notes (Signed)
   Subjective:  Meredith Chen is a 40 y.o. [id number] at [redacted]w[redacted]d being seen today for ongoing prenatal care.  She is currently monitored for the following issues for this high-risk pregnancy and has Anemia; H/O cesarean section complicating pregnancy; Pneumonia due to COVID-19 virus; Chronic hypertension complicating or reason for care during pregnancy, second trimester; Supervision of high risk pregnancy, antepartum; AMA (advanced maternal age) multigravida 35+; History of 2019 novel coronavirus disease (COVID-19); Obesity affecting pregnancy; and Unwanted fertility on their problem list.  Patient reports no complaints.  Contractions: Not present. Vag. Bleeding: None.  Movement: Present. Denies leaking of fluid.   The following portions of the patient's history were reviewed and updated as appropriate: allergies, current medications, past family history, past medical history, past social history, past surgical history and problem list. Problem list updated.  Objective:   Vitals:   01/18/21 1452  BP: (!) 146/86  Pulse: (!) 105  Weight: 249 lb 12.8 oz (113.3 kg)    Fetal Status: Fetal Heart Rate (bpm): NST   Movement: Present     General:  Alert, oriented and cooperative. Patient is in no acute distress.  Skin: Skin is warm and dry. No rash noted.   Cardiovascular: Normal heart rate noted  Respiratory: Normal respiratory effort, no problems with respiration noted  Abdomen: Soft, gravid, appropriate for gestational age. Pain/Pressure: Absent     Pelvic: Vag. Bleeding: None     Cervical exam deferred        Extremities: Normal range of motion.     Mental Status: Normal mood and affect. Normal behavior. Normal judgment and thought content.   Urinalysis:      Assessment and Plan:  Pregnancy: [id number] at [redacted]w[redacted]d  1. Supervision of high risk pregnancy, antepartum BP mildly elevated, see below BPP reassuring, cont weekly until delivery Plan for 38wk IOL (02/13/2021)  2. Multigravida of  advanced maternal age in third trimester   3. Chronic hypertension in pregnancy BP mild range today Per recent CHAPS trial will increase labetalol 200>300mg  BID  4. Unwanted fertility BTL papers previously signed  5. Chronic hypertension complicating or reason for care during pregnancy, second trimester   6. Obesity affecting pregnancy in third trimester   7. H/O cesarean section complicating pregnancy S/p VBAC x3  Preterm labor symptoms and general obstetric precautions including but not limited to vaginal bleeding, contractions, leaking of fluid and fetal movement were reviewed in detail with the patient. Please refer to After Visit Summary for other counseling recommendations.  Return in about 1 week (around 01/25/2021) for weekly HOB and NST/BPP.   Clarnce Flock, MD

## 2021-01-18 NOTE — Progress Notes (Signed)

## 2021-01-18 NOTE — Progress Notes (Signed)
Pt reports back pain on Lt side when walking which radiates down her leg.

## 2021-01-19 ENCOUNTER — Ambulatory Visit: Payer: Medicaid Other

## 2021-01-26 ENCOUNTER — Ambulatory Visit: Payer: Medicaid Other | Admitting: *Deleted

## 2021-01-26 ENCOUNTER — Ambulatory Visit (INDEPENDENT_AMBULATORY_CARE_PROVIDER_SITE_OTHER): Payer: Medicaid Other | Admitting: Obstetrics and Gynecology

## 2021-01-26 ENCOUNTER — Ambulatory Visit: Payer: Medicaid Other

## 2021-01-26 ENCOUNTER — Ambulatory Visit (INDEPENDENT_AMBULATORY_CARE_PROVIDER_SITE_OTHER): Payer: Medicaid Other

## 2021-01-26 ENCOUNTER — Other Ambulatory Visit: Payer: Self-pay

## 2021-01-26 VITALS — BP 129/87 | HR 112 | Wt 248.5 lb

## 2021-01-26 DIAGNOSIS — O10919 Unspecified pre-existing hypertension complicating pregnancy, unspecified trimester: Secondary | ICD-10-CM

## 2021-01-26 DIAGNOSIS — O09523 Supervision of elderly multigravida, third trimester: Secondary | ICD-10-CM

## 2021-01-26 DIAGNOSIS — O34219 Maternal care for unspecified type scar from previous cesarean delivery: Secondary | ICD-10-CM

## 2021-01-26 DIAGNOSIS — O0990 Supervision of high risk pregnancy, unspecified, unspecified trimester: Secondary | ICD-10-CM

## 2021-01-26 DIAGNOSIS — Z3A35 35 weeks gestation of pregnancy: Secondary | ICD-10-CM

## 2021-01-26 DIAGNOSIS — O99213 Obesity complicating pregnancy, third trimester: Secondary | ICD-10-CM

## 2021-01-26 NOTE — Progress Notes (Signed)
   PRENATAL VISIT NOTE  Subjective:  Meredith Chen is a 40 y.o. [id number] at [redacted]w[redacted]d being seen today for ongoing prenatal care.  She is currently monitored for the following issues for this high-risk pregnancy and has Anemia; H/O cesarean section complicating pregnancy; Pneumonia due to COVID-19 virus; Chronic hypertension complicating or reason for care during pregnancy, second trimester; Supervision of high risk pregnancy, antepartum; AMA (advanced maternal age) multigravida 35+; History of 2019 novel coronavirus disease (COVID-19); Obesity affecting pregnancy; Unwanted fertility; and [redacted] weeks gestation of pregnancy on their problem list.  Patient doing well with no acute concerns today. She reports no complaints.  Contractions: Irritability. Vag. Bleeding: None.  Movement: Present. Denies leaking of fluid.   The following portions of the patient's history were reviewed and updated as appropriate: allergies, current medications, past family history, past medical history, past social history, past surgical history and problem list. Problem list updated.  Objective:   Vitals:   01/26/21 1321  BP: 129/87  Pulse: (!) 112  Weight: 248 lb 8 oz (112.7 kg)    Fetal Status: Fetal Heart Rate (bpm): 140 Fundal Height: 35 cm Movement: Present     General:  Alert, oriented and cooperative. Patient is in no acute distress.  Skin: Skin is warm and dry. No rash noted.   Cardiovascular: Normal heart rate noted  Respiratory: Normal respiratory effort, no problems with respiration noted  Abdomen: Soft, gravid, appropriate for gestational age.  Pain/Pressure: Present     Pelvic: Cervical exam deferred        Extremities: Normal range of motion.  Edema: Trace  Mental Status:  Normal mood and affect. Normal behavior. Normal judgment and thought content.   Assessment and Plan:  Pregnancy: [id number] at [redacted]w[redacted]d  1. [redacted] weeks gestation of pregnancy   2. Supervision of high risk pregnancy, antepartum Breech  presentation last BPP, BPP pending for today.  Discussed ECV, repeat c/s and vaginal breech delivery, per pt if she is still breech she desires repeat c section  3. H/O cesarean section complicating pregnancy See above, will discuss again with pt after her BPP  4. Obesity affecting pregnancy in third trimester   5. Multigravida of advanced maternal age in third trimester   Preterm labor symptoms and general obstetric precautions including but not limited to vaginal bleeding, contractions, leaking of fluid and fetal movement were reviewed in detail with the patient.  Please refer to After Visit Summary for other counseling recommendations.   Return in about 1 week (around 02/02/2021) for [facility], in person, 36 weeks swabs, 3rd trim labs.   Lynnda Shields, MD Faculty Attending [facility] for [facility]

## 2021-01-26 NOTE — Patient Instructions (Signed)
Breech Birth A breech birth is when a baby is born with the buttocks or feet first. Most babies are in a head down (vertex) position when they are born. There are three types of breech babies:  When the baby's buttocks are showing first in the vagina (birth canal) with the legs bent at the knees and the feet down near the buttocks (complete breech).  When the baby's buttocks are showing first in the birth canal with the legs straight up and the feet at the baby's head (frank breech).  When one or both of the baby's feet are showing first in the birth canal along with the buttocks (footling breech). What increases the risk of having a breech baby? It is not known what causes your baby to be breech. However, you are more likely to have a breech baby if:  You have had a previous pregnancy.  You are having more than one baby.  Your baby has certain birth (congenital) defects.  You have started your labor earlier than expected (premature labor).  You have problems with your uterus, such as a growths or an abnormally-shaped uterus.  You have too much or not enough fluid surrounding the baby (amniotic fluid).  The placenta covers all or part of the opening of the uterus (placenta previa). How does this affect me? There are no symptoms for you to know that your baby is breech. When you are close to your due date, your health care provider can tell if your baby is breech by doing:  An abdominal or vaginal (pelvic) exam.  An ultrasound. You and your health care provider will discuss the best way to deliver your baby. If your baby is breech, it is less likely that a vaginal delivery will be recommended due to the risks to you and your baby. How does this affect my baby? Having a breech birth increases the health risks to your baby. A breech birth may cause the following:  Umbilical cord prolapse. This is when the umbilical cord enters the birth canal ahead of the baby, before or during labor.  This can cause the cord to become pinched or compressed as labor continues. This can reduce the flow of blood and oxygen to the baby.  The baby getting stuck in the birth canal, which can cause injury or, rarely, death.  Injury to the baby's nerves in the shoulder, arm, and hand (brachial plexus injury) when delivered. How is this treated? Your health care provider may try to turn the baby in your uterus. He or she will use a procedure called external cephalic version (ECV). He or she will place both hands on your abdomen and gently and slowly turn the baby around. It is important to know that ECV can increase your chances of suddenly going into labor. For this reason, an ECV is only done toward the end of a healthy pregnancy. The baby may remain in this position, but sometimes he or she may turn back to the breech position. You and your health care provider will discuss if an ECV is recommended for you and your baby. Your health care provider may recommend that you deliver your baby through a cesarean delivery (C-section). A C-section is the surgical delivery of a baby through an incision in the abdomen and the uterus.   Contact a health care provider if: Get help right away if:  Your baby is breech and you have regular painful contractions or loss of your mucus plug (bloody show).  Your  water breaks.  You see the baby's umbilical cord protruding from your vagina. Summary  A breech birth is when a baby is born with the buttocks or feet first.  Having a breech birth may increase the risks to your baby.  Your health care provider may try to turn your baby in your uterus using a procedure called an external cephalic version (ECV).  If your baby cannot be turned to a head down position or if your baby remains in a breech position, your health care provider will make recommendations about the safest way to deliver your baby. This information is not intended to replace advice given to you by  your health care provider. Make sure you discuss any questions you have with your health care provider. Document Revised: 07/18/2020 Document Reviewed: 07/18/2020 Elsevier Patient Education  2021 [employer].

## 2021-02-01 ENCOUNTER — Other Ambulatory Visit: Payer: Self-pay

## 2021-02-01 ENCOUNTER — Telehealth (HOSPITAL_COMMUNITY): Payer: Self-pay | Admitting: *Deleted

## 2021-02-01 ENCOUNTER — Other Ambulatory Visit (HOSPITAL_COMMUNITY)
Admission: RE | Admit: 2021-02-01 | Discharge: 2021-02-01 | Disposition: A | Discharge status: Home / Self Care | Payer: Medicaid Other | Source: Ambulatory Visit | Attending: Family Medicine | Admitting: Family Medicine

## 2021-02-01 ENCOUNTER — Ambulatory Visit (INDEPENDENT_AMBULATORY_CARE_PROVIDER_SITE_OTHER): Payer: Medicaid Other | Admitting: *Deleted

## 2021-02-01 ENCOUNTER — Ambulatory Visit (INDEPENDENT_AMBULATORY_CARE_PROVIDER_SITE_OTHER): Payer: Medicaid Other

## 2021-02-01 ENCOUNTER — Ambulatory Visit (INDEPENDENT_AMBULATORY_CARE_PROVIDER_SITE_OTHER): Payer: Medicaid Other | Admitting: Family Medicine

## 2021-02-01 VITALS — BP 131/88 | HR 97 | Wt 254.5 lb

## 2021-02-01 DIAGNOSIS — O0990 Supervision of high risk pregnancy, unspecified, unspecified trimester: Secondary | ICD-10-CM | POA: Insufficient documentation

## 2021-02-01 DIAGNOSIS — O34219 Maternal care for unspecified type scar from previous cesarean delivery: Secondary | ICD-10-CM

## 2021-02-01 DIAGNOSIS — O09523 Supervision of elderly multigravida, third trimester: Secondary | ICD-10-CM

## 2021-02-01 DIAGNOSIS — E041 Nontoxic single thyroid nodule: Secondary | ICD-10-CM

## 2021-02-01 DIAGNOSIS — O10919 Unspecified pre-existing hypertension complicating pregnancy, unspecified trimester: Secondary | ICD-10-CM

## 2021-02-01 DIAGNOSIS — D649 Anemia, unspecified: Secondary | ICD-10-CM

## 2021-02-01 LAB — POCT URINALYSIS DIP (DEVICE)
Bilirubin Urine: NEGATIVE
Glucose, UA: NEGATIVE mg/dL
Ketones, ur: NEGATIVE mg/dL
Nitrite: NEGATIVE
Protein, ur: NEGATIVE mg/dL
Specific Gravity, Urine: 1.025 (ref 1.005–1.030)
Urobilinogen, UA: 0.2 mg/dL (ref 0.0–1.0)
pH: 6 (ref 5.0–8.0)

## 2021-02-01 NOTE — Addendum Note (Signed)
Addended by: Annabell Howells on: 02/01/2021 05:28 PM   Modules accepted: Orders

## 2021-02-01 NOTE — Progress Notes (Signed)
   PRENATAL VISIT NOTE  Subjective:  Meredith Chen is a 40 y.o. [id number] at [redacted]w[redacted]d being seen today for ongoing prenatal care.  She is currently monitored for the following issues for this high-risk pregnancy and has Anemia; H/O cesarean section complicating pregnancy; Pneumonia due to COVID-19 virus; Chronic hypertension affecting pregnancy; Supervision of high risk pregnancy, antepartum; AMA (advanced maternal age) multigravida 29+; History of 2019 novel coronavirus disease (COVID-19); Obesity affecting pregnancy; Unwanted fertility; and Thyroid nodule on their problem list.  Patient reports fatigue.  Contractions: Irregular. Vag. Bleeding: None.  Movement: (!) Decreased. Denies leaking of fluid.   The following portions of the patient's history were reviewed and updated as appropriate: allergies, current medications, past family history, past medical history, past social history, past surgical history and problem list.   Objective:   Vitals:   02/01/21 0825  BP: 131/88  Pulse: 97  Weight: 254 lb 8 oz (115.4 kg)    Fetal Status: Fetal Heart Rate (bpm): 140   Movement: (!) Decreased  Presentation: Vertex  General:  Alert, oriented and cooperative. Patient is in no acute distress.  Skin: Skin is warm and dry. No rash noted.   Neck: Thyroid with soft nodule 2 x 2 cm on right  Cardiovascular: Normal heart rate noted  Respiratory: Normal respiratory effort, no problems with respiration noted  Abdomen: Soft, gravid, appropriate for gestational age.  Pain/Pressure: Absent     Pelvic: Cervical exam performed in the presence of a chaperone Dilation: 1.5 Effacement (%): Thick Station: -3  Extremities: Normal range of motion.  Edema: Trace  Mental Status: Normal mood and affect. Normal behavior. Normal judgment and thought content.  NST:  Baseline: 135 bpm, Variability: Good {> 6 bpm), Accelerations: Reactive and Decelerations: Absent   Assessment and Plan:  Pregnancy: R1M2111 at [redacted]w[redacted]d 1.  Supervision of high risk pregnancy, antepartum Cultures today - Culture, beta strep (group b only) - GC/Chlamydia probe amp ([place])not at [facility]  2. H/O cesarean section complicating pregnancy For TOLAC has H.o VBAC x 3 Consent signed  3. Multigravida of advanced maternal age in third trimester Low risk NIPT  4. Chronic hypertension affecting pregnancy BP is well controlled on ASA and Labetalol Fetus last growth at 88% Repeat growth next week Antenatal testing today is reassuring  5. Anemia, unspecified type S/p Feraheme x 2 repeat count - CBC  6. Thyroid nodule 2 x 2 cm, check u/s and TSH - US THYROID; Future - TSH  Preterm labor symptoms and general obstetric precautions including but not limited to vaginal bleeding, contractions, leaking of fluid and fetal movement were reviewed in detail with the patient. Please refer to After Visit Summary for other counseling recommendations.   Return in 1 week (on 02/08/2021).  Future Appointments  Date Time Provider [place]  02/01/2021  9:15 AM [facility] NST [facility]  02/09/2021  1:55 PM Woodroe Mode, MD [facility]  02/09/2021  2:45 PM [facility] NURSE [facility]  02/09/2021  3:00 PM [facility] US1 [facility]  02/15/2021 12:00 AM [facility] SCHED ROOM [facility] None    Donnamae Jude, MD

## 2021-02-01 NOTE — Progress Notes (Addendum)
Called pt with Thyroid US appt information; VM left. Scheduled for 02/10/21 at 1315 and [place] location. MyChart message sent.  Apolonio Schneiders RN 02/01/21

## 2021-02-01 NOTE — Patient Instructions (Signed)

## 2021-02-01 NOTE — Telephone Encounter (Signed)
Preadmission screen  

## 2021-02-02 ENCOUNTER — Encounter (HOSPITAL_COMMUNITY): Payer: Self-pay | Admitting: *Deleted

## 2021-02-02 ENCOUNTER — Telehealth (HOSPITAL_COMMUNITY): Payer: Self-pay | Admitting: *Deleted

## 2021-02-02 LAB — CBC
Hematocrit: 32.2 % — ABNORMAL LOW (ref 34.0–46.6)
Hemoglobin: 10.7 g/dL — ABNORMAL LOW (ref 11.1–15.9)
MCH: 26.7 pg (ref 26.6–33.0)
MCHC: 33.2 g/dL (ref 31.5–35.7)
MCV: 80 fL (ref 79–97)
Platelets: 314 10*3/uL (ref 150–450)
RBC: 4.01 x10E6/uL (ref 3.77–5.28)
RDW: 22.1 % — ABNORMAL HIGH (ref 11.7–15.4)
WBC: 5.4 10*3/uL (ref 3.4–10.8)

## 2021-02-02 LAB — GC/CHLAMYDIA PROBE AMP (~~LOC~~) NOT AT ARMC
Chlamydia: NEGATIVE
Comment: NEGATIVE
Comment: NORMAL
Neisseria Gonorrhea: NEGATIVE

## 2021-02-02 LAB — TSH: TSH: 2.18 u[IU]/mL (ref 0.450–4.500)

## 2021-02-02 NOTE — Telephone Encounter (Signed)
Preadmission screen  

## 2021-02-03 ENCOUNTER — Encounter: Payer: Self-pay | Admitting: *Deleted

## 2021-02-03 ENCOUNTER — Ambulatory Visit: Payer: Medicaid Other

## 2021-02-03 ENCOUNTER — Other Ambulatory Visit: Payer: Self-pay

## 2021-02-05 LAB — CULTURE, BETA STREP (GROUP B ONLY): Strep Gp B Culture: NEGATIVE

## 2021-02-07 ENCOUNTER — Other Ambulatory Visit: Payer: Self-pay | Admitting: Advanced Practice Midwife

## 2021-02-09 ENCOUNTER — Encounter: Payer: Self-pay | Admitting: *Deleted

## 2021-02-09 ENCOUNTER — Other Ambulatory Visit: Payer: Self-pay

## 2021-02-09 ENCOUNTER — Encounter: Payer: Medicaid Other | Admitting: Obstetrics & Gynecology

## 2021-02-09 ENCOUNTER — Ambulatory Visit: Payer: Medicaid Other | Attending: Obstetrics and Gynecology

## 2021-02-09 ENCOUNTER — Ambulatory Visit: Payer: Medicaid Other | Admitting: *Deleted

## 2021-02-09 ENCOUNTER — Encounter: Payer: Self-pay | Admitting: Obstetrics and Gynecology

## 2021-02-09 ENCOUNTER — Ambulatory Visit (INDEPENDENT_AMBULATORY_CARE_PROVIDER_SITE_OTHER): Payer: Medicaid Other | Admitting: Obstetrics and Gynecology

## 2021-02-09 VITALS — BP 122/76 | HR 116

## 2021-02-09 DIAGNOSIS — O09523 Supervision of elderly multigravida, third trimester: Secondary | ICD-10-CM | POA: Diagnosis not present

## 2021-02-09 DIAGNOSIS — Z8616 Personal history of COVID-19: Secondary | ICD-10-CM | POA: Diagnosis present

## 2021-02-09 DIAGNOSIS — O34219 Maternal care for unspecified type scar from previous cesarean delivery: Secondary | ICD-10-CM

## 2021-02-09 DIAGNOSIS — O0990 Supervision of high risk pregnancy, unspecified, unspecified trimester: Secondary | ICD-10-CM

## 2021-02-09 DIAGNOSIS — O99213 Obesity complicating pregnancy, third trimester: Secondary | ICD-10-CM | POA: Insufficient documentation

## 2021-02-09 DIAGNOSIS — O10919 Unspecified pre-existing hypertension complicating pregnancy, unspecified trimester: Secondary | ICD-10-CM | POA: Diagnosis present

## 2021-02-09 DIAGNOSIS — Z3A37 37 weeks gestation of pregnancy: Secondary | ICD-10-CM | POA: Diagnosis not present

## 2021-02-09 DIAGNOSIS — O10913 Unspecified pre-existing hypertension complicating pregnancy, third trimester: Secondary | ICD-10-CM | POA: Diagnosis present

## 2021-02-09 DIAGNOSIS — Z3009 Encounter for other general counseling and advice on contraception: Secondary | ICD-10-CM

## 2021-02-09 DIAGNOSIS — E041 Nontoxic single thyroid nodule: Secondary | ICD-10-CM

## 2021-02-09 DIAGNOSIS — D649 Anemia, unspecified: Secondary | ICD-10-CM

## 2021-02-09 DIAGNOSIS — O10013 Pre-existing essential hypertension complicating pregnancy, third trimester: Secondary | ICD-10-CM

## 2021-02-09 LAB — POCT URINALYSIS DIP (DEVICE)
Glucose, UA: NEGATIVE mg/dL
Hgb urine dipstick: NEGATIVE
Ketones, ur: NEGATIVE mg/dL
Nitrite: NEGATIVE
Protein, ur: 30 mg/dL — AB
Specific Gravity, Urine: >=1.03 (ref 1.005–1.030)
Urobilinogen, UA: 1 mg/dL (ref 0.0–1.0)
pH: 6 (ref 5.0–8.0)

## 2021-02-09 NOTE — Patient Instructions (Signed)

## 2021-02-09 NOTE — Progress Notes (Signed)
Subjective:  Meredith Chen is a 40 y.o. [id number] at [redacted]w[redacted]d being seen today for ongoing prenatal care.  She is currently monitored for the following issues for this high-risk pregnancy and has Anemia; H/O cesarean section complicating pregnancy; Pneumonia due to COVID-19 virus; Chronic hypertension affecting pregnancy; Supervision of high risk pregnancy, antepartum; AMA (advanced maternal age) multigravida 33+; History of 2019 novel coronavirus disease (COVID-19); Obesity affecting pregnancy; Unwanted fertility; and Thyroid nodule on their problem list.  Patient reports general discomforts of pregnancy.  Contractions: Irritability. Vag. Bleeding: None.  Movement: Present. Denies leaking of fluid.   The following portions of the patient's history were reviewed and updated as appropriate: allergies, current medications, past family history, past medical history, past social history, past surgical history and problem list. Problem list updated.  Objective:   Vitals:   02/09/21 1439  BP: 122/76  Pulse: (!) 116    Fetal Status: Fetal Heart Rate (bpm): 152   Movement: Present     General:  Alert, oriented and cooperative. Patient is in no acute distress.  Skin: Skin is warm and dry. No rash noted.   Cardiovascular: Normal heart rate noted  Respiratory: Normal respiratory effort, no problems with respiration noted  Abdomen: Soft, gravid, appropriate for gestational age. Pain/Pressure: Present     Pelvic:  Cervical exam deferred        Extremities: Normal range of motion.  Edema: None  Mental Status: Normal mood and affect. Normal behavior. Normal judgment and thought content.   Urinalysis:      Assessment and Plan:  Pregnancy: [id number] at [redacted]w[redacted]d  1. Supervision of high risk pregnancy, antepartum Labor precautions  2. Multigravida of advanced maternal age in third trimester Negative genetic testing  3. Unwanted fertility BTL papers signed  4. H/O cesarean section complicating  pregnancy Desires VBAC, consented  5. Thyroid nodule U/S tomorrow  6. Anemia, unspecified type Continue with iron supplement  7. CHTN BP stable Growth and BPP today IOL scheduled for next week Term labor symptoms and general obstetric precautions including but not limited to vaginal bleeding, contractions, leaking of fluid and fetal movement were reviewed in detail with the patient. Please refer to After Visit Summary for other counseling recommendations.  No follow-ups on file.   Chancy Milroy, MD

## 2021-02-10 ENCOUNTER — Ambulatory Visit (HOSPITAL_COMMUNITY): Admission: RE | Admit: 2021-02-10 | Payer: Medicaid Other | Source: Ambulatory Visit

## 2021-02-13 ENCOUNTER — Other Ambulatory Visit (HOSPITAL_COMMUNITY)
Admission: RE | Admit: 2021-02-13 | Discharge: 2021-02-13 | Disposition: A | Discharge status: Home / Self Care | Payer: Medicaid Other | Source: Ambulatory Visit | Attending: Obstetrics & Gynecology | Admitting: Obstetrics & Gynecology

## 2021-02-13 DIAGNOSIS — Z01812 Encounter for preprocedural laboratory examination: Secondary | ICD-10-CM | POA: Diagnosis present

## 2021-02-13 DIAGNOSIS — Z20822 Contact with and (suspected) exposure to covid-19: Secondary | ICD-10-CM | POA: Diagnosis not present

## 2021-02-14 ENCOUNTER — Encounter: Payer: Self-pay | Admitting: *Deleted

## 2021-02-14 LAB — SARS CORONAVIRUS 2 (TAT 6-24 HRS): SARS Coronavirus 2: NEGATIVE

## 2021-02-14 NOTE — Progress Notes (Deleted)
OBSTETRIC ADMISSION HISTORY AND PHYSICAL  Meredith Chen is a 40 y.o. female [phone] with IUP at [redacted]w[redacted]d by 6 week [place] presenting for TOLAC in the setting of IOL-CHTN on labetalol. She reports +FMs, No LOF, no VB, no blurry vision, headaches or peripheral edema, and RUQ pain.  She plans on breast feeding. She request BTL for birth control (consent signed 12/07/20). She received her prenatal care at [facility]   Dating: By 6 week [place] --->  Estimated Date of Delivery: 02/27/21  Sono:  @[redacted]w[redacted]d , normal anatomy, cephalic presentation, 57 lie, 3704g, 93% EFW, BPP 8/8   Prenatal History/Complications:  Chronic HTN (labetalol 300 mg BID and BASA)  Chlamydia, adequately treated  Past Medical History: Past Medical History:  Diagnosis Date  . Abnormal Pap smear   . Chlamydia   . COVID-19 07/2020  . Fibroids   . Hypertension    during this pregnancy   . Medical history non-contributory   . Pneumonia due to COVID-19 virus 07/2020  . Trichomonas    January 2014  . Vaginal Pap smear, abnormal     Past Surgical History: Past Surgical History:  Procedure Laterality Date  . CESAREAN SECTION      Obstetrical History: OB History    Gravida  6   Para  4   Term  4   Preterm      AB  1   Living  5     SAB      IAB  1   Ectopic      Multiple  1   Live Births  5           Social History Social History   Socioeconomic History  . Marital status: Single    Spouse name: Not on file  . Number of children: Not on file  . Years of education: Not on file  . Highest education level: Not on file  Occupational History  . Not on file  Tobacco Use  . Smoking status: Never Smoker  . Smokeless tobacco: Never Used  Vaping Use  . Vaping Use: Never used  Substance and Sexual Activity  . Alcohol use: Not Currently    Comment: social  . Drug use: No  . Sexual activity: Yes    Birth control/protection: None  Other Topics Concern  . Not on file  Social History Narrative  . Not on  file   Social Determinants of Health   Financial Resource Strain: Not on file  Food Insecurity: No Food Insecurity  . Worried About Charity fundraiser in the Last Year: Never true  . Ran Out of Food in the Last Year: Never true  Transportation Needs: No Transportation Needs  . Lack of Transportation (Medical): No  . Lack of Transportation (Non-Medical): No  Physical Activity: Not on file  Stress: Not on file  Social Connections: Not on file    Family History: Family History  Problem Relation Age of Onset  . Kidney disease Mother   . Heart disease Son   . Diabetes Maternal Aunt     Allergies: No Known Allergies  (Not in a hospital admission)    Review of Systems   All systems reviewed and negative except as stated in HPI  Last menstrual period 06/01/2020. General appearance: {general exam:16600} Lungs: normal WOB  Heart: regular rate Abdomen: soft, non-tender; bowel sounds normal Extremities: no sign of DVT Presentation: {desc; fetal presentation:14558} Fetal monitoring{findings; monitor fetal heart monitor:31527} Uterine activity{Uterine contractions:31516}  Prenatal labs: ABO, Rh: O/Positive/-- (12/28 1125) Antibody: Negative (12/28 1125) Rubella: 10.70 (12/28 1125) RPR: Non Reactive (03/01 0847)  HBsAg: Negative (12/28 1125)  HIV: Non Reactive (03/01 0847)  GBS: Negative/-- (04/20 0928)  2 hr Glucola 86/144/135 Genetic screening: NIPS: LR Anatomy US normal   Prenatal Transfer Tool  Maternal Diabetes: No Genetic Screening: Normal Maternal Ultrasounds/Referrals: Normal Fetal Ultrasounds or other Referrals:  Referred to Materal Fetal Medicine  Maternal Substance Abuse:  {Maternal Substance Abuse:20223} Significant Maternal Medications:  Meds include: Other: Labetalol and BASA Significant Maternal Lab Results: Group B Strep negative  No results found for this or any previous visit (from the past 24 hour(s)).  Patient Active Problem List    Diagnosis Date Noted  . Thyroid nodule 02/01/2021  . Unwanted fertility 12/07/2020  . Supervision of high risk pregnancy, antepartum 09/28/2020  . AMA (advanced maternal age) multigravida 35+ 09/28/2020  . History of 2019 novel coronavirus disease (COVID-19) 09/28/2020  . Obesity affecting pregnancy 09/28/2020  . Chronic hypertension affecting pregnancy 09/16/2020  . H/O cesarean section complicating pregnancy 23/76/2831  . Anemia 01/01/2013    Assessment/Plan:  Meredith Chen is a 40 y.o. [id number] at [redacted]w[redacted]d here for TOLAC in the setting of IOL-CHTN.  #Labor: TOLAC, IOL-chronic HTN.  #MOD: VBAC (consent signed 12/21/20) #Pain: PRN #FWB: *** #ID:  GBS negative  #MOF: Breast  #MOC: BTL (consent signed 12/07/20) #Circ:  Meredith Chen  Jule Economy, Medical Student  02/14/2021, 8:13 PM

## 2021-02-15 ENCOUNTER — Encounter (HOSPITAL_COMMUNITY): Payer: Self-pay | Admitting: Family Medicine

## 2021-02-15 ENCOUNTER — Inpatient Hospital Stay (HOSPITAL_COMMUNITY)
Admission: AD | Admit: 2021-02-15 | Discharge: 2021-02-18 | DRG: 784 | Disposition: A | Discharge status: Home / Self Care | Payer: Medicaid Other | Attending: Obstetrics and Gynecology | Admitting: Obstetrics and Gynecology

## 2021-02-15 ENCOUNTER — Inpatient Hospital Stay (HOSPITAL_COMMUNITY): Payer: Medicaid Other

## 2021-02-15 ENCOUNTER — Inpatient Hospital Stay (HOSPITAL_COMMUNITY): Payer: Medicaid Other | Admitting: Anesthesiology

## 2021-02-15 ENCOUNTER — Other Ambulatory Visit: Payer: Self-pay

## 2021-02-15 DIAGNOSIS — Z3009 Encounter for other general counseling and advice on contraception: Secondary | ICD-10-CM | POA: Diagnosis present

## 2021-02-15 DIAGNOSIS — Z3A38 38 weeks gestation of pregnancy: Secondary | ICD-10-CM

## 2021-02-15 DIAGNOSIS — D62 Acute posthemorrhagic anemia: Secondary | ICD-10-CM | POA: Diagnosis not present

## 2021-02-15 DIAGNOSIS — Z7982 Long term (current) use of aspirin: Secondary | ICD-10-CM | POA: Diagnosis not present

## 2021-02-15 DIAGNOSIS — Z8616 Personal history of COVID-19: Secondary | ICD-10-CM | POA: Diagnosis present

## 2021-02-15 DIAGNOSIS — O99214 Obesity complicating childbirth: Secondary | ICD-10-CM | POA: Diagnosis present

## 2021-02-15 DIAGNOSIS — O1002 Pre-existing essential hypertension complicating childbirth: Secondary | ICD-10-CM | POA: Diagnosis present

## 2021-02-15 DIAGNOSIS — O9081 Anemia of the puerperium: Secondary | ICD-10-CM | POA: Diagnosis not present

## 2021-02-15 DIAGNOSIS — O10919 Unspecified pre-existing hypertension complicating pregnancy, unspecified trimester: Secondary | ICD-10-CM | POA: Diagnosis present

## 2021-02-15 DIAGNOSIS — O1092 Unspecified pre-existing hypertension complicating childbirth: Secondary | ICD-10-CM | POA: Diagnosis not present

## 2021-02-15 DIAGNOSIS — O09523 Supervision of elderly multigravida, third trimester: Secondary | ICD-10-CM

## 2021-02-15 DIAGNOSIS — O99210 Obesity complicating pregnancy, unspecified trimester: Secondary | ICD-10-CM | POA: Diagnosis present

## 2021-02-15 DIAGNOSIS — O34211 Maternal care for low transverse scar from previous cesarean delivery: Secondary | ICD-10-CM | POA: Diagnosis present

## 2021-02-15 DIAGNOSIS — D649 Anemia, unspecified: Secondary | ICD-10-CM | POA: Diagnosis present

## 2021-02-15 DIAGNOSIS — O09529 Supervision of elderly multigravida, unspecified trimester: Secondary | ICD-10-CM

## 2021-02-15 DIAGNOSIS — O34219 Maternal care for unspecified type scar from previous cesarean delivery: Secondary | ICD-10-CM | POA: Diagnosis present

## 2021-02-15 DIAGNOSIS — Z302 Encounter for sterilization: Secondary | ICD-10-CM

## 2021-02-15 DIAGNOSIS — O99213 Obesity complicating pregnancy, third trimester: Secondary | ICD-10-CM

## 2021-02-15 DIAGNOSIS — O0990 Supervision of high risk pregnancy, unspecified, unspecified trimester: Secondary | ICD-10-CM

## 2021-02-15 LAB — CBC
HCT: 33.5 % — ABNORMAL LOW (ref 36.0–46.0)
HCT: 30.9 % — ABNORMAL LOW (ref 36.0–46.0)
Hemoglobin: 11.1 g/dL — ABNORMAL LOW (ref 12.0–15.0)
Hemoglobin: 10.1 g/dL — ABNORMAL LOW (ref 12.0–15.0)
MCH: 27.5 pg (ref 26.0–34.0)
MCH: 27.3 pg (ref 26.0–34.0)
MCHC: 33.1 g/dL (ref 30.0–36.0)
MCHC: 32.7 g/dL (ref 30.0–36.0)
MCV: 83.1 fL (ref 80.0–100.0)
MCV: 83.5 fL (ref 80.0–100.0)
Platelets: 307 10*3/uL (ref 150–400)
Platelets: 265 10*3/uL (ref 150–400)
RBC: 4.03 MIL/uL (ref 3.87–5.11)
RBC: 3.7 MIL/uL — ABNORMAL LOW (ref 3.87–5.11)
RDW: 20.3 % — ABNORMAL HIGH (ref 11.5–15.5)
RDW: 20.3 % — ABNORMAL HIGH (ref 11.5–15.5)
WBC: 6.5 10*3/uL (ref 4.0–10.5)
WBC: 7.1 10*3/uL (ref 4.0–10.5)
nRBC: 0 % (ref 0.0–0.2)
nRBC: 0 % (ref 0.0–0.2)

## 2021-02-15 LAB — COMPREHENSIVE METABOLIC PANEL
ALT: 27 U/L (ref 0–44)
AST: 19 U/L (ref 15–41)
Albumin: 3.1 g/dL — ABNORMAL LOW (ref 3.5–5.0)
Alkaline Phosphatase: 133 U/L — ABNORMAL HIGH (ref 38–126)
Anion gap: 8 (ref 5–15)
BUN: 10 mg/dL (ref 6–20)
CO2: 19 mmol/L — ABNORMAL LOW (ref 22–32)
Calcium: 9.4 mg/dL (ref 8.9–10.3)
Chloride: 110 mmol/L (ref 98–111)
Creatinine, Ser: 0.68 mg/dL (ref 0.44–1.00)
GFR, Estimated: >60 mL/min (ref >60)
Glucose, Bld: 112 mg/dL — ABNORMAL HIGH (ref 70–99)
Potassium: 3.4 mmol/L — ABNORMAL LOW (ref 3.5–5.1)
Sodium: 137 mmol/L (ref 135–145)
Total Bilirubin: 0.4 mg/dL (ref 0.3–1.2)
Total Protein: 7.2 g/dL (ref 6.5–8.1)

## 2021-02-15 LAB — TYPE AND SCREEN
ABO/RH(D): O POS
Antibody Screen: NEGATIVE

## 2021-02-15 LAB — PROTEIN / CREATININE RATIO, URINE
Creatinine, Urine: 149.42 mg/dL
Protein Creatinine Ratio: 0.12 mg/mg{Cre} (ref 0.00–0.15)
Total Protein, Urine: 18 mg/dL

## 2021-02-15 LAB — RPR: RPR Ser Ql: NONREACTIVE

## 2021-02-15 MED ORDER — FENTANYL-BUPIVACAINE-NACL 0.5-0.125-0.9 MG/250ML-% EP SOLN
12.0000 mL/h | EPIDURAL | Status: DC | PRN
  Filled 2021-02-15 (×2): qty 250

## 2021-02-15 MED ORDER — OXYTOCIN-SODIUM CHLORIDE 30-0.9 UT/500ML-% IV SOLN
1.0000 m[IU]/min | INTRAVENOUS | Status: DC
  Administered 2021-02-15: 1 m[IU]/min via INTRAVENOUS
  Filled 2021-02-15: qty 500

## 2021-02-15 MED ORDER — OXYCODONE-ACETAMINOPHEN 5-325 MG PO TABS: 2.0000 | ORAL_TABLET | ORAL | Status: DC | PRN

## 2021-02-15 MED ORDER — FENTANYL-BUPIVACAINE-NACL 0.5-0.125-0.9 MG/250ML-% EP SOLN
EPIDURAL | Status: DC | PRN
  Administered 2021-02-15: 12 mL/h via EPIDURAL
  Administered 2021-02-16: 500 ug via EPIDURAL

## 2021-02-15 MED ORDER — LABETALOL HCL 200 MG PO TABS
300.0000 mg | ORAL_TABLET | Freq: Two times a day (BID) | ORAL | Status: DC
  Administered 2021-02-15 (×3): 300 mg via ORAL
  Filled 2021-02-15 (×3): qty 1

## 2021-02-15 MED ORDER — LIDOCAINE HCL (PF) 1 % IJ SOLN
INTRAMUSCULAR | Status: DC | PRN
  Administered 2021-02-15: 6 mL via EPIDURAL

## 2021-02-15 MED ORDER — ACETAMINOPHEN 325 MG PO TABS: 650.0000 mg | ORAL_TABLET | ORAL | Status: DC | PRN

## 2021-02-15 MED ORDER — DIPHENHYDRAMINE HCL 50 MG/ML IJ SOLN: 12.5000 mg | INTRAMUSCULAR | Status: DC | PRN

## 2021-02-15 MED ORDER — LIDOCAINE HCL (PF) 1 % IJ SOLN: 30.0000 mL | INTRAMUSCULAR | Status: DC | PRN

## 2021-02-15 MED ORDER — FLEET ENEMA 7-19 GM/118ML RE ENEM: 1.0000 | ENEMA | RECTAL | Status: DC | PRN

## 2021-02-15 MED ORDER — EPHEDRINE 5 MG/ML INJ: 10.0000 mg | INTRAVENOUS | Status: DC | PRN

## 2021-02-15 MED ORDER — LACTATED RINGERS IV SOLN
500.0000 mL | INTRAVENOUS | Status: DC | PRN
  Administered 2021-02-15: 500 mL via INTRAVENOUS

## 2021-02-15 MED ORDER — LACTATED RINGERS IV SOLN: INTRAVENOUS | Status: DC

## 2021-02-15 MED ORDER — OXYTOCIN BOLUS FROM INFUSION: 333.0000 mL | Freq: Once | INTRAVENOUS | Status: DC

## 2021-02-15 MED ORDER — LACTATED RINGERS IV SOLN: 500.0000 mL | Freq: Once | INTRAVENOUS | Status: DC

## 2021-02-15 MED ORDER — OXYCODONE-ACETAMINOPHEN 5-325 MG PO TABS: 1.0000 | ORAL_TABLET | ORAL | Status: DC | PRN

## 2021-02-15 MED ORDER — PHENYLEPHRINE 40 MCG/ML (10ML) SYRINGE FOR IV PUSH (FOR BLOOD PRESSURE SUPPORT): 80.0000 ug | PREFILLED_SYRINGE | INTRAVENOUS | Status: DC | PRN

## 2021-02-15 MED ORDER — OXYTOCIN-SODIUM CHLORIDE 30-0.9 UT/500ML-% IV SOLN: 2.5000 [IU]/h | INTRAVENOUS | Status: DC

## 2021-02-15 MED ORDER — SOD CITRATE-CITRIC ACID 500-334 MG/5ML PO SOLN
30.0000 mL | ORAL | Status: DC | PRN
  Administered 2021-02-16: 30 mL via ORAL

## 2021-02-15 MED ORDER — ONDANSETRON HCL 4 MG/2ML IJ SOLN
4.0000 mg | Freq: Four times a day (QID) | INTRAMUSCULAR | Status: DC | PRN
  Administered 2021-02-15: 4 mg via INTRAVENOUS
  Filled 2021-02-15: qty 2

## 2021-02-15 MED ORDER — ACETAMINOPHEN 500 MG PO TABS
1000.0000 mg | ORAL_TABLET | Freq: Four times a day (QID) | ORAL | Status: DC | PRN
  Administered 2021-02-15: 1000 mg via ORAL
  Filled 2021-02-15: qty 2

## 2021-02-15 MED ORDER — TERBUTALINE SULFATE 1 MG/ML IJ SOLN: 0.2500 mg | Freq: Once | INTRAMUSCULAR | Status: DC | PRN

## 2021-02-15 NOTE — Progress Notes (Addendum)
LABOR PROGRESS NOTE  Meredith Chen is a 40 y.o. [id number] at [redacted]w[redacted]d  admitted for TOLAC, IOL-CHTN.   Subjective: HA improved with Tylenol. Feeling contractions every 2-3 minutes.   Objective: BP (!) 96/43   Pulse 82   Temp 98.3 F (36.8 C) (Oral)   Resp 18   Ht 5\' 9"  (1.753 m)   Wt 114.8 kg   LMP 06/01/2020   SpO2 97%   BMI 37.38 kg/m  or  Vitals:   02/15/21 0330 02/15/21 0336 02/15/21 0400 02/15/21 0430  BP: (!) 101/38 (!) 104/56 106/64 (!) 96/43  Pulse: 82 77 81 82  Resp:      Temp:      TempSrc:      SpO2:      Weight:      Height:        Cervical Exam:  Dilation: 1 Effacement (%): 50 Station: -2 Exam by:: Dr. Sylvester Harder  FB in place  FHT: baseline rate 130, moderate varibility, +acel, none Toco: every 3-4 minutes, difficulty tracing with toco  Labs: Lab Results  Component Value Date   WBC 6.5 02/15/2021   HGB 11.1 (L) 02/15/2021   HCT 33.5 (L) 02/15/2021   MCV 83.1 02/15/2021   PLT 307 02/15/2021    Patient Active Problem List   Diagnosis Date Noted  . Thyroid nodule 02/01/2021  . Unwanted fertility 12/07/2020  . Supervision of high risk pregnancy, antepartum 09/28/2020  . AMA (advanced maternal age) multigravida 35+ 09/28/2020  . History of 2019 novel coronavirus disease (COVID-19) 09/28/2020  . Obesity affecting pregnancy 09/28/2020  . Chronic hypertension affecting pregnancy 09/16/2020  . H/O cesarean section complicating pregnancy 53/97/6734  . Anemia 01/01/2013    Assessment / Plan: 40 y.o. [id number] at [redacted]w[redacted]d here for TOLAC, IOL-CHTN   Labor: TOLAC, IOL-CHTN. FB still in place. Variability improved with IV bolus. Continue to titrate up pitocin to 18mL/hr. Fetal Wellbeing:  Cat I strip   Pain Control:  PRN, planning epidural  CHTN: Labetalol 300 mg BID. BPs improved and stable. PreE labs WNL. Asymptomatic. CTM  Dalene Carrow, MS3 02/15/2021, 5:13 AM   GME ATTESTATION:  I saw and evaluated the patient. I agree with the findings and the plan  of care as documented in the medical student's note.  Arrie Senate, MD OB Fellow, [place] for [place] 02/15/2021 6:49 AM

## 2021-02-15 NOTE — Anesthesia Preprocedure Evaluation (Signed)
Anesthesia Evaluation  Patient identified by MRN, date of birth, ID band Patient awake    Reviewed: Allergy & Precautions, H&P , NPO status , Patient's Chart, lab work & pertinent test results, reviewed documented beta blocker date and time   Airway Mallampati: III  TM Distance: >3 FB Neck ROM: full    Dental no notable dental hx. (+) Teeth Intact, Dental Advisory Given   Pulmonary neg pulmonary ROS,    Pulmonary exam normal breath sounds clear to auscultation       Cardiovascular hypertension, Pt. on home beta blockers Normal cardiovascular exam Rhythm:regular Rate:Normal     Neuro/Psych negative neurological ROS  negative psych ROS   GI/Hepatic negative GI ROS, Neg liver ROS,   Endo/Other  negative endocrine ROS  Renal/GU negative Renal ROS  negative genitourinary   Musculoskeletal negative musculoskeletal ROS (+)   Abdominal (+) + obese,   Peds negative pediatric ROS (+)  Hematology  (+) Blood dyscrasia, anemia ,   Anesthesia Other Findings   Reproductive/Obstetrics negative OB ROS                             Anesthesia Physical Anesthesia Plan  ASA: III  Anesthesia Plan: Epidural   Post-op Pain Management:    Induction:   PONV Risk Score and Plan: 2  Airway Management Planned: Natural Airway  Additional Equipment:   Intra-op Plan:   Post-operative Plan:   Informed Consent: I have reviewed the patients History and Physical, chart, labs and discussed the procedure including the risks, benefits and alternatives for the proposed anesthesia with the patient or authorized representative who has indicated his/her understanding and acceptance.     Dental Advisory Given  Plan Discussed with: Anesthesiologist  Anesthesia Plan Comments: (Labs checked- platelets confirmed with RN in room. Fetal heart tracing, per RN, reported to be stable enough for sitting  procedure. Discussed epidural, and patient consents to the procedure:  included risk of possible headache,backache, failed block, allergic reaction, and nerve injury. This patient was asked if she had any questions or concerns before the procedure started.)        Anesthesia Quick Evaluation

## 2021-02-15 NOTE — Progress Notes (Addendum)
Labor Progress Note Meredith Chen is a 40 y.o. [id number] at 105w2d presented for TOLAC, IOL-CHTN  S: Currently no complaints. Patient desires epidural.  O:  BP 115/77   Pulse 78   Temp 98.1 F (36.7 C) (Oral)   Resp 18   Ht 5\' 9"  (1.753 m)   Wt 114.8 kg   LMP 06/01/2020   SpO2 97%   BMI 37.38 kg/m  EFM: 125 bpm/moderate variability/positive accelerations/no decels Toco: Difficult to read  CVE: Dilation: 6 Effacement (%): 70 Station: -2 Presentation: Vertex Exam by:: Amor Packard, md   A&P: 40 y.o. [name] [redacted]w[redacted]d here for TOLAC, IOL-CHTN #Labor: Progressing well. FB out 0740 per nurse. Pitocin started at [facility]. Continue to titrate pitocin as clinically indicated. Patient is in agreement with AROM, but would like epidural first.  #Pain: prepping for epidural #FWB: Category 1 strip #GBS negative  #CHTN: Labetalol 300 mg BID. Bps improved and stable. PreE labs wnl. Asymptomatic. CTM. #MOF: Both #MOC: PP BTL (consent signed 12/07/20) #Circ: N/A #Anemia of pregnancy: hgb 10.1 on admission. Supplement PO iron every other day.   Ladonna Snide, Medical Student 11:14 AM  Attestation of Supervision of Student:  I confirm that I have verified the information documented in the medical student's note and that I have also personally reperformed the history, physical exam and all medical decision making activities.  I have verified that all services and findings are accurately documented in this student's note; and I agree with management and plan as outlined in the documentation. I have also made any necessary editorial changes.  Randa Ngo, [place] for [employer], [place]

## 2021-02-15 NOTE — H&P (Addendum)
OBSTETRIC ADMISSION HISTORY AND PHYSICAL  Meredith Chen is a 40 y.o. female [phone] with IUP at 42w2dby 6 week [place] for TOLAC, IOL-CHTN. She reports +FMs, No LOF, no VB, or peripheral edema, and RUQ pain. She has had a persistent headache since this AM with some changes in vision. Vision changes have resolved, but continues to have headache. She has not taken anything for pain. She last took labetalol at 12 PM on 5/3 and did not take evening dose. She plans on breast feeding. She request BTL for birth control (consent 12/07/20). [facility] Dating: By 6 week [place]--->  Estimated Date of Delivery: 02/27/21  Sono:  '@[redacted]w[redacted]d' , normal anatomy, cephalic presentation, 57 lie, 3704g, 93% EFW, BPP 8/8   Prenatal History/Complications:  Chlamydia, adequately treated, TOC neg 4/20 Chronic HTN (labetalol 300 mg BID)  COVID affecting pregnancy (08/09/20) AMA Thyroid nodule  Anemia of pregnancy (last iron infusion 12/2020)   Past Medical History: Past Medical History:  Diagnosis Date  . Abnormal Pap smear   . Chlamydia   . COVID-19 07/2020  . Fibroids   . Hypertension    during this pregnancy   . Medical history non-contributory   . Pneumonia due to COVID-19 virus 07/2020  . Trichomonas    January 2014  . Vaginal Pap smear, abnormal     Past Surgical History: Past Surgical History:  Procedure Laterality Date  . CESAREAN SECTION      Obstetrical History: OB History    Gravida  6   Para  4   Term  4   Preterm      AB  1   Living  5     SAB      IAB  1   Ectopic      Multiple  1   Live Births  5           Social History Social History   Socioeconomic History  . Marital status: Single    Spouse name: Not on file  . Number of children: Not on file  . Years of education: Not on file  . Highest education level: Not on file  Occupational History  . Not on file  Tobacco Use  . Smoking status: Never Smoker  . Smokeless  tobacco: Never Used  Vaping Use  . Vaping Use: Never used  Substance and Sexual Activity  . Alcohol use: Not Currently    Comment: social  . Drug use: No  . Sexual activity: Yes    Birth control/protection: None  Other Topics Concern  . Not on file  Social History Narrative  . Not on file   Social Determinants of Health   Financial Resource Strain: Not on file  Food Insecurity: No Food Insecurity  . Worried About RCharity fundraiserin the Last Year: Never true  . Ran Out of Food in the Last Year: Never true  Transportation Needs: No Transportation Needs  . Lack of Transportation (Medical): No  . Lack of Transportation (Non-Medical): No  Physical Activity: Not on file  Stress: Not on file  Social Connections: Not on file    Family History: Family History  Problem Relation Age of Onset  . Kidney disease Mother   . Heart disease Son   . Diabetes Maternal Aunt     Allergies: No Known Allergies  Medications Prior to Admission  Medication Sig Dispense Refill Last Dose  . aspirin 81 MG  chewable tablet Chew 81 mg by mouth daily.   02/14/2021 at 1200  . labetalol (NORMODYNE) 300 MG tablet Take 1 tablet (300 mg total) by mouth 2 (two) times daily. 60 tablet 5 02/14/2021 at 1200  . Prenatal Vit-Fe Fumarate-FA (PRENATAL VITAMINS PO) Take 1 tablet by mouth daily.   02/14/2021 at 1200  . Blood Pressure Monitoring (BLOOD PRESSURE KIT) DEVI 1 Device by Does not apply route as needed. 1 each 0      Review of Systems   All systems reviewed and negative except as stated in HPI  Blood pressure (!) 143/95, pulse 95, temperature 98.3 F (36.8 C), temperature source Oral, resp. rate 18, height '5\' 9"'  (1.753 m), weight 114.8 kg, last menstrual period 06/01/2020, SpO2 97 %. General appearance: alert and no distress Lungs: normal WOB  Heart: regular rate Abdomen: soft, non-tender Extremities: no sign of DVT Presentation: cephalic by cervical exam Fetal monitoringBaseline: 140 bpm,  Variability: Fair (1-6 bpm), Accelerations: none and Decelerations: Absent Uterine activity: irritability Dilation: 1 Effacement (%): 50 Station: -2 Exam by:: Dr. Sylvester Harder   Prenatal labs: ABO, Rh: O/Positive/-- (12/28 1125) Antibody: Negative (12/28 1125) Rubella: 10.70 (12/28 1125) RPR: Non Reactive (03/01 0847)  HBsAg: Negative (12/28 1125)  HIV: Non Reactive (03/01 0847)  GBS: Negative/-- (04/20 0928)  2 hr Glucola 86/144/135 Genetic screening low risk  Anatomy US normal   Prenatal Transfer Tool  Maternal Diabetes: No Genetic Screening: Normal Maternal Ultrasounds/Referrals: Normal Fetal Ultrasounds or other Referrals:  Referred to Materal Fetal Medicine  Maternal Substance Abuse:  No Significant Maternal Medications:  Meds include: Other:  labetalol 300 mg and BASA Significant Maternal Lab Results: Group B Strep negative  No results found for this or any previous visit (from the past 24 hour(s)).  Patient Active Problem List   Diagnosis Date Noted  . Thyroid nodule 02/01/2021  . Unwanted fertility 12/07/2020  . Supervision of high risk pregnancy, antepartum 09/28/2020  . AMA (advanced maternal age) multigravida 35+ 09/28/2020  . History of 2019 novel coronavirus disease (COVID-19) 09/28/2020  . Obesity affecting pregnancy 09/28/2020  . Chronic hypertension affecting pregnancy 09/16/2020  . H/O cesarean section complicating pregnancy 99/24/2683  . Anemia 01/01/2013    Assessment/Plan:  Meredith PATNAUDE is a 40 y.o. M1D6222 at 44w2dhere for TOLAC with IOL-CHTN.   #IOL: TOLAC with IOL-CHTN. FB placed 0045. Pitocin started at 1 mL/hr, continue titrate. #Pain: PRN, desires epidural #FWB: Cat 1 strip  #ID: GBS negative  #MOF: Breast  #MOC:BTL (consent signed 12/07/20)  #Circ:  Na #Chronic HTN: +HA. On labetalol 300 BID, given at 0100. PEC pending. Will treat HA with tylenol and reassess  - low threshold to start Mag in the setting of persistent symptoms  #Anemia  of pregnancy: feraheme outpatient, admit hgb pending  AJule Economy Medical Student  02/15/2021, 1:33 AM  GME ATTESTATION:  I saw and evaluated the patient. I agree with the findings and the plan of care as documented in the medical student's note.  AArrie Senate MD OB Fellow, [place]5/01/2021 1:39 AM

## 2021-02-15 NOTE — Anesthesia Procedure Notes (Signed)
Epidural Patient location during procedure: OB Start time: 02/15/2021 11:39 AM End time: 02/15/2021 11:45 AM  Staffing Anesthesiologist: Janeece Riggers, MD  Preanesthetic Checklist Completed: patient identified, IV checked, site marked, risks and benefits discussed, surgical consent, monitors and equipment checked, pre-op evaluation and timeout performed  Epidural Patient position: sitting Prep: DuraPrep and site prepped and draped Patient monitoring: continuous pulse ox and blood pressure Approach: midline Location: L3-L4 Injection technique: LOR air  Needle:  Needle type: Tuohy  Needle gauge: 17 G Needle length: 9 cm and 9 Needle insertion depth: 7 cm Catheter type: closed end flexible Catheter size: 19 Gauge Catheter at skin depth: 12 cm Test dose: negative  Assessment Events: blood not aspirated, injection not painful, no injection resistance, no paresthesia and negative IV test

## 2021-02-15 NOTE — Progress Notes (Signed)
Labor Progress Note Meredith Chen is a 40 y.o. [id number] at [redacted]w[redacted]d presented for TOLAC, IOL-CHTN  S: Pt comfortable with epidural in place. No concerns at this time. Partner and sister at bedside.  O:  BP 122/78   Pulse 91   Temp 97.9 F (36.6 C) (Oral)   Resp 16   Ht 5\' 9"  (1.753 m)   Wt 114.8 kg   LMP 06/01/2020   SpO2 97%   BMI 37.38 kg/m  EFM: 120 bpm/moderate variability/positive accelerations/no decels Toco: no visible contractions with IUPC in place  CVE: Dilation: 7 Effacement (%): 80 Station: -1 Presentation: Vertex Exam by:: Jacquelyne Quarry, MD   A&P: 40 y.o. Y1P5093 [redacted]w[redacted]d here for TOLAC, IOL-CHTN #Labor: S/p FB ([id number]). Pitocin continued since 0115. AROM for light meconium stained fluid at 1300, after which fetal presentation confirmed OP on bedside ultrasound. Given recent lack of cervical change despite up-titration of pitocin, will continue pitocin break (off at 1730) with maternal repositioning to facilitate fetal rotation. #Pain: epidural in place #FWB: Category 1 strip #GBS negative  #CHTN: Labetalol 300 mg BID. Blood pressures normal to mild range. PreE labs wnl. Asymptomatic. CTM. #MOF: Both #MOC: PP BTL (consent signed 12/07/20) #Circ: N/A #Anemia of pregnancy: hgb 10.1 on admission. Supplement PO iron every other day.   Randa Ngo, MD 7:23 PM

## 2021-02-15 NOTE — Progress Notes (Signed)
Labor Progress Note Meredith Chen is a 40 y.o. [id number] at [redacted]w[redacted]d presented for TOLAC, IOL-CHTN  S: Pt comfortable overall, not feeling contractions.   O:  BP 123/80   Pulse 77   Temp 98.2 F (36.8 C) (Oral)   Resp 16   Ht 5\' 9"  (1.753 m)   Wt 114.8 kg   LMP 06/01/2020   SpO2 97%   BMI 37.38 kg/m  EFM: 130 bpm/moderate variability/positive accelerations/no decels Toco: no visible contractions with IUPC in place  CVE: Dilation: 6 Effacement (%): 70 Station: -1 Presentation: Vertex Exam by:: Meredith Skeans, MD   A&P: 40 y.o. [name] [redacted]w[redacted]d here for TOLAC, IOL-CHTN #Labor: S/p FB ([id number]). Pitocin started 0115. AROM for light meconium stained fluid at 1300, after which fetal presentation confirmed OP on bedside ultrasound. Pitocin break from 1730-2130. Rechecked and baby feels poorly engaged, OP on exam and more like 6 cm. Had discussion with patient about restarting pitocin, but that given exam and minimal to no change throughout day, there is concern for Arrest of Dilation. Will plan to get her back in good labor pattern and recheck, however did discuss possibility of cesarean section if no change is made. She is agreeable to plan.   #Pain: epidural in place #FWB: Category 1 strip #GBS negative  #CHTN: Labetalol 300 mg BID. Blood pressures normal to mild range. PreE labs wnl. Asymptomatic. CTM. #MOF: Both #MOC: PP BTL (consent signed 12/07/20) #Circ: N/A #Anemia of pregnancy: hgb 10.1 on admission. Supplement PO iron every other day.   Meredith Berlin, MD 9:33 PM

## 2021-02-16 ENCOUNTER — Encounter (HOSPITAL_COMMUNITY)
Admission: AD | Disposition: A | Discharge status: Home / Self Care | Payer: Self-pay | Source: Home / Self Care | Attending: Obstetrics and Gynecology

## 2021-02-16 ENCOUNTER — Encounter (HOSPITAL_COMMUNITY): Payer: Self-pay | Admitting: Family Medicine

## 2021-02-16 DIAGNOSIS — Z302 Encounter for sterilization: Secondary | ICD-10-CM

## 2021-02-16 DIAGNOSIS — Z3A38 38 weeks gestation of pregnancy: Secondary | ICD-10-CM

## 2021-02-16 DIAGNOSIS — O1092 Unspecified pre-existing hypertension complicating childbirth: Secondary | ICD-10-CM

## 2021-02-16 DIAGNOSIS — O34211 Maternal care for low transverse scar from previous cesarean delivery: Secondary | ICD-10-CM

## 2021-02-16 LAB — CBC
HCT: 24.9 % — ABNORMAL LOW (ref 36.0–46.0)
Hemoglobin: 8 g/dL — ABNORMAL LOW (ref 12.0–15.0)
MCH: 27.5 pg (ref 26.0–34.0)
MCHC: 32.1 g/dL (ref 30.0–36.0)
MCV: 85.6 fL (ref 80.0–100.0)
Platelets: 277 10*3/uL (ref 150–400)
RBC: 2.91 MIL/uL — ABNORMAL LOW (ref 3.87–5.11)
RDW: 20.2 % — ABNORMAL HIGH (ref 11.5–15.5)
WBC: 13.6 10*3/uL — ABNORMAL HIGH (ref 4.0–10.5)
nRBC: 0 % (ref 0.0–0.2)

## 2021-02-16 SURGERY — Surgical Case
Anesthesia: Epidural

## 2021-02-16 MED ORDER — DIPHENHYDRAMINE HCL 25 MG PO CAPS: 25.0000 mg | ORAL_CAPSULE | ORAL | Status: DC | PRN

## 2021-02-16 MED ORDER — ONDANSETRON HCL 4 MG/2ML IJ SOLN: 4.0000 mg | Freq: Three times a day (TID) | INTRAMUSCULAR | Status: DC | PRN

## 2021-02-16 MED ORDER — TRANEXAMIC ACID-NACL 1000-0.7 MG/100ML-% IV SOLN
INTRAVENOUS | Status: AC
  Filled 2021-02-16: qty 100

## 2021-02-16 MED ORDER — ONDANSETRON HCL 4 MG/2ML IJ SOLN
INTRAMUSCULAR | Status: AC
  Filled 2021-02-16: qty 2

## 2021-02-16 MED ORDER — MENTHOL 3 MG MT LOZG: 1.0000 | LOZENGE | OROMUCOSAL | Status: DC | PRN

## 2021-02-16 MED ORDER — DEXMEDETOMIDINE (PRECEDEX) IN NS 20 MCG/5ML (4 MCG/ML) IV SYRINGE
PREFILLED_SYRINGE | INTRAVENOUS | Status: AC
  Filled 2021-02-16: qty 5

## 2021-02-16 MED ORDER — SIMETHICONE 80 MG PO CHEW
80.0000 mg | CHEWABLE_TABLET | Freq: Three times a day (TID) | ORAL | Status: DC
  Administered 2021-02-16 – 2021-02-18 (×5): 80 mg via ORAL
  Filled 2021-02-16 (×5): qty 1

## 2021-02-16 MED ORDER — NALOXONE HCL 0.4 MG/ML IJ SOLN: 0.4000 mg | INTRAMUSCULAR | Status: DC | PRN

## 2021-02-16 MED ORDER — NALBUPHINE HCL 10 MG/ML IJ SOLN
5.0000 mg | INTRAMUSCULAR | Status: DC | PRN
Start: 2021-02-16 — End: 2021-02-18

## 2021-02-16 MED ORDER — KETOROLAC TROMETHAMINE 30 MG/ML IJ SOLN
30.0000 mg | Freq: Four times a day (QID) | INTRAMUSCULAR | Status: AC
  Administered 2021-02-16 – 2021-02-17 (×3): 30 mg via INTRAVENOUS
  Filled 2021-02-16 (×3): qty 1

## 2021-02-16 MED ORDER — DIPHENHYDRAMINE HCL 50 MG/ML IJ SOLN: 12.5000 mg | INTRAMUSCULAR | Status: DC | PRN

## 2021-02-16 MED ORDER — FENTANYL CITRATE (PF) 100 MCG/2ML IJ SOLN
INTRAMUSCULAR | Status: DC | PRN
  Administered 2021-02-16: 100 ug via EPIDURAL

## 2021-02-16 MED ORDER — SCOPOLAMINE 1 MG/3DAYS TD PT72: 1.0000 | MEDICATED_PATCH | Freq: Once | TRANSDERMAL | Status: DC

## 2021-02-16 MED ORDER — PRENATAL MULTIVITAMIN CH
1.0000 | ORAL_TABLET | Freq: Every day | ORAL | Status: DC
  Administered 2021-02-16 – 2021-02-17 (×2): 1 via ORAL
  Filled 2021-02-16 (×2): qty 1

## 2021-02-16 MED ORDER — WITCH HAZEL-GLYCERIN EX PADS: 1.0000 "application " | MEDICATED_PAD | CUTANEOUS | Status: DC | PRN

## 2021-02-16 MED ORDER — NALOXONE HCL 4 MG/10ML IJ SOLN
1.0000 ug/kg/h | INTRAVENOUS | Status: DC | PRN
  Filled 2021-02-16: qty 5

## 2021-02-16 MED ORDER — TETANUS-DIPHTH-ACELL PERTUSSIS 5-2.5-18.5 LF-MCG/0.5 IM SUSY: 0.5000 mL | PREFILLED_SYRINGE | Freq: Once | INTRAMUSCULAR | Status: DC

## 2021-02-16 MED ORDER — ZOLPIDEM TARTRATE 5 MG PO TABS: 5.0000 mg | ORAL_TABLET | Freq: Every evening | ORAL | Status: DC | PRN

## 2021-02-16 MED ORDER — OXYTOCIN-SODIUM CHLORIDE 30-0.9 UT/500ML-% IV SOLN: 2.5000 [IU]/h | INTRAVENOUS | Status: AC

## 2021-02-16 MED ORDER — AMLODIPINE BESYLATE 5 MG PO TABS
5.0000 mg | ORAL_TABLET | Freq: Every day | ORAL | Status: DC
  Filled 2021-02-16: qty 1

## 2021-02-16 MED ORDER — ONDANSETRON HCL 4 MG/2ML IJ SOLN
INTRAMUSCULAR | Status: DC | PRN
  Administered 2021-02-16: 4 mg via INTRAVENOUS

## 2021-02-16 MED ORDER — NALBUPHINE HCL 10 MG/ML IJ SOLN: 5.0000 mg | Freq: Once | INTRAMUSCULAR | Status: DC | PRN

## 2021-02-16 MED ORDER — MEPERIDINE HCL 25 MG/ML IJ SOLN: 6.2500 mg | INTRAMUSCULAR | Status: DC | PRN

## 2021-02-16 MED ORDER — MORPHINE SULFATE (PF) 0.5 MG/ML IJ SOLN
INTRAMUSCULAR | Status: AC
  Filled 2021-02-16: qty 10

## 2021-02-16 MED ORDER — DIBUCAINE (PERIANAL) 1 % EX OINT: 1.0000 "application " | TOPICAL_OINTMENT | CUTANEOUS | Status: DC | PRN

## 2021-02-16 MED ORDER — TRANEXAMIC ACID-NACL 1000-0.7 MG/100ML-% IV SOLN
INTRAVENOUS | Status: DC | PRN
  Administered 2021-02-16: 1000 mg via INTRAVENOUS

## 2021-02-16 MED ORDER — SODIUM CHLORIDE 0.9% FLUSH: 3.0000 mL | INTRAVENOUS | Status: DC | PRN

## 2021-02-16 MED ORDER — MORPHINE SULFATE (PF) 0.5 MG/ML IJ SOLN
INTRAMUSCULAR | Status: DC | PRN
  Administered 2021-02-16: 3 mg via EPIDURAL

## 2021-02-16 MED ORDER — SIMETHICONE 80 MG PO CHEW: 80.0000 mg | CHEWABLE_TABLET | ORAL | Status: DC | PRN

## 2021-02-16 MED ORDER — SODIUM CHLORIDE 0.9 % IV SOLN
INTRAVENOUS | Status: AC
  Filled 2021-02-16: qty 500

## 2021-02-16 MED ORDER — STERILE WATER FOR IRRIGATION IR SOLN
Status: DC | PRN
  Administered 2021-02-16: 1

## 2021-02-16 MED ORDER — SODIUM CHLORIDE 0.9 % IV SOLN
500.0000 mg | INTRAVENOUS | Status: AC
  Administered 2021-02-16: 500 mg via INTRAVENOUS

## 2021-02-16 MED ORDER — NALBUPHINE HCL 10 MG/ML IJ SOLN: 5.0000 mg | INTRAMUSCULAR | Status: DC | PRN

## 2021-02-16 MED ORDER — DIPHENHYDRAMINE HCL 50 MG/ML IJ SOLN
INTRAMUSCULAR | Status: AC
  Filled 2021-02-16: qty 1

## 2021-02-16 MED ORDER — TRANEXAMIC ACID-NACL 1000-0.7 MG/100ML-% IV SOLN
INTRAVENOUS | Status: AC
  Filled 2021-02-16: qty 200

## 2021-02-16 MED ORDER — LIDOCAINE-EPINEPHRINE (PF) 2 %-1:200000 IJ SOLN
INTRAMUSCULAR | Status: DC | PRN
  Administered 2021-02-16: 10 mL via EPIDURAL

## 2021-02-16 MED ORDER — LACTATED RINGERS AMNIOINFUSION: INTRAVENOUS | Status: DC

## 2021-02-16 MED ORDER — SENNOSIDES-DOCUSATE SODIUM 8.6-50 MG PO TABS
2.0000 | ORAL_TABLET | ORAL | Status: DC
  Administered 2021-02-17: 2 via ORAL
  Filled 2021-02-16: qty 2

## 2021-02-16 MED ORDER — LACTATED RINGERS IV SOLN: INTRAVENOUS | Status: DC

## 2021-02-16 MED ORDER — SCOPOLAMINE 1 MG/3DAYS TD PT72
MEDICATED_PATCH | TRANSDERMAL | Status: DC | PRN
  Administered 2021-02-16: 1 via TRANSDERMAL

## 2021-02-16 MED ORDER — DIPHENHYDRAMINE HCL 50 MG/ML IJ SOLN
INTRAMUSCULAR | Status: DC | PRN
  Administered 2021-02-16: 12.5 mg via INTRAVENOUS

## 2021-02-16 MED ORDER — KETOROLAC TROMETHAMINE 30 MG/ML IJ SOLN
INTRAMUSCULAR | Status: AC
  Filled 2021-02-16: qty 1

## 2021-02-16 MED ORDER — ACETAMINOPHEN 500 MG PO TABS
1000.0000 mg | ORAL_TABLET | Freq: Three times a day (TID) | ORAL | Status: DC
  Administered 2021-02-16 – 2021-02-18 (×5): 1000 mg via ORAL
  Filled 2021-02-16 (×7): qty 2

## 2021-02-16 MED ORDER — CEFAZOLIN SODIUM-DEXTROSE 2-4 GM/100ML-% IV SOLN
2.0000 g | INTRAVENOUS | Status: AC
  Administered 2021-02-16: 2 g via INTRAVENOUS

## 2021-02-16 MED ORDER — ENOXAPARIN SODIUM 60 MG/0.6ML IJ SOSY
50.0000 mg | PREFILLED_SYRINGE | INTRAMUSCULAR | Status: DC
  Administered 2021-02-17 – 2021-02-18 (×2): 50 mg via SUBCUTANEOUS
  Filled 2021-02-16 (×2): qty 0.6

## 2021-02-16 MED ORDER — KETOROLAC TROMETHAMINE 30 MG/ML IJ SOLN
30.0000 mg | Freq: Four times a day (QID) | INTRAMUSCULAR | Status: DC | PRN
  Administered 2021-02-16: 30 mg via INTRAVENOUS

## 2021-02-16 MED ORDER — FENTANYL CITRATE (PF) 100 MCG/2ML IJ SOLN
INTRAMUSCULAR | Status: AC
  Filled 2021-02-16: qty 2

## 2021-02-16 MED ORDER — OXYTOCIN-SODIUM CHLORIDE 30-0.9 UT/500ML-% IV SOLN
INTRAVENOUS | Status: DC | PRN
  Administered 2021-02-16: 300 mL via INTRAVENOUS

## 2021-02-16 MED ORDER — SCOPOLAMINE 1 MG/3DAYS TD PT72
MEDICATED_PATCH | TRANSDERMAL | Status: AC
  Filled 2021-02-16: qty 1

## 2021-02-16 MED ORDER — DEXAMETHASONE SODIUM PHOSPHATE 10 MG/ML IJ SOLN
INTRAMUSCULAR | Status: DC | PRN
  Administered 2021-02-16: 10 mg via INTRAVENOUS

## 2021-02-16 MED ORDER — DEXMEDETOMIDINE (PRECEDEX) IN NS 20 MCG/5ML (4 MCG/ML) IV SYRINGE
PREFILLED_SYRINGE | INTRAVENOUS | Status: DC | PRN
  Administered 2021-02-16: 12 ug via INTRAVENOUS

## 2021-02-16 MED ORDER — SOD CITRATE-CITRIC ACID 500-334 MG/5ML PO SOLN
30.0000 mL | ORAL | Status: DC
  Filled 2021-02-16: qty 15

## 2021-02-16 MED ORDER — DIPHENHYDRAMINE HCL 25 MG PO CAPS: 25.0000 mg | ORAL_CAPSULE | Freq: Four times a day (QID) | ORAL | Status: DC | PRN

## 2021-02-16 MED ORDER — KETOROLAC TROMETHAMINE 30 MG/ML IJ SOLN: 30.0000 mg | Freq: Four times a day (QID) | INTRAMUSCULAR | Status: DC | PRN

## 2021-02-16 MED ORDER — COCONUT OIL OIL: 1.0000 "application " | TOPICAL_OIL | Status: DC | PRN

## 2021-02-16 MED ORDER — OXYTOCIN-SODIUM CHLORIDE 30-0.9 UT/500ML-% IV SOLN
INTRAVENOUS | Status: AC
  Filled 2021-02-16: qty 500

## 2021-02-16 MED ORDER — OXYCODONE HCL 5 MG PO TABS: 5.0000 mg | ORAL_TABLET | ORAL | Status: DC | PRN

## 2021-02-16 MED ORDER — IBUPROFEN 600 MG PO TABS
600.0000 mg | ORAL_TABLET | Freq: Four times a day (QID) | ORAL | Status: DC
  Administered 2021-02-17 – 2021-02-18 (×4): 600 mg via ORAL
  Filled 2021-02-16 (×4): qty 1

## 2021-02-16 MED ORDER — CEFAZOLIN SODIUM-DEXTROSE 2-4 GM/100ML-% IV SOLN
INTRAVENOUS | Status: AC
  Filled 2021-02-16: qty 100

## 2021-02-16 SURGICAL SUPPLY — 44 items
ADH SKN CLS APL DERMABOND .7 (GAUZE/BANDAGES/DRESSINGS) ×3
CHLORAPREP W/TINT 26ML (MISCELLANEOUS) ×2 IMPLANT
CLAMP CORD UMBIL (MISCELLANEOUS) IMPLANT
CLOTH BEACON ORANGE TIMEOUT ST (SAFETY) ×2 IMPLANT
DERMABOND ADVANCED (GAUZE/BANDAGES/DRESSINGS) ×3
DERMABOND ADVANCED .7 DNX12 (GAUZE/BANDAGES/DRESSINGS) ×2 IMPLANT
DRSG OPSITE POSTOP 4X10 (GAUZE/BANDAGES/DRESSINGS) ×2 IMPLANT
ELECT REM PT RETURN 9FT ADLT (ELECTROSURGICAL) ×2
ELECTRODE REM PT RTRN 9FT ADLT (ELECTROSURGICAL) ×1 IMPLANT
EXTRACTOR VACUUM BELL STYLE (SUCTIONS) IMPLANT
GLOVE BIOGEL PI IND STRL 7.0 (GLOVE) ×1 IMPLANT
GLOVE BIOGEL PI IND STRL 8 (GLOVE) ×1 IMPLANT
GLOVE BIOGEL PI INDICATOR 7.0 (GLOVE) ×1
GLOVE BIOGEL PI INDICATOR 8 (GLOVE) ×1
GLOVE ECLIPSE 8.0 STRL XLNG CF (GLOVE) ×2 IMPLANT
GOWN STRL REUS W/TWL LRG LVL3 (GOWN DISPOSABLE) ×4 IMPLANT
KIT ABG SYR 3ML LUER SLIP (SYRINGE) ×2 IMPLANT
NDL HYPO 18GX1.5 BLUNT FILL (NEEDLE) ×1 IMPLANT
NDL HYPO 25X5/8 SAFETYGLIDE (NEEDLE) ×1 IMPLANT
NEEDLE HYPO 18GX1.5 BLUNT FILL (NEEDLE) ×2 IMPLANT
NEEDLE HYPO 22GX1.5 SAFETY (NEEDLE) ×2 IMPLANT
NEEDLE HYPO 25X5/8 SAFETYGLIDE (NEEDLE) ×2 IMPLANT
NS IRRIG 1000ML POUR BTL (IV SOLUTION) ×2 IMPLANT
PACK C SECTION WH (CUSTOM PROCEDURE TRAY) ×2 IMPLANT
PAD OB MATERNITY 4.3X12.25 (PERSONAL CARE ITEMS) ×2 IMPLANT
PENCIL SMOKE EVAC W/HOLSTER (ELECTROSURGICAL) ×2 IMPLANT
RETRACTOR TRAXI PANNICULUS (MISCELLANEOUS) IMPLANT
RTRCTR C-SECT PINK 25CM LRG (MISCELLANEOUS) IMPLANT
SUT CHROMIC 0 CT 1 (SUTURE) ×2 IMPLANT
SUT MNCRL 0 VIOLET CTX 36 (SUTURE) ×2 IMPLANT
SUT MON AB 3-0 SH 27 (SUTURE) ×2
SUT MON AB 3-0 SH27 (SUTURE) IMPLANT
SUT MONOCRYL 0 CTX 36 (SUTURE) ×6
SUT PLAIN 2 0 (SUTURE)
SUT PLAIN 2 0 XLH (SUTURE) ×1 IMPLANT
SUT PLAIN ABS 2-0 CT1 27XMFL (SUTURE) IMPLANT
SUT VIC AB 0 CTX 36 (SUTURE) ×2
SUT VIC AB 0 CTX36XBRD ANBCTRL (SUTURE) ×1 IMPLANT
SUT VIC AB 4-0 KS 27 (SUTURE) IMPLANT
SYR 20CC LL (SYRINGE) ×4 IMPLANT
TOWEL OR 17X24 6PK STRL BLUE (TOWEL DISPOSABLE) ×2 IMPLANT
TRAXI PANNICULUS RETRACTOR (MISCELLANEOUS) ×1
TRAY FOLEY W/BAG SLVR 14FR LF (SET/KITS/TRAYS/PACK) IMPLANT
WATER STERILE IRR 1000ML POUR (IV SOLUTION) ×3 IMPLANT

## 2021-02-16 NOTE — Progress Notes (Addendum)
Patient rechecked given more uncomfortable and is now 7/80/-1, change from prior exam. Patient reports that if she has not made significant change by next exam, would like to request CS at that time, which I am supportive of. Will also start amnioinfusion now given intermittent variable decels.    Sharene Skeans, MD [facility] Fellow, [facility] for [facility], [place]

## 2021-02-16 NOTE — Transfer of Care (Signed)
Immediate Anesthesia Transfer of Care Note  Patient: Meredith Chen  Procedure(s) Performed: CESAREAN SECTION (N/A )  Patient Location: PACU  Anesthesia Type:Epidural  Level of Consciousness: awake, alert  and oriented  Airway & Oxygen Therapy: Patient Spontanous Breathing  Post-op Assessment: Report given to RN and Post -op Vital signs reviewed and stable  Post vital signs: Reviewed and stable  Last Vitals:  Vitals Value Taken Time  BP 122/99 02/16/21 0643  Temp    Pulse 82 02/16/21 0646  Resp 23 02/16/21 0646  SpO2 100 % 02/16/21 0646  Vitals shown include unvalidated device data.  Last Pain:  Vitals:   02/16/21 0318  TempSrc:   PainSc: 6          Complications: No complications documented.

## 2021-02-16 NOTE — Op Note (Signed)
Operative Note   SURGERY DATE: 02/16/2021  PRE-OP DIAGNOSIS:  *Pregnancy at [redacted]w[redacted]d *History of one prior cesarean section  *Undesired fertility *Arrest of dilation   POST-OP DIAGNOSIS: Same   PROCEDURE: Repeat low transverse cesarean section via pfannenstiel skin incision with double layer uterine closure and bilateral tubal ligation   SURGEON: Surgeon(s) and Role:    * Eure, Mertie Clause, MD - Primary    * Chao Blazejewski, Placido Sou, MD - Assisting  ANESTHESIA: epidural  ESTIMATED BLOOD LOSS: 1242  DRAINS: 100 mL UOP via indwelling foley  TOTAL IV FLUIDS: 1067mL crystalloid  VTE PROPHYLAXIS: SCDs to bilateral lower extremities  ANTIBIOTICS: 2 g ancef, 500 mg azithromycin, within 1 hour of skin incision  SPECIMENS: placenta to L&D   COMPLICATIONS: none  INDICATIONS: History of prior c section, arrest of dilation at 7 cm.   FINDINGS: No intra-abdominal adhesions were noted. Uterus appeared unlabored and also had possible Bondl's ring. Normal tubes and ovaries. Meconium stained amniotic fluid, cephalic female infant, weight pending, APGARs 9/9, intact placenta.  PROCEDURE IN DETAIL: The patient was taken to the operating room where anesthesia was administered and normal fetal heart tones were confirmed. She was then prepped and draped in the normal fashion in the dorsal supine position with a leftward tilt.  After a time out was performed, a pfannensteil  skin incision was made with the scalpel and carried through to the underlying layer of fascia. The fascia was then incised at the midline and this incision was extended laterally bluntly. The rectus muscles were dissected bluntly. The rectus muscles were then separated in the midline and the peritoneum was entered bluntly. The Alexis retractor was inserted.   Uterus did not appear labored and a suspected Bondl's ring was present. A low transverse hysterotomy was made with the scalpel until the endometrial cavity was breached and the amniotic  sac ruptured with the Allis clamp, yielding meconium stained amniotic fluid. This incision was extended bluntly and the infant's head, shoulders and body were delivered atraumatically.The cord was clamped x 2 and cut, and the infant was handed to the awaiting pediatricians, after delayed cord clamping was done.  The placenta was then gradually expressed from the uterus and then the uterus was exteriorized and cleared of all clots and debris. 1 gram TXA given for brisk bleeding. The hysterotomy was repaired with a running suture of 0 monocryl. A second imbricating layer of 0 monocryl suture was then placed. A few figure-of-eight sutures of monocryl were added to achieve excellent hemostasis.   Pomery: The right Fallopian tube was identified, grasped with the Babcock clamps. An avascular midsection of the tube approximately 3-4cm from the cornua was grasped with the babcock clamps and brought into a knuckle. The tube was double ligated with one 0 plain gut suture and the intervening portion of tube was transected and removed.  Attention was then turned to the left fallopian tube after confirmation of identification by tracing the tube out to the fimbriae. The same procedure was then performed on the right Fallopian tube, with excellent hemostasis noted at both BTL sites at the end of the procedure.     The uterus and adnexa were then returned to the abdomen, and the hysterotomy and all operative sites were reinspected and excellent hemostasis was noted.  The fascia was reapproximated with 0 Vicryl in a simple running fashion bilaterally. The subcutaneous layer was then reapproximated with interrupted sutures of 2-0 plain gut, and the skin was then closed with 4-0 vicryl,  in a subcuticular fashion. Dermabond was placed over incision.  The patient  tolerated the procedure well. Sponge, lap, needle, and instrument counts were correct x 2. The patient was transferred to the recovery room awake, alert and breathing  independently in stable condition.   Sharene Skeans, MD [facility] Family Medicine Fellow, [facility] for [facility], [place]

## 2021-02-16 NOTE — Progress Notes (Signed)
Strip Review Progress Note Meredith Chen is a 40 y.o. [phone] at [redacted]w[redacted]d presented for TOLAC, IOL-CHTN  S: Pt asleep   O:  BP 111/66   Pulse 79   Temp 98.1 F (36.7 C) (Oral)   Resp 16   Ht 5\' 9"  (1.753 m)   Wt 114.8 kg   LMP 06/01/2020   SpO2 97%   BMI 37.38 kg/m  EFM: 130 bpm/moderate to minimal variability/positive accelerations/no decels Toco:  q3-4 min, not adequate by IUPC   CVE: Dilation: 6 Effacement (%): 70 Station: -1 Presentation: Vertex Exam by:: Sharene Skeans, MD   A&P: 40 y.o. [id number] [redacted]w[redacted]d here for TOLAC, IOL-CHTN #Labor: S/p FB ([id number]). Pitocin started 0115. AROM for light meconium stained fluid at 1300 and patient called 7 cm at this time. Pitocin break from 1730-2130. Rechecked and baby feels poorly engaged, OP on exam and more like 6 cm. Pitocin restarted at 2130, currently at 10. Defer cervical exam given patient with inadequate contractions/not feeling contractions. Dr. Elonda Husky updated on patient. Will recheck when patient in better labor pattern.   #Pain: epidural in place #FWB: Category 1 strip #GBS negative  #CHTN: Labetalol 300 mg BID. Blood pressures normal to mild range. PreE labs wnl. Asymptomatic. CTM. #MOF: Both #MOC: PP BTL (consent signed 12/07/20) #Circ: N/A #Anemia of pregnancy: hgb 10.1 on admission. Supplement PO iron every other day.   [name], MD 12:41 AM

## 2021-02-16 NOTE — Plan of Care (Signed)

## 2021-02-16 NOTE — Anesthesia Postprocedure Evaluation (Signed)
Anesthesia Post Note  Patient: Meredith Chen  Procedure(s) Performed: CESAREAN SECTION (N/A )     Patient location during evaluation: PACU Anesthesia Type: Epidural Level of consciousness: oriented and awake and alert Pain management: pain level controlled Vital Signs Assessment: post-procedure vital signs reviewed and stable Respiratory status: spontaneous breathing, respiratory function stable and nonlabored ventilation Cardiovascular status: blood pressure returned to baseline and stable Postop Assessment: no headache, no backache, no apparent nausea or vomiting and epidural receding Anesthetic complications: no   No complications documented.  Last Vitals:  Vitals:   02/16/21 1430 02/16/21 1715  BP:    Pulse:    Resp: 18 18  Temp: 36.9 C 36.7 C  SpO2: 96%     Last Pain:  Vitals:   02/16/21 1730  TempSrc:   PainSc: 0-No pain   Pain Goal:                Epidural/Spinal Function Cutaneous sensation: Normal sensation (02/16/21 1730), Patient able to flex knees: Yes (02/16/21 1730), Patient able to lift hips off bed: Yes (02/16/21 1730), Back pain beyond tenderness at insertion site: No (02/16/21 1730), Progressively worsening motor and/or sensory loss: No (02/16/21 1730), Bowel and/or bladder incontinence post epidural: No (02/16/21 1730)  Lidia Collum

## 2021-02-16 NOTE — Progress Notes (Signed)
Called to bedside per patient request to recheck cervix. Patient is still 7/80/-1, no change from prior exam. She is contraction q1-2 min but without adequate MVU's. She requests to proceed with cesarean section given no change in cervix. Agree with patient given minimal to no change in cervix since noon on 5/4. Will proceed with repeat LTCS for arrest of dilation. Patient also desires BTL. Dr. Elonda Husky notified.   The risks of cesarean section were discussed with the patient including but were not limited to: bleeding which may require transfusion or reoperation; infection which may require antibiotics; injury to bowel, bladder, ureters or other surrounding organs; injury to the fetus; need for additional procedures including hysterectomy in the event of a life-threatening hemorrhage; formation of adhesions; placental abnormalities wth subsequent pregnancies; incisional problems; thromboembolic phenomenon and other postoperative/anesthesia complications.  Patient also desires permanent sterilization.  Other reversible forms of contraception were discussed with patient; she declines all other modalities. This will be done either via bilateral salpingectomy or bilateral application of Filshie clips.  Risks of procedure discussed with patient including but not limited to: risk of regret, permanence of method, bleeding, infection, injury to surrounding organs and need for additional procedures.  Failure risk of about 1% with increased risk of ectopic gestation if pregnancy occurs was also discussed with patient.  Also discussed possibility of post-tubal pain syndrome. The patient concurred with the proposed plan, giving informed written consent for the procedures.  She will remain NPO for procedure. Anesthesia and OR aware.  Preoperative prophylactic antibiotics and SCDs ordered on call to the OR.  To OR when ready.   Sharene Skeans, MD [facility] Family Medicine Fellow, [facility] for [facility], [place]

## 2021-02-16 NOTE — Progress Notes (Signed)
Patient has been unable to stand long enough for ortho VS x2 due to being dizzy, getting hot and nauseated. Resolves when sitting and laying down. All VS, bleeding, and urine output have been WNL. Dr. Astrid Drafts notified and ordered a CBC for now. Will call with results. Timoteo Ace, RN

## 2021-02-16 NOTE — Discharge Summary (Signed)
Postpartum Discharge Summary     Patient Name: Meredith Chen DOB: April 02, 1981 MRN: [id number]  Date of admission: 02/15/2021 Delivery date:02/16/2021  Delivering provider: Florian Buff  Date of discharge: 02/18/2021  Admitting diagnosis: Chronic hypertension affecting pregnancy [O10.919] Intrauterine pregnancy: [redacted]w[redacted]d    Secondary diagnosis:  Active Problems:   Anemia   H/O cesarean section complicating pregnancy   Chronic hypertension affecting pregnancy   Supervision of high risk pregnancy, antepartum   AMA (advanced maternal age) multigravida 35+   History of 2019 novel coronavirus disease (COVID-19)   Obesity affecting pregnancy   Unwanted fertility   Cesarean delivery delivered  Additional problems: none    Discharge diagnosis: Term Pregnancy Delivered, CHTN and Anemia                                              Post partum procedures:postpartum tubal ligation, IV iron infusion  Augmentation: AROM, Pitocin and IP Foley Complications: None  Hospital course: Induction of Labor With Cesarean Section   40y.o. yo [id number] admitted to the hospital 02/15/2021 for induction of labor. Patient had a labor course significant for arresting at 7 cm of dilation. The patient went for cesarean section due to Arrest of Dilation. Delivery details are as follows: Membrane Rupture Time/Date: 12:40 PM ,02/15/2021   Delivery Method:C-Section, Low Transverse  Details of operation can be found in separate operative Note. She was started on 5 mg norvasc post-operatively. She received IV iron and was started on PO iron. Patient had an uncomplicated postpartum course. She is ambulating, tolerating a regular diet, passing flatus, and urinating well.  Patient is discharged home in stable condition on 02/18/21.      Newborn Data: Birth date:02/16/2021  Birth time:5:42 AM  Gender:Female  Living status:Living  Apgars:9 ,9  Weight:3100 g                                 Magnesium Sulfate  received: No BMZ received: No Rhophylac:N/A MMR:N/A T-DaP:Given prenatally Flu: Yes Transfusion:Yes (IV Iron)  Physical exam  Vitals:   02/17/21 1415 02/17/21 2131 02/17/21 2357 02/18/21 0553  BP: 125/70 (!) 144/80 135/86 137/82  Pulse: 70 72 76 75  Resp: _0 Temp: 98.5 F (36.9 C) 98.5 F (36.9 C)  98.7 F (37.1 C)  TempSrc: Oral Oral  Oral  SpO2:  98%  99%  Weight:      Height:       General: alert, cooperative and no distress Lochia: appropriate Uterine Fundus: firm Incision: Healing well with no significant drainage DVT Evaluation: No evidence of DVT seen on physical exam. Labs: Lab Results  Component Value Date   WBC 11.7 (H) 02/17/2021   HGB 7.0 (L) 02/17/2021   HCT 21.4 (L) 02/17/2021   MCV 84.6 02/17/2021   PLT 231 02/17/2021   CMP Latest Ref Rng & Units 02/15/2021  Glucose 70 - 99 mg/dL 112(H)  BUN 6 - 20 mg/dL 10  Creatinine 0.44 - 1.00 mg/dL 0.68  Sodium 135 - 145 mmol/L 137  Potassium 3.5 - 5.1 mmol/L 3.4(L)  Chloride 98 - 111 mmol/L 110  CO2 22 - 32 mmol/L 19(L)  Calcium 8.9 - 10.3 mg/dL 9.4  Total Protein 6.5 - 8.1 g/dL 7.2  Total  Bilirubin 0.3 - 1.2 mg/dL 0.4  Alkaline Phos 38 - 126 U/L 133(H)  AST 15 - 41 U/L 19  ALT 0 - 44 U/L 27   Edinburgh Score: Edinburgh Postnatal Depression Scale Screening Tool 02/16/2021  I have been able to laugh and see the funny side of things. (No Data)     After visit meds:  Allergies as of 02/18/2021   No Known Allergies     Medication List    STOP taking these medications   aspirin 81 MG chewable tablet   labetalol 300 MG tablet Commonly known as: NORMODYNE     TAKE these medications   acetaminophen 500 MG tablet Commonly known as: TYLENOL Take 2 tablets (1,000 mg total) by mouth every 8 (eight) hours.   amLODipine 5 MG tablet Commonly known as: NORVASC Take 1 tablet (5 mg total) by mouth daily.   Blood Pressure Kit Devi 1 Device by Does not apply route as needed.   coconut oil  Oil Apply 1 application topically as needed.   ferrous sulfate 325 (65 FE) MG tablet Take 1 tablet (325 mg total) by mouth every other day.   ibuprofen 600 MG tablet Commonly known as: ADVIL Take 1 tablet (600 mg total) by mouth every 6 (six) hours.   oxyCODONE 5 MG immediate release tablet Commonly known as: Oxy IR/ROXICODONE Take 1-2 tablets (5-10 mg total) by mouth every 4 (four) hours as needed for moderate pain.   polyethylene glycol 17 g packet Commonly known as: MiraLax Take 17 g by mouth daily.   PRENATAL VITAMINS PO Take 1 tablet by mouth daily.            Discharge Care Instructions  (From admission, onward)         Start     Ordered   02/18/21 0000  Discharge wound care:       Comments: Remove wound dressing around Post-operative days 5-7.   02/18/21 0658           Discharge home in stable condition Infant Feeding: Breast Infant Disposition:home with mother Discharge instruction: per After Visit Summary and Postpartum booklet. Activity: Advance as tolerated. Pelvic rest for 6 weeks.  Diet: routine diet Future Appointments: Future Appointments  Date Time Provider Department Center  02/24/2021  9:00 AM [facility] NURSE [facility]  03/06/2021 10:35 AM Burleson, Terri L, NP [facility]   Follow up Visit:   Please schedule this patient for a In person postpartum visit in 4 weeks with the following provider: MD. Additional Postpartum F/U:Incision check 1 week and BP check 1 week  High risk pregnancy complicated by: cHTN Delivery mode:  C-Section, Low Transverse  Anticipated Birth Control:  BTL done PP   02/18/2021 Julia M Marsala, MD   

## 2021-02-17 ENCOUNTER — Encounter (HOSPITAL_COMMUNITY): Payer: Self-pay | Admitting: Obstetrics & Gynecology

## 2021-02-17 LAB — BIRTH TISSUE RECOVERY COLLECTION (PLACENTA DONATION)

## 2021-02-17 LAB — CBC
HCT: 21.4 % — ABNORMAL LOW (ref 36.0–46.0)
Hemoglobin: 7 g/dL — ABNORMAL LOW (ref 12.0–15.0)
MCH: 27.7 pg (ref 26.0–34.0)
MCHC: 32.7 g/dL (ref 30.0–36.0)
MCV: 84.6 fL (ref 80.0–100.0)
Platelets: 231 10*3/uL (ref 150–400)
RBC: 2.53 MIL/uL — ABNORMAL LOW (ref 3.87–5.11)
RDW: 20.3 % — ABNORMAL HIGH (ref 11.5–15.5)
WBC: 11.7 10*3/uL — ABNORMAL HIGH (ref 4.0–10.5)
nRBC: 0 % (ref 0.0–0.2)

## 2021-02-17 MED ORDER — SODIUM CHLORIDE 0.9 % IV SOLN
500.0000 mg | Freq: Once | INTRAVENOUS | Status: AC
  Administered 2021-02-17: 500 mg via INTRAVENOUS
  Filled 2021-02-17: qty 25

## 2021-02-17 MED ORDER — FERROUS SULFATE 325 (65 FE) MG PO TABS: 325.0000 mg | ORAL_TABLET | ORAL | Status: DC

## 2021-02-17 MED ORDER — SODIUM CHLORIDE 0.9 % IV SOLN
INTRAVENOUS | Status: AC
  Filled 2021-02-17: qty 500

## 2021-02-17 MED ORDER — CEFAZOLIN IN SODIUM CHLORIDE 3-0.9 GM/100ML-% IV SOLN
INTRAVENOUS | Status: AC
  Filled 2021-02-17: qty 100

## 2021-02-17 MED ORDER — FENTANYL CITRATE (PF) 100 MCG/2ML IJ SOLN
INTRAMUSCULAR | Status: AC
  Filled 2021-02-17: qty 2

## 2021-02-17 MED ORDER — OXYTOCIN-SODIUM CHLORIDE 30-0.9 UT/500ML-% IV SOLN
INTRAVENOUS | Status: AC
  Filled 2021-02-17: qty 1000

## 2021-02-17 MED ORDER — MORPHINE SULFATE (PF) 0.5 MG/ML IJ SOLN
INTRAMUSCULAR | Status: AC
  Filled 2021-02-17: qty 10

## 2021-02-17 MED ORDER — ONDANSETRON HCL 4 MG/2ML IJ SOLN
INTRAMUSCULAR | Status: AC
  Filled 2021-02-17: qty 2

## 2021-02-17 MED ORDER — ASCORBIC ACID 500 MG PO TABS: 250.0000 mg | ORAL_TABLET | ORAL | Status: DC

## 2021-02-17 NOTE — Lactation Note (Signed)
This note was copied from a baby's chart. Lactation Consultation Note  Patient Name: Meredith Chen Today's Date: 02/17/2021 Reason for consult: Initial assessment;Early term 37-38.6wks Age:39 hours   P6 mother whose infant is now 80 hours old.  This is an ETI at 38+3 weeks.  Mother breast fed her other children (now 4, 66, 64 and 75 year old twins) for  times ranging between 2 weeks-2 years.  Mother's current feeding preference is breast/bottle.  Baby was asleep in the bassinet when I arrived.  Mother concerned that her baby is not "getting enough."  Reviewed breast feeding basics with mother.  She has been able to observe a couple of colostrum drops with hand expression.  Asked her to do breast massage and hand expression often and to feed back any EBM she obtains to baby.  Mother has been supplementing with large volumes of formula.  The last two feedings have been 35 mls and 43 mls.  Educated mother on infant's tummy size and volume expectations after breast feeding.  Reminded mother to always breast feed prior to supplementation.  Reassured mother that the progression of colostrum to a full milk supply usually takes 3-5 days.    Encouraged to feed 8-12 times/24 hours or sooner if baby shows cues.  She will call her RN/LC for latch assistance as needed.    Mother is a [facility] participant in [facility] and has plans to contact the office on Monday to obtain a DEBP.  She has used this service in the past.  Offered to put a quiet sign on mother's door.  She verbally confirmed her exhaustion level.  No support person present.  RN notified.     Maternal Data Has patient been taught Hand Expression?: Yes Does the patient have breastfeeding experience prior to this delivery?: Yes How long did the patient breastfeed?: 2 weeks- 2 years with her other children  Feeding Mother's Current Feeding Choice: Breast Milk and Formula Nipple Type: Slow - flow  LATCH Score Latch: Grasps breast  easily, tongue down, lips flanged, rhythmical sucking.  Audible Swallowing: A few with stimulation  Type of Nipple: Everted at rest and after stimulation  Comfort (Breast/Nipple): Soft / non-tender  Hold (Positioning): Assistance needed to correctly position infant at breast and maintain latch.  LATCH Score: 8   Lactation Tools Discussed/Used    Interventions    Discharge Pump: Personal WIC Program: Yes  Consult Status Consult Status: Follow-up Date: 02/18/21 Follow-up type: In-patient    Meredith Chen R Jalilah Wiltsie 02/17/2021, 12:19 PM

## 2021-02-17 NOTE — Progress Notes (Addendum)
POSTPARTUM PROGRESS NOTE  Subjective: Meredith Chen is a 40 y.o. [id number] s/p LTCS at [redacted]w[redacted]d.  POD#1. She reports she doing well. No acute events overnight. She denies any problems with ambulating, voiding or po intake. Denies nausea or vomiting. She has passed flatus. Pain is well controlled.  Lochia is minimal.  Objective: Blood pressure 137/61, pulse 82, temperature 98.9 F (37.2 C), temperature source Oral, resp. rate 18, height 5\' 9"  (1.753 m), weight 114.8 kg, last menstrual period 06/01/2020, SpO2 96 %, unknown if currently breastfeeding.  Physical Exam:  General: alert, cooperative and no distress Chest: no respiratory distress Abdomen: soft, non-tender  Uterine Fundus: firm and at level of umbilicus, incision site c/d/i  Extremities: No calf swelling or tenderness  edema  Recent Labs    02/16/21 1743 02/17/21 0518  HGB 8.0* 7.0*  HCT 24.9* 21.4*    Assessment/Plan: Meredith Chen is a 40 y.o. [id number] s/p LTCS at [redacted]w[redacted]d for attempted TOLAC with arrested dilation.  Routine Postpartum Care: Doing well, pain well-controlled.  -- Continue routine care, lactation support  -- Acute blood loss anemia: consented for IV venofer. Hgb 7.0 but asymptomatic.  -- Contraception: PP BTL -- Feeding: Both  Dispo: Plan for discharge 5/7.  Delora Fuel, MD 02/17/2021 8:45 AM   I saw and evaluated the patient. I agree with the findings and the plan of care as documented in the resident's note. I have made edits as necessary above.   Sharene Skeans, MD [facility] Family Medicine Fellow, [facility] for [facility], [place]

## 2021-02-18 MED ORDER — COCONUT OIL OIL: 1.0000 "application " | TOPICAL_OIL | 0 refills | Status: AC | PRN

## 2021-02-18 MED ORDER — ACETAMINOPHEN 500 MG PO TABS: 1000.0000 mg | ORAL_TABLET | Freq: Three times a day (TID) | ORAL | 0 refills | Status: AC

## 2021-02-18 MED ORDER — FERROUS SULFATE 325 (65 FE) MG PO TABS: 325.0000 mg | ORAL_TABLET | ORAL | 3 refills | Status: AC

## 2021-02-18 MED ORDER — OXYCODONE HCL 5 MG PO TABS: 5.0000 mg | ORAL_TABLET | ORAL | 0 refills | Status: AC | PRN

## 2021-02-18 MED ORDER — AMLODIPINE BESYLATE 5 MG PO TABS
5.0000 mg | ORAL_TABLET | Freq: Every day | ORAL | Status: DC
  Administered 2021-02-18: 5 mg via ORAL
  Filled 2021-02-18: qty 1

## 2021-02-18 MED ORDER — IBUPROFEN 600 MG PO TABS: 600.0000 mg | ORAL_TABLET | Freq: Four times a day (QID) | ORAL | 0 refills | Status: AC

## 2021-02-18 MED ORDER — POLYETHYLENE GLYCOL 3350 17 G PO PACK: 17.0000 g | PACK | Freq: Every day | ORAL | 0 refills | Status: AC

## 2021-02-18 MED ORDER — AMLODIPINE BESYLATE 5 MG PO TABS
5.0000 mg | ORAL_TABLET | Freq: Every day | ORAL | 0 refills | Status: AC
Start: 2021-02-18 — End: ?

## 2021-02-18 NOTE — Discharge Instructions (Signed)

## 2021-02-18 NOTE — Lactation Note (Signed)
This note was copied from a baby's chart. Lactation Consultation Note  Patient Name: Meredith Chen [id number] Date: 02/18/2021 Reason for consult: Follow-up assessment Age:39 hours   P6 mother whose infant is now 73 hours old.  This is an ETI at 38+3 weeks.  Mother breast fed her other children (now 78, 19, 62 and 77 year old twins) for times ranging between 2 weeks-2 years.  Mother's current feeding preference is breast/bottle.  Mother had no questions/concerns related to breast feeding.  Last LATCH score was an 8.  Baby has been voiding/stooling well.  Engorgement prevention/treatment reviewed.  Manual pump provided.  Mother was not interested in reviewing pump set up.  She has our OP phone number for any questions after discharge.  Father present.  RN notified of family's desire to be discharged.   Maternal Data    Feeding    LATCH Score                    Lactation Tools Discussed/Used    Interventions    Discharge Discharge Education: Engorgement and breast care  Consult Status Consult Status: Complete Date: 02/18/21 Follow-up type: Call as needed    [name] [name] 02/18/2021, 2:02 PM

## 2021-02-20 LAB — SURGICAL PATHOLOGY

## 2021-02-21 ENCOUNTER — Telehealth: Payer: Self-pay

## 2021-02-21 NOTE — Telephone Encounter (Addendum)
Received a message from [facility], [facility] Nurse, that pt was discharged home from delivery on 02/16/21. She stated that pt was suppose to stop taking her Labetalol and start taking Norvasc however the pt went to the pharmacy and was informed that they did have the Norvasc nor Oxycodone prescription.  Called pt's pharmacy and first was informed that they did not have the medication, I was placed on hold, pharmacy tech returned stating "o, no we have her medications- Norvasc and Oxycodone and she can come pick them."  I requested that the pharmacy call the patient to inform her.  Pharmacy stated yes they will call her.  Notified pt that pharmacy will contact her about coming to pick up BP and pain medication.  I requested to please go ahead and start taking as she has a BP check appt scheduled on 02/24/21.  Pt verbalized understanding and no further questions.   Frances Nickels  02/22/21

## 2021-02-22 ENCOUNTER — Telehealth: Payer: Self-pay | Admitting: *Deleted

## 2021-02-22 NOTE — Telephone Encounter (Signed)
Message left by [facility] Nurse. She thanked our office for clearing up the problem with pt's prescription and stated that pt has not yet picked up the medication (Amlodipine). She also said that she had been to see pt in her home today and BP check 134/90. Pt has appt in office on 5/13 for nurse BP check @ 9:00 am and pt states this is too early. Izora Gala stated that she can go to pt's home for BP check that Demeka Sutter and then report results to our office if we would like. She requested a call back

## 2021-02-23 NOTE — Telephone Encounter (Signed)
Called patient to offer for her to come in this afternoon at 3:20 for BP check and would check. Patient did not answer. LM for her to call the office ASAP to let us know if she can come in today.   My Chart message sent.   Called Nikki at [facility] to inform her that I have called patient to see if she can come in today at 3:20 for a nurse visit for wound check and BP check. She will reach out to patient also.

## 2021-02-24 ENCOUNTER — Telehealth: Payer: Self-pay | Admitting: Lactation Services

## 2021-02-24 ENCOUNTER — Encounter: Payer: Self-pay | Admitting: Lactation Services

## 2021-02-24 ENCOUNTER — Ambulatory Visit (INDEPENDENT_AMBULATORY_CARE_PROVIDER_SITE_OTHER): Payer: Medicaid Other | Admitting: Lactation Services

## 2021-02-24 ENCOUNTER — Other Ambulatory Visit: Payer: Self-pay

## 2021-02-24 DIAGNOSIS — O34219 Maternal care for unspecified type scar from previous cesarean delivery: Secondary | ICD-10-CM

## 2021-02-24 NOTE — Telephone Encounter (Signed)
Meredith Chen, Prenatal Navigator was able to reach patient by phone, Patient was offered to come in this afternoon or in the morning for appointment. She was informed that she does need to be seen in the office for a would check and BP.   Patient agreeable to coming in at 9 am on 5/13 with her mother assisting her.

## 2021-02-24 NOTE — Progress Notes (Signed)
Chart reviewed for nurse visit. Agree with plan of care.   Luvenia Redden, PA-C 02/24/2021 11:34 AM

## 2021-02-24 NOTE — Progress Notes (Signed)
Patient here for BP check and wound check.   Patient reports that pain to incision comes and goes. She has more pain on the left side of the incision. She is taking Tylenol, Ibuprofen and Oxycodone as needed. She reports she is feeling overall better today.   Honeycomb dressing removed form incision. Dressing with small amount old dry blood. Incision clean dry and well approximated without redness or swelling. Patient with glue in place over incision. Reviewed not pulling on glue and let if fall off naturally. Reviewed washing abdomen with soap and allowing to run over incision with only patting incision dry, no rubbing. Reviewed keeping area dry throughout day as needed under [place] by blotting dry with soft cloth or paper towel   Patient started her BP medication. She has had some headache, blurred vision or dizziness at times, it is less that it has been and she feels better today. She reports it was last on Wednesday. She started taking her Norvasc yesterday. She has a BP cuff at home. BP today 123/80. She is without headache, blurred vision or dizziness.    Reviewed if is HA is not decreased with Tylenol or accompanied by blurred vision , dizziness or BP > 140/90, she is to go to [facility]U for evaluation.   Patient with no questions or concerns at this time. She has follow up PP appt in 2 weeks.

## 2021-03-02 ENCOUNTER — Encounter: Payer: Self-pay | Admitting: Obstetrics and Gynecology

## 2021-03-02 NOTE — Progress Notes (Signed)
Received call from [place]. Patient with intermittent headache, currently none. Taking norvasc as prescribed and did take it today. Home BP 130/90.   Advised to keep appt for 5/23, keep taking meds as prescribed. To MAU with prolonged/non-improving or worsening headache. RN verbalized understanding.   Feliz Beam, MD, [place] for [employer] [facility])

## 2021-03-06 ENCOUNTER — Ambulatory Visit: Payer: Self-pay | Admitting: Nurse Practitioner

## 2021-04-10 ENCOUNTER — Encounter: Payer: Self-pay | Admitting: *Deleted

## 2021-04-11 ENCOUNTER — Ambulatory Visit (INDEPENDENT_AMBULATORY_CARE_PROVIDER_SITE_OTHER): Payer: Medicaid Other

## 2021-04-11 ENCOUNTER — Other Ambulatory Visit: Payer: Self-pay

## 2021-04-11 DIAGNOSIS — I10 Essential (primary) hypertension: Secondary | ICD-10-CM | POA: Diagnosis not present

## 2021-04-11 MED ORDER — ENALAPRIL MALEATE 5 MG PO TABS: 5.0000 mg | ORAL_TABLET | Freq: Every day | ORAL | 2 refills | Status: AC

## 2021-04-11 MED ORDER — ENALAPRIL MALEATE 5 MG PO TABS: 5.0000 mg | ORAL_TABLET | Freq: Every day | ORAL | 2 refills | Status: DC

## 2021-04-11 NOTE — Progress Notes (Signed)
[facility] Part[facility] Visit Note  Meredith Chen is a 40 y.o. [id number] female who presents for a postpartum visit. She is 7 weeks postpartum following a repeat cesarean section.  I have fully reviewed the prenatal and intrapartum course. The delivery was at 38.3 gestational weeks.  Anesthesia: epidural. Postpartum course has been uneventful for the most part. Patient reports that she was discharged home on Norvasc 5mg  but stopped taking it because she was told she no longer needed to continue taking it. She reports occasional headaches which resolve with Tylenol. She does not currently have a headache. She does however endorse blurry vision that has been ongoing since pregnancy, however has worsened since the baby was born. Baby is doing well. Baby is feeding by both breast and bottle - Enfamil Neuropro . Bleeding no bleeding. Bowel function is normal. Bladder function is normal. Incision healing well. Patient is not sexually active. Contraception method is tubal ligation. Postpartum depression screening: negative.   The pregnancy intention screening data noted above was reviewed.    Health Maintenance Due  Topic Date Due   COVID-19 Vaccine (1) Never done   PAP SMEAR-Modifier  02/03/2016    The following portions of the patient's history were reviewed and updated as appropriate: allergies, current medications, past family history, past medical history, past social history, past surgical history, and problem list.  Review of Systems Pertinent items are noted in HPI.  Objective:  BP (!) 154/109   Pulse 80   Wt 224 lb (101.6 kg)   Breastfeeding Yes   BMI 33.08 kg/m    General:  alert, cooperative, and no distress   Breasts:  normal  Lungs: Effort normal  Heart:  Normal rate  Abdomen: soft, non-tender; bowel sounds normal; no masses,  no organomegaly   Wound Healed, well approximated incision  GU exam:  not indicated       Assessment:   1. Postpartum care and examination 2. Chronic  hypertension   Plan:   Essential components of care per ACOG recommendations:  1.  Mood and well being: Patient with negative depression screening today. Reviewed local resources for support.  - Patient tobacco use? No.   - hx of drug use? No.    2. Infant care and feeding:  -Patient currently breastmilk feeding? Yes. Discussed returning to work and pumping. Reviewed importance of draining breast regularly to support lactation.  -Social determinants of health (SDOH) reviewed in EPIC. No concerns  3. Sexuality, contraception and birth spacing - Patient does not want a pregnancy in the next year. Desired family size is 6 children.  - Patient had bilateral tubal ligation during hospital stay  4. Sleep and fatigue -Encouraged family/partner/community support of 4 hrs of uninterrupted sleep to help with mood and fatigue  5. Physical Recovery  - Discussed patients delivery and complications. She describes her labor as good. - Patient had a C-section failure to progress.  - Patient has urinary incontinence? No. - Patient is safe to resume physical and sexual activity  6.  Health Maintenance - HM due items addressed Yes - Last pap smear: 2014. Patient was offered pap smear today, but does not want to have done today as she has somewhere to be following this appointment. She is following up for BP check in 2 weeks and is requesting to have pap done at that visit -Breast Cancer screening indicated? No.   7. Chronic Disease/Pregnancy Condition follow up: Hypertension - cHTN: BP 158/110 initially. Repeat BP was 154/109. Patient  asymptomatic. Will start Enalapril 5mg  and have patient return for BP check in 2 weeks.  - Instructed patient to find PCP to further manage cHTN. List of PCP provided to patient - Also encouraged patient to have eye exam    Renee Harder, CNM 04/12/21 2:10 PM

## 2021-05-02 ENCOUNTER — Ambulatory Visit: Payer: Medicaid Other | Admitting: Family Medicine

## 2021-09-24 IMAGING — US US MFM FETAL BPP W/O NON-STRESS
1 series · 15 of 28 positions shown · non-contrast
Comparison: none

[Series 1: us mfm fetal bpp w/o non-stress · 33 acquisitions, 15 frames shown]
[im 1/33]
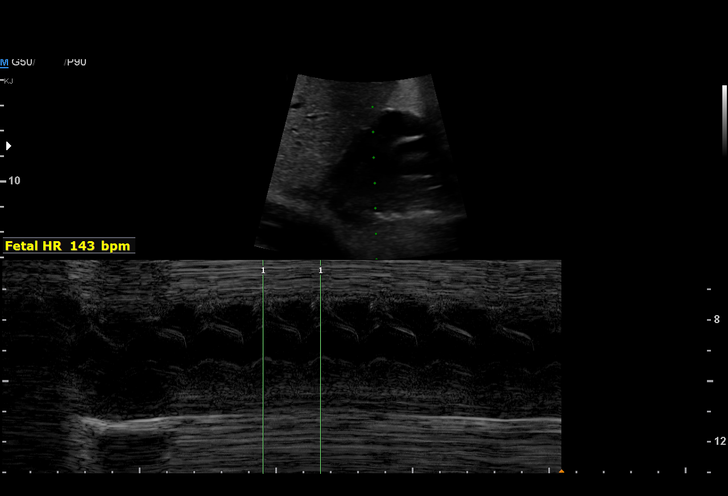
[im 3/33]
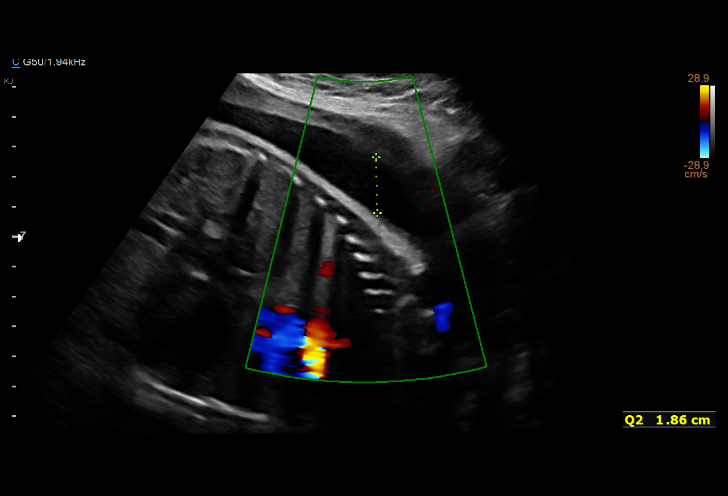
[im 5/33]
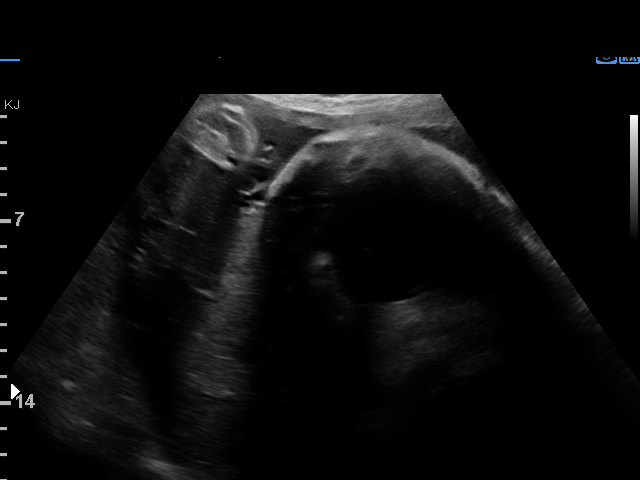
[im 8/33]
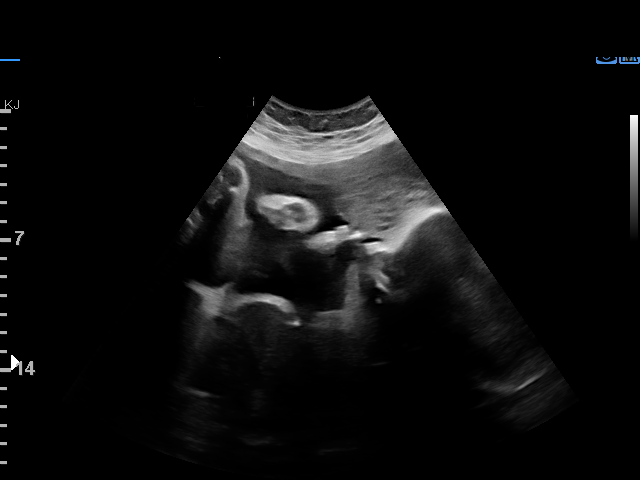
[im 10/33]
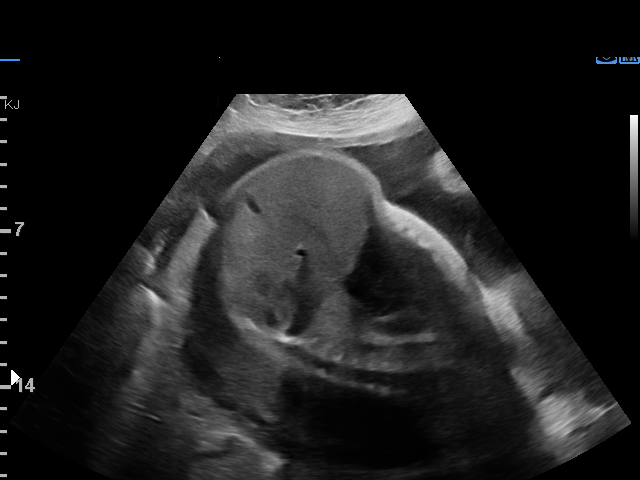
[im 12/33]
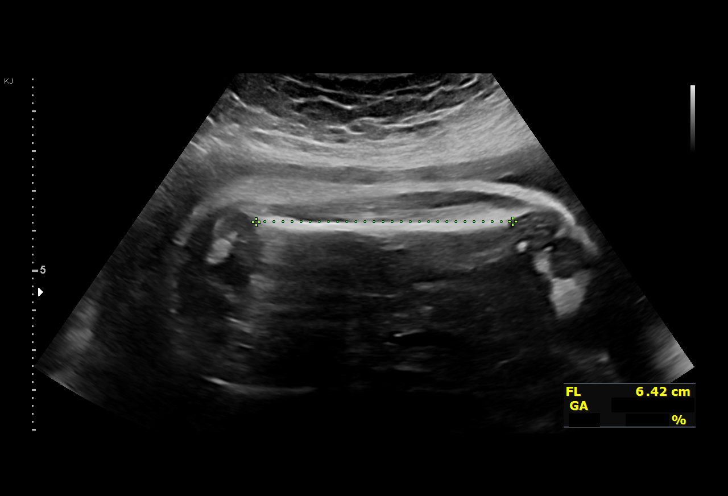
[im 15/33]
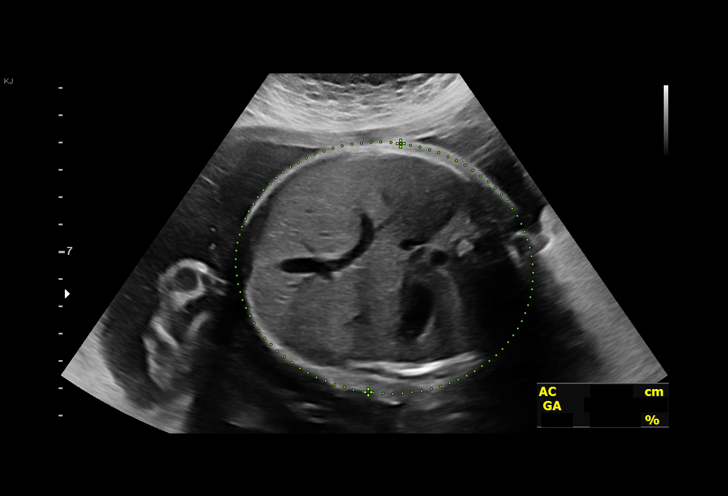
[im 17/33]
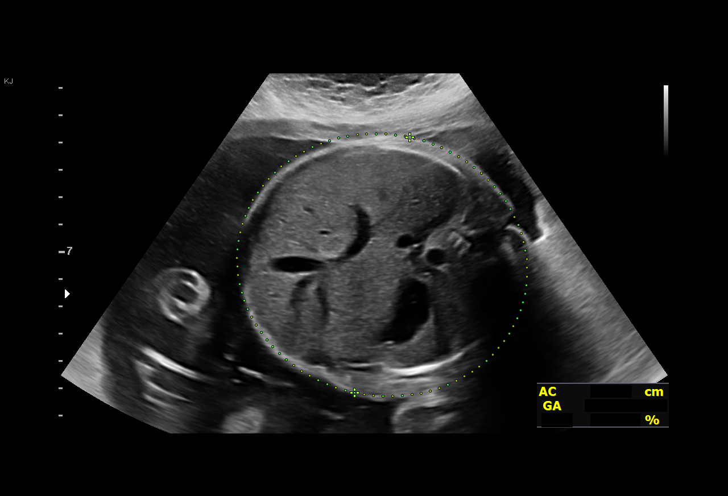
[im 18/33]
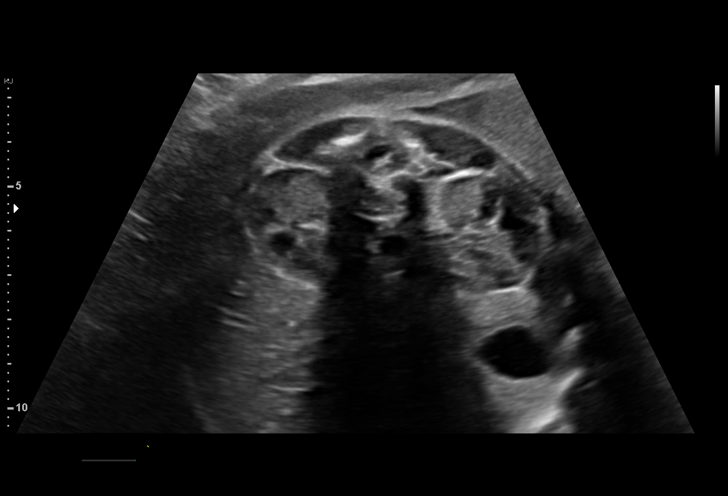
[im 21/33]
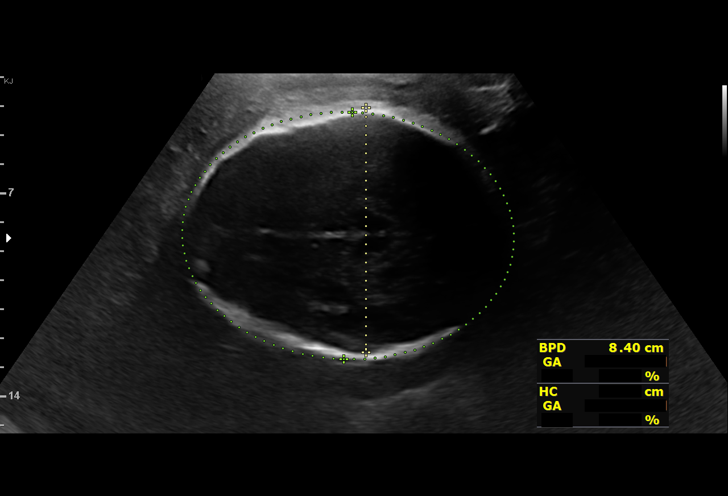
[im 23/33]
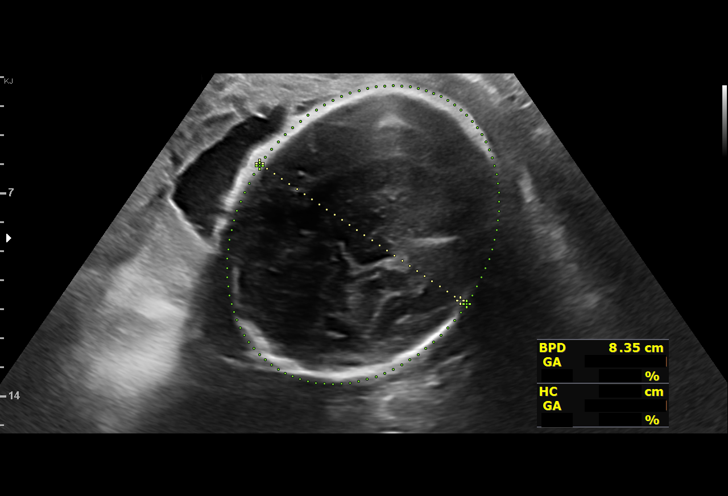
[im 25/33]
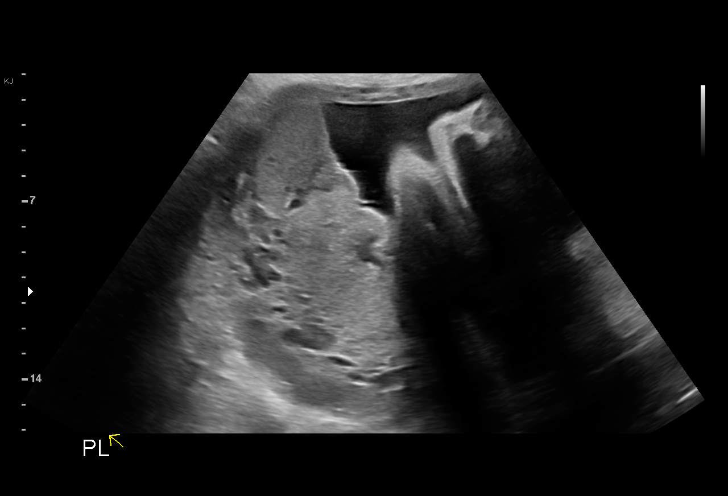
[im 28/33]
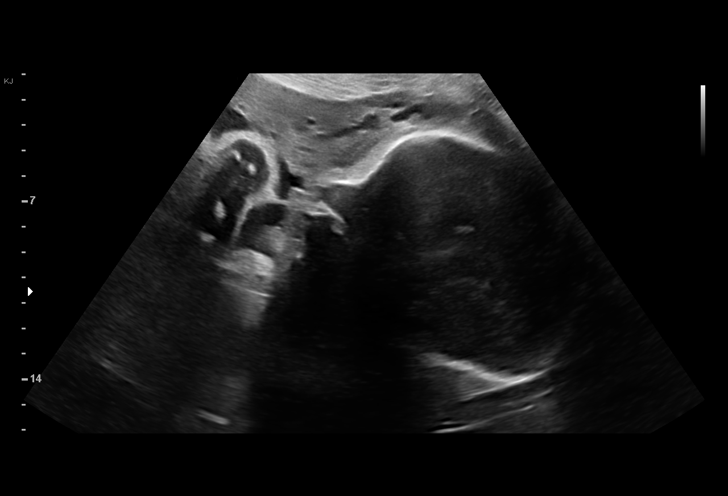
[im 30/33]
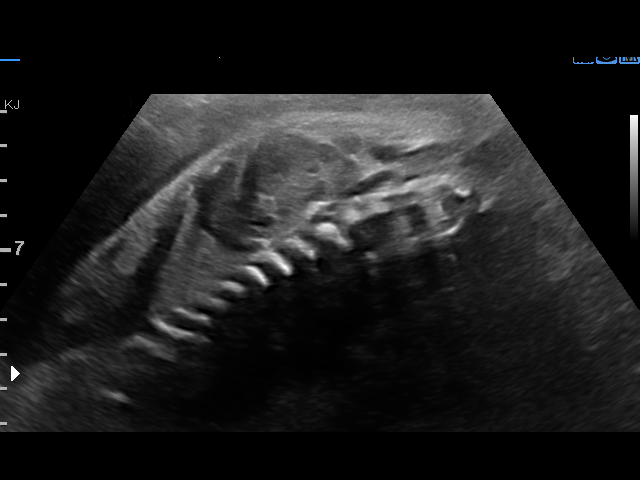
[im 33/33]
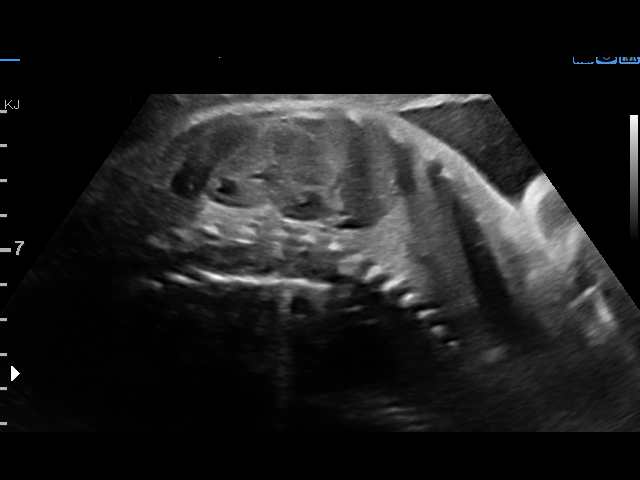

[15 of 28 positions shown; findings below may reference images not displayed]

Indications

 33 weeks gestation of pregnancy
 Pre-existing essential hypertension
 complicating pregnancy, third trimester
 Obesity complicating pregnancy, third
 trimester
Fetal Evaluation

 Num Of Fetuses:         1
 Fetal Heart Rate(bpm):  143
 Cardiac Activity:       Observed
 Presentation:           Cephalic
 Placenta:               Posterior
 P. Cord Insertion:      Not well visualized

 Amniotic Fluid
 AFI FV:      Within normal limits

 AFI Sum(cm)     %Tile       Largest Pocket(cm)
 10.58           22

 RUQ(cm)       RLQ(cm)       LUQ(cm)        LLQ(cm)
 6.18          2.54          1.86           0
Biophysical Evaluation
 Amniotic F.V:   Within normal limits       F. Tone:        Observed
 F. Movement:    Observed                   Score:          [DATE]
 F. Breathing:   Observed
Biometry

 BPD:      83.3  mm     G. Age:  33w 4d         59  %    CI:         73.1   %    70 - 86
                                                         FL/HC:      20.9   %    19.9 -
 HC:      309.7  mm     G. Age:  34w 4d         55  %    HC/AC:      0.98        0.96 -
 AC:      316.8  mm     G. Age:  35w 4d         98  %    FL/BPD:     77.6   %    71 - 87
 FL:       64.6  mm     G. Age:  33w 2d         48  %    FL/AC:      20.4   %    20 - 24

 Est. FW:    6690  gm      5 lb 8 oz     88  %
OB History

 Gravidity:    6         Term:   5        Prem:   0        SAB:   1
 TOP:          0       Ectopic:  0        Living: 5
Gestational Age

 LMP:           31w 5d        Date:  06/01/20                 EDD:   03/08/21
 U/S Today:     34w 2d                                        EDD:   02/18/21
 Best:          33w 0d     Det. By:  Early Ultrasound         EDD:   02/27/21
                                     (07/09/20)
Anatomy

 Diaphragm:             Appears normal         Kidneys:                Appear normal
 Stomach:               Appears normal, left   Bladder:                Appears normal
                        sided
Impression

 Chronic hypertension. Well-controlled on labetalol.
 BP at our office: 133/71 mm Hg .

 Fetal growth is appropriate for gestational age .Amniotic fluid
 is normal and good fetal activity is seen .Antenatal testing is
 reassuring. BPP [DATE].
Recommendations

 -Continue weekly BPP till delivery.
                 Hussein Hazama, [place]

## 2021-10-25 IMAGING — US US MFM FETAL BPP W/O NON-STRESS
1 series · 14 of 28 positions shown · non-contrast
Comparison: none

[Series 1: us mfm fetal bpp w/o non-stress · 34 acquisitions, 14 frames shown]
[im 2/34]
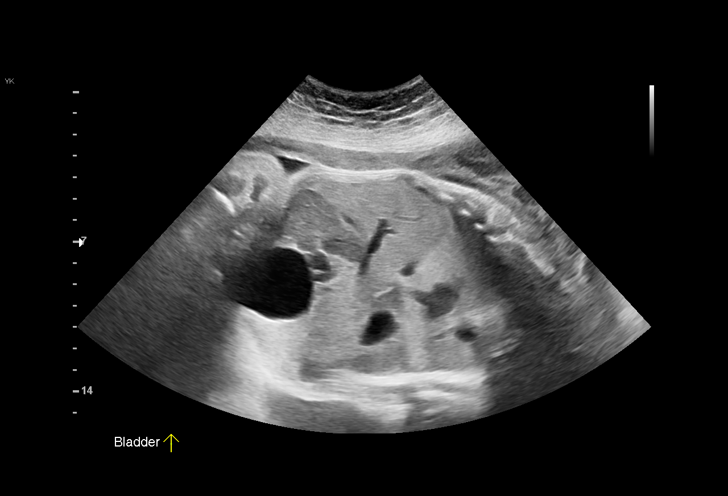
[im 4/34]
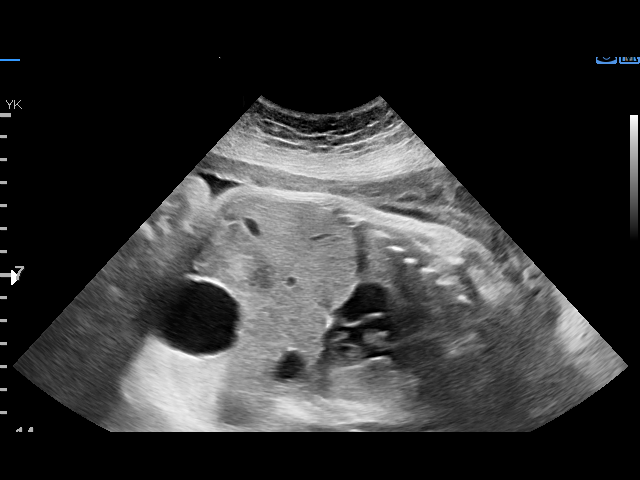
[im 7/34]
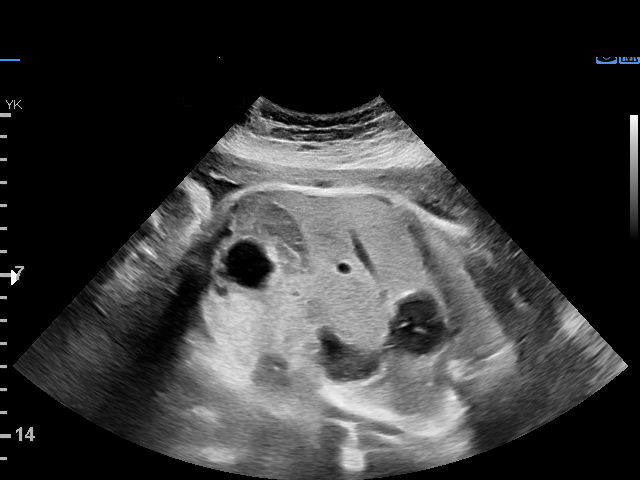
[im 9/34]
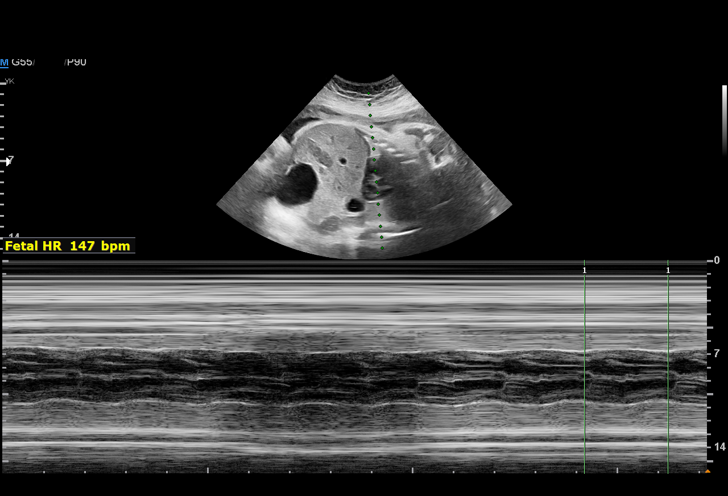
[im 12/34]
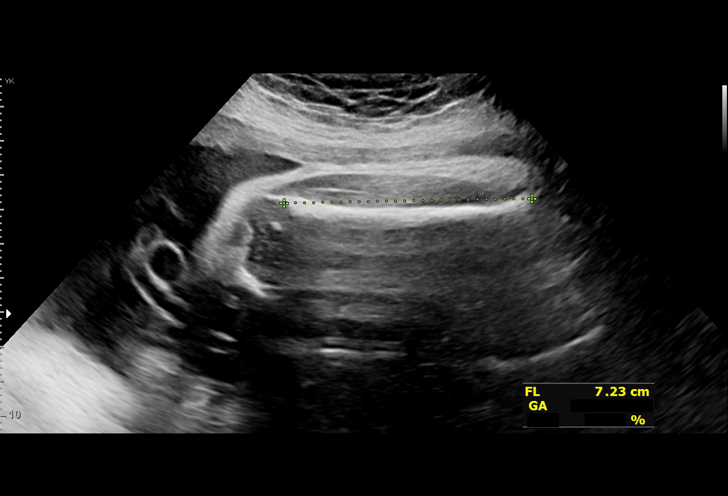
[im 14/34]
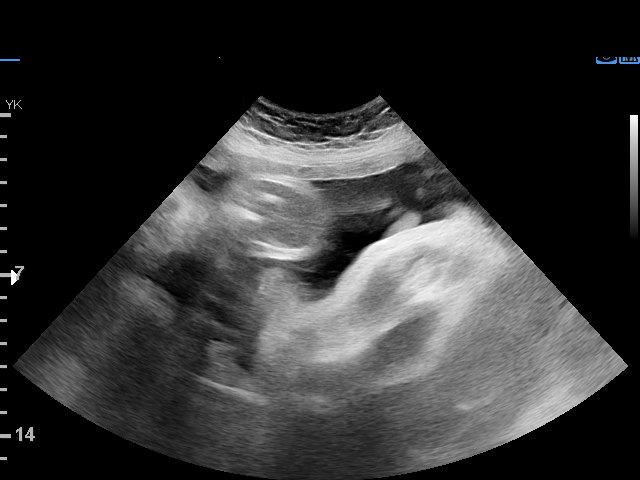
[im 16/34]
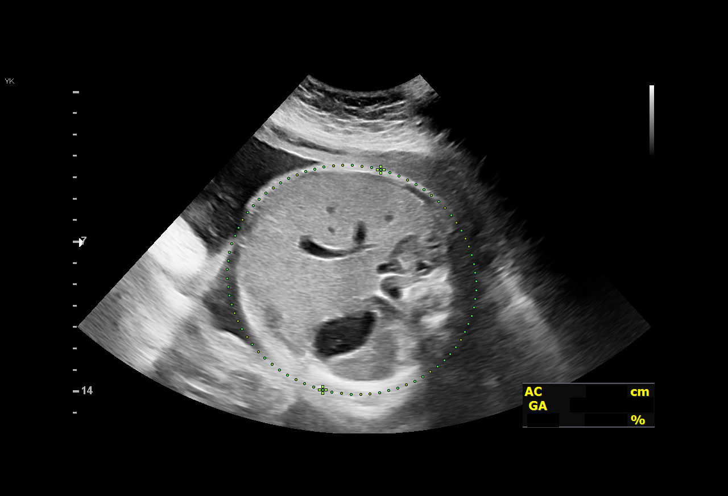
[im 19/34]
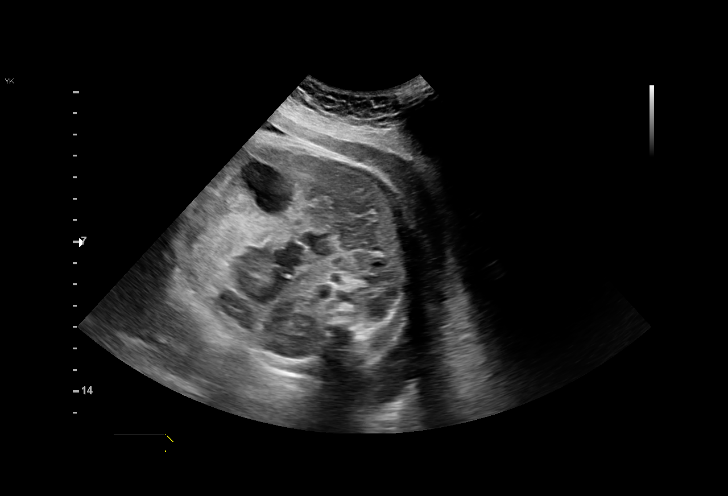
[im 21/34]
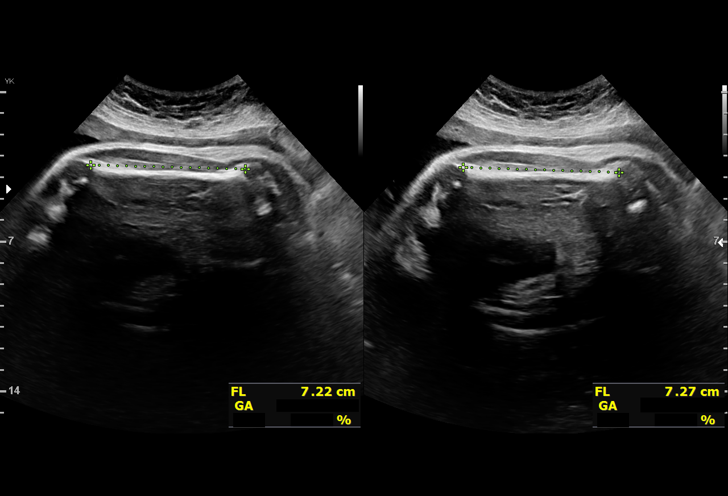
[im 24/34]
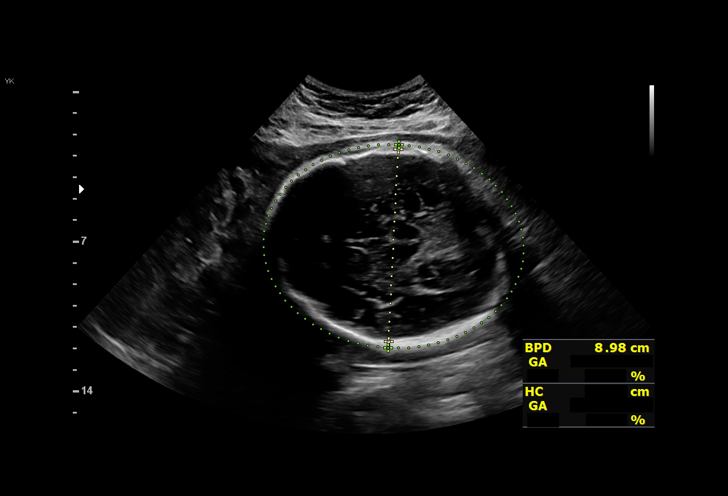
[im 26/34]
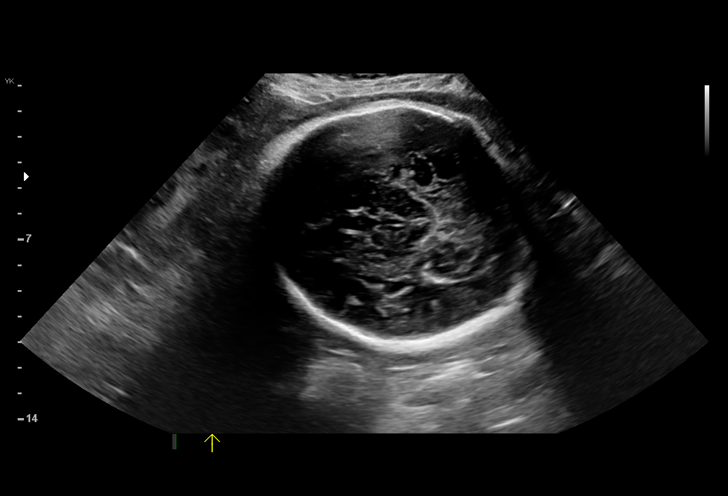
[im 29/34]
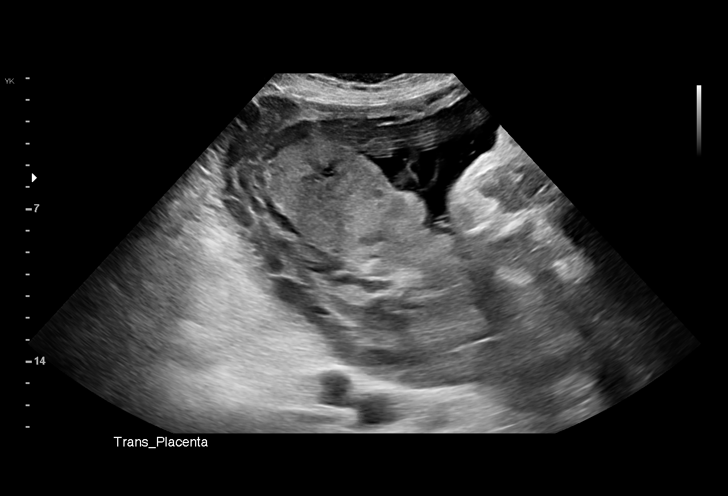
[im 31/34]
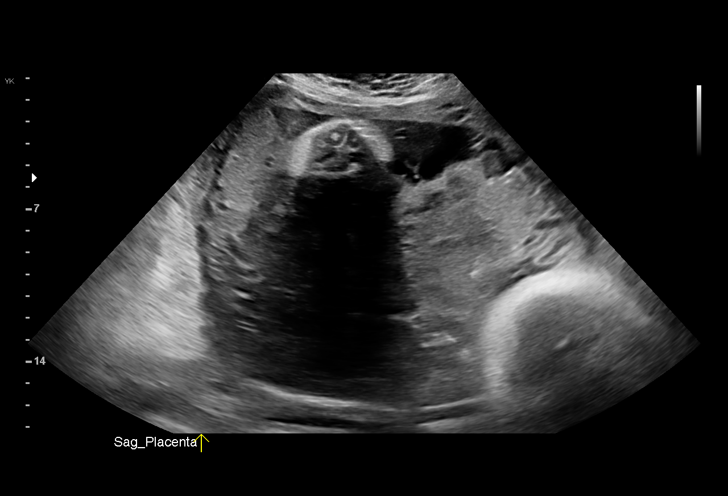
[im 34/34]
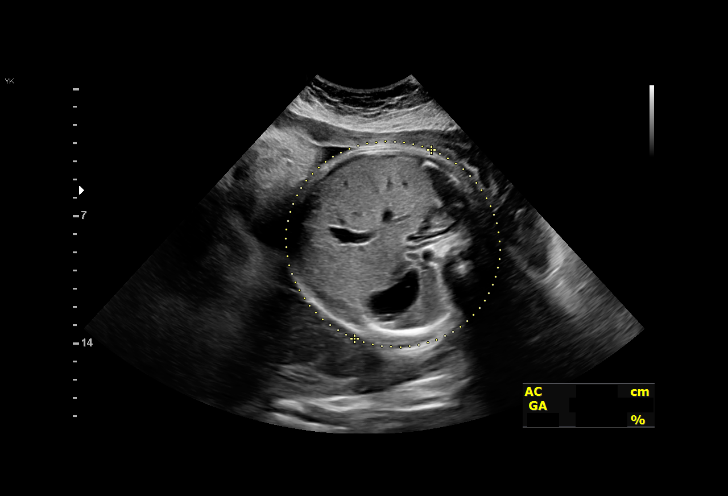

[14 of 28 positions shown; findings below may reference images not displayed]

Indications

 37 weeks gestation of pregnancy
 Hypertension - Chronic/Pre-existing (on
 labetolol)
 Advanced maternal age multigravida 35+,
 third trimester
Fetal Evaluation

 Num Of Fetuses:         1
 Fetal Heart Rate(bpm):  139
 Cardiac Activity:       Observed
 Presentation:           Cephalic
 Placenta:               Posterior
 P. Cord Insertion:      Previously Visualized

 Amniotic Fluid
 AFI FV:      Within normal limits

 AFI Sum(cm)     %Tile       Largest Pocket(cm)
 15.04           57

 RUQ(cm)       RLQ(cm)       LUQ(cm)        LLQ(cm)

Biophysical Evaluation
 Amniotic F.V:   Within normal limits       F. Tone:        Observed
 F. Movement:    Observed                   Score:          [DATE]
 F. Breathing:   Observed
Biometry

 BPD:      90.8  mm     G. Age:  36w 6d         52  %    CI:        72.09   %    70 - 86
                                                         FL/HC:      21.3   %    20.8 -
 HC:      340.3  mm     G. Age:  39w 1d         69  %    HC/AC:      0.93        0.92 -
 AC:      365.9  mm     G. Age:  40w 4d       > 99  %    FL/BPD:     79.7   %    71 - 87
 FL:       72.4  mm     G. Age:  37w 0d         41  %    FL/AC:      19.8   %    20 - 24

 Est. FW:    8071  gm      8 lb 3 oz     93  %
OB History

 Gravidity:    6         Term:   5        Prem:   0        SAB:   1
 TOP:          0       Ectopic:  0        Living: 5
Gestational Age

 LMP:           36w 1d        Date:  06/01/20                 EDD:   03/08/21
 U/S Today:     38w 3d                                        EDD:   02/20/21
 Best:          37w 3d     Det. By:  Early Ultrasound         EDD:   02/27/21
                                     (07/09/20)
Anatomy

 Cranium:               Appears normal         LVOT:                   Previously seen
 Cavum:                 Appears normal         Aortic Arch:            Previously seen
 Ventricles:            Appears normal         Ductal Arch:            Previously seen
 Choroid Plexus:        Previously seen        Diaphragm:              Previously seen
 Cerebellum:            Previously seen        Stomach:                Appears normal, left
                                                                       sided
 Posterior Fossa:       Previously seen        Abdomen:                Appears normal
 Nuchal Fold:           Previously seen        Abdominal Wall:         Previously seen
 Face:                  Appears normal         Cord Vessels:           Previously seen
                        (orbits and profile)
 Lips:                  Previously seen        Kidneys:                Appear normal
 Palate:                Previously seen        Bladder:                Appears normal
 Thoracic:              Previously seen        Spine:                  Previously seen
 Heart:                 Previously seen        Upper Extremities:      Previously seen
 RVOT:                  Previously seen        Lower Extremities:      Previously seen

 Other:  Fetus appears to be female. Heels and 5th digit previously seen. SVC
         IVC, 3VV/T prev seen. Technicallly difficult due to advanced GA and
         maternal habitus.
Impression

 Fetal growth is appropriate for gestational age (EFW at the
 93rd percentile) Amniotic fluid is normal and good fetal
 activity is seen .Antenatal testing is reassuring. BPP [DATE].
 Cephalic presentation.
 IOL on 02/15/21.
                 Gonzo, Jerri
